# Patient Record
Sex: Female | Born: 1965 | Race: Black or African American | Hispanic: No | Marital: Single | Smoking: Never smoker
Health system: Southern US, Community
[De-identification: ages and names within clinical notes are randomized; demographics above are authoritative.]

## PROBLEM LIST (undated history)

## (undated) DIAGNOSIS — I471 Supraventricular tachycardia, unspecified: Secondary | ICD-10-CM

## (undated) DIAGNOSIS — D259 Leiomyoma of uterus, unspecified: Secondary | ICD-10-CM

## (undated) DIAGNOSIS — I10 Essential (primary) hypertension: Secondary | ICD-10-CM

## (undated) DIAGNOSIS — M549 Dorsalgia, unspecified: Secondary | ICD-10-CM

## (undated) DIAGNOSIS — B019 Varicella without complication: Secondary | ICD-10-CM

## (undated) DIAGNOSIS — D500 Iron deficiency anemia secondary to blood loss (chronic): Secondary | ICD-10-CM

## (undated) DIAGNOSIS — N3090 Cystitis, unspecified without hematuria: Secondary | ICD-10-CM

## (undated) HISTORY — DX: Dorsalgia, unspecified: M54.9

## (undated) HISTORY — PX: OTHER SURGICAL HISTORY: SHX169

## (undated) HISTORY — PX: ABDOMINAL HYSTERECTOMY: SHX81

## (undated) HISTORY — DX: Cystitis, unspecified without hematuria: N30.90

## (undated) HISTORY — DX: Varicella without complication: B01.9

---

## 2003-03-21 ENCOUNTER — Other Ambulatory Visit: Payer: Self-pay

## 2004-03-11 ENCOUNTER — Emergency Department: Payer: Self-pay | Admitting: Emergency Medicine

## 2004-12-17 ENCOUNTER — Emergency Department: Payer: Self-pay | Admitting: Emergency Medicine

## 2004-12-17 ENCOUNTER — Other Ambulatory Visit: Payer: Self-pay

## 2005-01-09 ENCOUNTER — Other Ambulatory Visit: Payer: Self-pay

## 2005-01-09 ENCOUNTER — Emergency Department: Payer: Self-pay | Admitting: Internal Medicine

## 2005-12-13 ENCOUNTER — Emergency Department: Payer: Self-pay

## 2006-12-14 ENCOUNTER — Emergency Department: Payer: Self-pay | Admitting: Emergency Medicine

## 2006-12-14 ENCOUNTER — Other Ambulatory Visit: Payer: Self-pay

## 2007-06-02 ENCOUNTER — Emergency Department: Payer: Self-pay | Admitting: Emergency Medicine

## 2007-07-18 ENCOUNTER — Emergency Department: Payer: Self-pay | Admitting: Emergency Medicine

## 2008-10-04 ENCOUNTER — Encounter (INDEPENDENT_AMBULATORY_CARE_PROVIDER_SITE_OTHER): Payer: Self-pay | Admitting: *Deleted

## 2010-08-30 ENCOUNTER — Encounter: Payer: Self-pay | Admitting: Cardiovascular Disease

## 2010-09-23 ENCOUNTER — Observation Stay: Payer: Self-pay | Admitting: Internal Medicine

## 2010-10-10 ENCOUNTER — Ambulatory Visit: Payer: Self-pay | Admitting: Internal Medicine

## 2011-10-20 ENCOUNTER — Ambulatory Visit: Payer: Self-pay | Admitting: General Surgery

## 2011-10-20 LAB — POTASSIUM: Potassium: 3.4 mmol/L — ABNORMAL LOW (ref 3.5–5.1)

## 2011-10-20 LAB — PREGNANCY, URINE: Pregnancy Test, Urine: NEGATIVE m[IU]/mL

## 2011-11-28 ENCOUNTER — Emergency Department: Payer: Self-pay | Admitting: Emergency Medicine

## 2011-11-28 LAB — COMPREHENSIVE METABOLIC PANEL
Albumin: 3.5 g/dL (ref 3.4–5.0)
Anion Gap: 6 — ABNORMAL LOW (ref 7–16)
Bilirubin,Total: 0.3 mg/dL (ref 0.2–1.0)
Calcium, Total: 8.7 mg/dL (ref 8.5–10.1)
Co2: 27 mmol/L (ref 21–32)
EGFR (Non-African Amer.): >60
Glucose: 136 mg/dL — ABNORMAL HIGH (ref 65–99)
Osmolality: 279 (ref 275–301)
Potassium: 3.5 mmol/L (ref 3.5–5.1)
SGOT(AST): 20 U/L (ref 15–37)
SGPT (ALT): 29 U/L (ref 12–78)
Sodium: 139 mmol/L (ref 136–145)

## 2011-11-28 LAB — URINALYSIS, COMPLETE
Bilirubin,UR: NEGATIVE
Glucose,UR: NEGATIVE mg/dL (ref 0–75)
Ketone: NEGATIVE
Nitrite: NEGATIVE
Ph: 6 (ref 4.5–8.0)
Specific Gravity: 1.012 (ref 1.003–1.030)
Squamous Epithelial: 1

## 2011-11-28 LAB — CBC
HGB: 13.1 g/dL (ref 12.0–16.0)
MCH: 27.6 pg (ref 26.0–34.0)
MCHC: 32.8 g/dL (ref 32.0–36.0)
Platelet: 324 10*3/uL (ref 150–440)
RBC: 4.74 10*6/uL (ref 3.80–5.20)
RDW: 15.3 % — ABNORMAL HIGH (ref 11.5–14.5)

## 2011-12-13 ENCOUNTER — Emergency Department: Payer: Self-pay | Admitting: Emergency Medicine

## 2012-04-08 ENCOUNTER — Ambulatory Visit: Payer: Self-pay | Admitting: Family Medicine

## 2012-04-08 DIAGNOSIS — Z0289 Encounter for other administrative examinations: Secondary | ICD-10-CM

## 2013-08-20 ENCOUNTER — Emergency Department: Payer: Self-pay | Admitting: Emergency Medicine

## 2013-08-20 LAB — COMPREHENSIVE METABOLIC PANEL
Albumin: 3.6 g/dL (ref 3.4–5.0)
Alkaline Phosphatase: 72 U/L
Anion Gap: 7 (ref 7–16)
BUN: 9 mg/dL (ref 7–18)
Bilirubin,Total: 0.2 mg/dL (ref 0.2–1.0)
Calcium, Total: 8.6 mg/dL (ref 8.5–10.1)
Chloride: 108 mmol/L — ABNORMAL HIGH (ref 98–107)
Co2: 24 mmol/L (ref 21–32)
Creatinine: 0.92 mg/dL (ref 0.60–1.30)
EGFR (African American): >60
GLUCOSE: 109 mg/dL — AB (ref 65–99)
Osmolality: 277 (ref 275–301)
Potassium: 3.7 mmol/L (ref 3.5–5.1)
SGOT(AST): 25 U/L (ref 15–37)
SGPT (ALT): 16 U/L (ref 12–78)
SODIUM: 139 mmol/L (ref 136–145)
Total Protein: 7.6 g/dL (ref 6.4–8.2)

## 2013-08-20 LAB — URINALYSIS, COMPLETE
BILIRUBIN, UR: NEGATIVE
GLUCOSE, UR: NEGATIVE mg/dL (ref 0–75)
Leukocyte Esterase: NEGATIVE
NITRITE: NEGATIVE
Ph: 5 (ref 4.5–8.0)
Protein: 30
Specific Gravity: 1.04 (ref 1.003–1.030)
Squamous Epithelial: 5

## 2013-08-20 LAB — CBC WITH DIFFERENTIAL/PLATELET
Basophil #: 0 10*3/uL (ref 0.0–0.1)
Basophil %: 0.9 %
EOS PCT: 1.6 %
Eosinophil #: 0.1 10*3/uL (ref 0.0–0.7)
HCT: 36.8 % (ref 35.0–47.0)
HGB: 11.4 g/dL — ABNORMAL LOW (ref 12.0–16.0)
LYMPHS PCT: 44.3 %
Lymphocyte #: 2.4 10*3/uL (ref 1.0–3.6)
MCH: 24.7 pg — ABNORMAL LOW (ref 26.0–34.0)
MCHC: 31.1 g/dL — AB (ref 32.0–36.0)
MCV: 80 fL (ref 80–100)
MONOS PCT: 6.7 %
Monocyte #: 0.4 x10 3/mm (ref 0.2–0.9)
NEUTROS PCT: 46.5 %
Neutrophil #: 2.5 10*3/uL (ref 1.4–6.5)
PLATELETS: 338 10*3/uL (ref 150–440)
RBC: 4.62 10*6/uL (ref 3.80–5.20)
RDW: 18.1 % — ABNORMAL HIGH (ref 11.5–14.5)
WBC: 5.3 10*3/uL (ref 3.6–11.0)

## 2013-08-20 LAB — LIPASE, BLOOD: Lipase: 147 U/L (ref 73–393)

## 2013-08-21 LAB — GC/CHLAMYDIA PROBE AMP

## 2013-08-21 LAB — WET PREP, GENITAL

## 2014-03-23 ENCOUNTER — Emergency Department: Payer: Self-pay | Admitting: Emergency Medicine

## 2014-03-23 LAB — CBC
HCT: 36.1 % (ref 35.0–47.0)
HGB: 11.4 g/dL — AB (ref 12.0–16.0)
MCH: 25.4 pg — ABNORMAL LOW (ref 26.0–34.0)
MCHC: 31.7 g/dL — AB (ref 32.0–36.0)
MCV: 80 fL (ref 80–100)
Platelet: 351 10*3/uL (ref 150–440)
RBC: 4.51 10*6/uL (ref 3.80–5.20)
RDW: 22.9 % — ABNORMAL HIGH (ref 11.5–14.5)
WBC: 5.7 10*3/uL (ref 3.6–11.0)

## 2014-03-23 LAB — URINALYSIS, COMPLETE
BILIRUBIN, UR: NEGATIVE
Bacteria: NONE SEEN
Glucose,UR: NEGATIVE mg/dL (ref 0–75)
KETONE: NEGATIVE
Leukocyte Esterase: NEGATIVE
Nitrite: NEGATIVE
Ph: 5 (ref 4.5–8.0)
Protein: 30
RBC,UR: 3274 /HPF (ref 0–5)
Specific Gravity: 1.036 (ref 1.003–1.030)

## 2014-03-23 LAB — BASIC METABOLIC PANEL
ANION GAP: 6 — AB (ref 7–16)
BUN: 8 mg/dL (ref 7–18)
CO2: 28 mmol/L (ref 21–32)
CREATININE: 0.78 mg/dL (ref 0.60–1.30)
Calcium, Total: 8.2 mg/dL — ABNORMAL LOW (ref 8.5–10.1)
Chloride: 107 mmol/L (ref 98–107)
EGFR (Non-African Amer.): >60
GLUCOSE: 64 mg/dL — AB (ref 65–99)
Osmolality: 278 (ref 275–301)
Potassium: 3.3 mmol/L — ABNORMAL LOW (ref 3.5–5.1)
SODIUM: 141 mmol/L (ref 136–145)

## 2014-07-04 NOTE — Op Note (Signed)
PATIENT NAME:  Kaitlin Brown, Kaitlin Brown MR#:  [id number] DATE OF BIRTH:  22-Jan-1966  DATE OF PROCEDURE:  10/20/2011  PREOPERATIVE DIAGNOSIS: Umbilical hernia.   POSTOPERATIVE DIAGNOSIS: Umbilical hernia.   PROCEDURE: Repair of umbilical hernia primary repair.   SURGEON: S.G. Jamal Collin, MD  ANESTHESIA: Monitored care with use of local anesthetic of 0.5% Marcaine mixed with 1% Xylocaine.   COMPLICATIONS: None.   ESTIMATED BLOOD LOSS: Minimal.   DRAINS: None.   DESCRIPTION OF PROCEDURE: Patient was placed in supine position on the operating table. The abdomen around the umbilicus was prepped and draped out as a sterile field. With adequate IV sedation monitoring, local anesthetic was instilled overlying the upper lip of the umbilicus where the hernia was palpated. The skin incision was then made along the superior aspect of the umbilicus transversely and carefully deepened through and dissected out to expose what appeared to be a less than 1 cm size fascial defect with what appeared to be just primarily preperitoneal tissue and a small sac. This was excised out and palpation through the fascial defect with the little finger inserted all around showed no other defects. The defect was then primarily closed with two stitches of 0 Prolene and did not require any occasional reinforcement. Subcutaneous tissue was closed with 3-0 Vicryl and the skin closed with subcuticular 4-0 Vicryl, covered with Dermabond. Procedure was well tolerated. She was subsequently returned to recovery room in stable condition.  ____________________________ S.Robinette Haines, MD sgs:cms D: 10/20/2011 08:27:08 ET T: 10/20/2011 10:03:57 ET  JOB#: [id number] cc: Synthia Innocent. Jamal Collin, MD, <Dictator> [facility] Robinette Haines MD ELECTRONICALLY SIGNED 10/20/2011 14:51

## 2014-07-20 ENCOUNTER — Emergency Department
Admission: EM | Admit: 2014-07-20 | Discharge: 2014-07-20 | Disposition: A | Discharge status: Home / Self Care | Payer: Self-pay | Attending: Emergency Medicine | Admitting: Emergency Medicine

## 2014-07-20 ENCOUNTER — Encounter: Payer: Self-pay | Admitting: *Deleted

## 2014-07-20 DIAGNOSIS — K2970 Gastritis, unspecified, without bleeding: Secondary | ICD-10-CM | POA: Insufficient documentation

## 2014-07-20 DIAGNOSIS — Z79899 Other long term (current) drug therapy: Secondary | ICD-10-CM | POA: Insufficient documentation

## 2014-07-20 DIAGNOSIS — I10 Essential (primary) hypertension: Secondary | ICD-10-CM | POA: Insufficient documentation

## 2014-07-20 DIAGNOSIS — Z3202 Encounter for pregnancy test, result negative: Secondary | ICD-10-CM | POA: Insufficient documentation

## 2014-07-20 HISTORY — DX: Essential (primary) hypertension: I10

## 2014-07-20 LAB — COMPREHENSIVE METABOLIC PANEL
ALBUMIN: 4.3 g/dL (ref 3.5–5.0)
ALK PHOS: 65 U/L (ref 38–126)
ALT: 18 U/L (ref 14–54)
AST: 20 U/L (ref 15–41)
Anion gap: 8 (ref 5–15)
BILIRUBIN TOTAL: 0.6 mg/dL (ref 0.3–1.2)
BUN: 8 mg/dL (ref 6–20)
CHLORIDE: 103 mmol/L (ref 101–111)
CO2: 24 mmol/L (ref 22–32)
Calcium: 9 mg/dL (ref 8.9–10.3)
Creatinine, Ser: 0.61 mg/dL (ref 0.44–1.00)
GFR calc Af Amer: >60 mL/min (ref >60)
GFR calc non Af Amer: >60 mL/min (ref >60)
Glucose, Bld: 109 mg/dL — ABNORMAL HIGH (ref 65–99)
Potassium: 3.6 mmol/L (ref 3.5–5.1)
Sodium: 135 mmol/L (ref 135–145)
Total Protein: 8.1 g/dL (ref 6.5–8.1)

## 2014-07-20 LAB — CBC WITH DIFFERENTIAL/PLATELET
Basophils Absolute: 0 10*3/uL (ref 0–0.1)
Basophils Relative: 1 %
Eosinophils Absolute: 0.1 10*3/uL (ref 0–0.7)
Eosinophils Relative: 2 %
HCT: 35.2 % (ref 35.0–47.0)
HEMOGLOBIN: 10.9 g/dL — AB (ref 12.0–16.0)
Lymphocytes Relative: 36 %
Lymphs Abs: 1.9 10*3/uL (ref 1.0–3.6)
MCH: 22.7 pg — ABNORMAL LOW (ref 26.0–34.0)
MCHC: 30.9 g/dL — AB (ref 32.0–36.0)
MCV: 73.4 fL — ABNORMAL LOW (ref 80.0–100.0)
MONOS PCT: 10 %
Monocytes Absolute: 0.5 10*3/uL (ref 0.2–0.9)
NEUTROS PCT: 51 %
Neutro Abs: 2.8 10*3/uL (ref 1.4–6.5)
PLATELETS: 507 10*3/uL — AB (ref 150–440)
RBC: 4.79 MIL/uL (ref 3.80–5.20)
RDW: 16.9 % — ABNORMAL HIGH (ref 11.5–14.5)
WBC: 5.3 10*3/uL (ref 3.6–11.0)

## 2014-07-20 LAB — URINALYSIS COMPLETE WITH MICROSCOPIC (ARMC ONLY)
Bilirubin Urine: NEGATIVE
Glucose, UA: NEGATIVE mg/dL
HGB URINE DIPSTICK: NEGATIVE
LEUKOCYTES UA: NEGATIVE
NITRITE: NEGATIVE
PH: 5 (ref 5.0–8.0)
PROTEIN: NEGATIVE mg/dL
SPECIFIC GRAVITY, URINE: 1.023 (ref 1.005–1.030)

## 2014-07-20 LAB — LIPASE, BLOOD: Lipase: 30 U/L (ref 22–51)

## 2014-07-20 MED ORDER — ONDANSETRON HCL 4 MG PO TABS: 4.0000 mg | ORAL_TABLET | Freq: Every day | ORAL | Status: DC | PRN

## 2014-07-20 NOTE — ED Notes (Signed)
Pt informed to return if life threatening symptoms occur.   

## 2014-07-20 NOTE — Discharge Instructions (Signed)
Gastritis, Adult °Gastritis is soreness and puffiness (inflammation) of the lining of the stomach. If you do not get help, gastritis can cause bleeding and sores (ulcers) in the stomach. °HOME CARE  °· Only take medicine as told by your doctor. °· If you were given antibiotic medicines, take them as told. Finish the medicines even if you start to feel better. °· Drink enough fluids to keep your pee (urine) clear or pale yellow. °· Avoid foods and drinks that make your problems worse. Foods you may want to avoid include: °¨ Caffeine or alcohol. °¨ Chocolate. °¨ Mint. °¨ Garlic and onions. °¨ Spicy foods. °¨ Citrus fruits, including oranges, lemons, or limes. °¨ Food containing tomatoes, including sauce, chili, salsa, and pizza. °¨ Fried and fatty foods. °· Eat small meals throughout the day instead of large meals. °GET HELP RIGHT AWAY IF:  °· You have black or dark red poop (stools). °· You throw up (vomit) blood. It may look like coffee grounds. °· You cannot keep fluids down. °· Your belly (abdominal) pain gets worse. °· You have a fever. °· You do not feel better after 1 week. °· You have any other questions or concerns. °MAKE SURE YOU:  °· Understand these instructions. °· Will watch your condition. °· Will get help right away if you are not doing well or get worse. °Document Released: 08/20/2007 Document Revised: 05/26/2011 Document Reviewed: 04/16/2011 °ExitCare® Patient Information ©2015 [employer]. This information is not intended to replace advice given to you by your health care provider. Make sure you discuss any questions you have with your health care provider. ° °

## 2014-07-20 NOTE — ED Provider Notes (Signed)
[facility] Emergency Department Provider Note    ____________________________________________  Time seen: 2 PM  I have reviewed the triage vital signs and the nursing notes.   HISTORY  Chief Complaint Abdominal Pain    HPI Kaitlin Brown is a 49 y.o. female who presents with vague diffuse and mild abdominal cramping for 2 days with occasional episodes of nausea. She denies vomiting. Currently she has no discomfort and no nausea she feels very well. She has no diarrhea her stools are normal. He has no history of the same. She has not taken any medications for this. When she had the discomfort food made it worse however she has none now.     Past Medical History  Diagnosis Date  . Back pain   . Bladder infection   . Chicken pox   . Hypertension     There are no active problems to display for this patient.   History reviewed. No pertinent past surgical history.  Current Outpatient Rx  Name  Route  Sig  Dispense  Refill  . lisinopril-hydrochlorothiazide (PRINZIDE,ZESTORETIC) 10-12.5 MG per tablet   Oral   Take 1 tablet by mouth daily.           Allergies Review of patient's allergies indicates no known allergies.  Family History  Problem Relation Age of Onset  . Hypertension Mother   . Diabetes Mother   . Glaucoma Mother   . Heart disease Father     Congestive heart condition    Social History History  Substance Use Topics  . Smoking status: Never Smoker   . Smokeless tobacco: Not on file  . Alcohol Use: Yes     Comment: occasionally    Review of Systems  Constitutional: Negative for fever. Eyes: Negative for visual changes. ENT: Negative for sore throat. Cardiovascular: Negative for chest pain. Respiratory: Negative for shortness of breath. Gastrointestinal: Negative for abdominal pain, vomiting and diarrhea currently Genitourinary: Negative for dysuria. Musculoskeletal: Negative for back pain. Skin:  Negative for rash. Neurological: Negative for headaches, focal weakness or numbness.   10-point ROS otherwise negative.  ____________________________________________   PHYSICAL EXAM:  VITAL SIGNS: ED Triage Vitals  Enc Vitals Group     BP 07/20/14 1250 146/85 mmHg     Pulse Rate 07/20/14 1250 94     Resp --      Temp 07/20/14 1250 98.2 F (36.8 C)     Temp Source 07/20/14 1250 Oral     SpO2 07/20/14 1250 94 %     Weight 07/20/14 1250 225 lb (102.059 kg)     Height 07/20/14 1250 5\' 6"  (1.676 m)     Head Cir --      Peak Flow --      Pain Score 07/20/14 1245 0     Pain Loc --      Pain Edu? --      Excl. in [place]? --      Constitutional: Alert and oriented. Well appearing and in no distress. Eyes: Conjunctivae are normal. PERRL. Normal extraocular movements. ENT   Head: Normocephalic and atraumatic.   Nose: No congestion/rhinnorhea.   Mouth/Throat: Mucous membranes are moist.   Neck: No stridor. Hematological/Lymphatic/Immunilogical: No cervical lymphadenopathy. Cardiovascular: Normal rate, regular rhythm. Normal and symmetric distal pulses are present in all extremities. No murmurs, rubs, or gallops. Respiratory: Normal respiratory effort without tachypnea nor retractions. Breath sounds are clear and equal bilaterally. No wheezes/rales/rhonchi. Gastrointestinal: Soft and nontender. No distention. There is no CVA  tenderness. Genitourinary: Deferred Musculoskeletal: Nontender with normal range of motion in all extremities. No joint effusions.  No lower extremity tenderness nor edema. Neurologic:  Normal speech and language. No gross focal neurologic deficits are appreciated. Speech is normal. No gait instability. Skin:  Skin is warm, dry and intact. No rash noted. Psychiatric: Mood and affect are normal. Speech and behavior are normal. Patient exhibits appropriate insight and judgment.  ____________________________________________    LABS (pertinent  positives/negatives)  Benign  ____________________________________________   EKG  None  ____________________________________________    RADIOLOGY  None  ____________________________________________   PROCEDURES  Procedure(s) performed: None  Critical Care performed: No  ____________________________________________   INITIAL IMPRESSION / ASSESSMENT AND PLAN / ED COURSE  Pertinent labs & imaging results that were available during my care of the patient were reviewed by me and considered in my medical decision making (see chart for details).  Initial impression is the patient is very well-appearing has no tenderness to palpation. Given that she has no physical complaints in the ED today if labs look normal she will be discharged for follow up primary care physician.  ____________________________________________   FINAL CLINICAL IMPRESSION(S) / ED DIAGNOSES  Final diagnoses:  Gastritis     Lavonia Drafts, MD 07/20/14 [phone]

## 2014-07-20 NOTE — ED Notes (Addendum)
Pt complains of diffuse abdominal pain with nausea times two days

## 2014-07-22 LAB — POCT PREGNANCY, URINE: Preg Test, Ur: NEGATIVE

## 2014-09-18 ENCOUNTER — Encounter: Payer: Self-pay | Admitting: Emergency Medicine

## 2014-09-18 ENCOUNTER — Emergency Department: Payer: Self-pay

## 2014-09-18 ENCOUNTER — Emergency Department
Admission: EM | Admit: 2014-09-18 | Discharge: 2014-09-18 | Disposition: A | Discharge status: Home / Self Care | Payer: Self-pay

## 2014-09-18 ENCOUNTER — Emergency Department
Admission: EM | Admit: 2014-09-18 | Discharge: 2014-09-18 | Disposition: A | Discharge status: Home / Self Care | Payer: Self-pay | Attending: Emergency Medicine | Admitting: Emergency Medicine

## 2014-09-18 DIAGNOSIS — Y9389 Activity, other specified: Secondary | ICD-10-CM | POA: Insufficient documentation

## 2014-09-18 DIAGNOSIS — S60032A Contusion of left middle finger without damage to nail, initial encounter: Secondary | ICD-10-CM | POA: Insufficient documentation

## 2014-09-18 DIAGNOSIS — Z79899 Other long term (current) drug therapy: Secondary | ICD-10-CM | POA: Insufficient documentation

## 2014-09-18 DIAGNOSIS — Y9289 Other specified places as the place of occurrence of the external cause: Secondary | ICD-10-CM | POA: Insufficient documentation

## 2014-09-18 DIAGNOSIS — R52 Pain, unspecified: Secondary | ICD-10-CM

## 2014-09-18 DIAGNOSIS — I10 Essential (primary) hypertension: Secondary | ICD-10-CM | POA: Insufficient documentation

## 2014-09-18 DIAGNOSIS — Y998 Other external cause status: Secondary | ICD-10-CM | POA: Insufficient documentation

## 2014-09-18 MED ORDER — CEPHALEXIN 500 MG PO CAPS: 500.0000 mg | ORAL_CAPSULE | Freq: Three times a day (TID) | ORAL | Status: AC

## 2014-09-18 MED ORDER — IBUPROFEN 800 MG PO TABS: 800.0000 mg | ORAL_TABLET | Freq: Three times a day (TID) | ORAL | Status: DC | PRN

## 2014-09-18 NOTE — ED Notes (Signed)
Pt called for triage. No answer.

## 2014-09-18 NOTE — ED Notes (Signed)
Pt called for traige no answer
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
## 2014-09-18 NOTE — ED Notes (Addendum)
Pt called for triage. No answer.

## 2014-09-18 NOTE — Discharge Instructions (Signed)
Take medication as prescribed. Keep clean. Apply ice and elevate.wear finger splint for 2-3 days or as long as pain continues.   Follow-up with primary care physician or orthopedic as needed for continued pain. Return to ER for new or worsening concerns.  Contusion A contusion is a deep bruise. Contusions are the result of an injury that caused bleeding under the skin. The contusion may turn blue, purple, or yellow. Minor injuries will give you a painless contusion, but more severe contusions may stay painful and swollen for a few weeks.  CAUSES  A contusion is usually caused by a blow, trauma, or direct force to an area of the body. SYMPTOMS   Swelling and redness of the injured area.  Bruising of the injured area.  Tenderness and soreness of the injured area.  Pain. DIAGNOSIS  The diagnosis can be made by taking a history and physical exam. An X-ray, CT scan, or MRI may be needed to determine if there were any associated injuries, such as fractures. TREATMENT  Specific treatment will depend on what area of the body was injured. In general, the best treatment for a contusion is resting, icing, elevating, and applying cold compresses to the injured area. Over-the-counter medicines may also be recommended for pain control. Ask your caregiver what the best treatment is for your contusion. HOME CARE INSTRUCTIONS   Put ice on the injured area.  Put ice in a plastic bag.  Place a towel between your skin and the bag.  Leave the ice on for 15-20 minutes, 3-4 times a day, or as directed by your health care provider.  Only take over-the-counter or prescription medicines for pain, discomfort, or fever as directed by your caregiver. Your caregiver may recommend avoiding anti-inflammatory medicines (aspirin, ibuprofen, and naproxen) for 48 hours because these medicines may increase bruising.  Rest the injured area.  If possible, elevate the injured area to reduce swelling. SEEK IMMEDIATE  MEDICAL CARE IF:   You have increased bruising or swelling.  You have pain that is getting worse.  Your swelling or pain is not relieved with medicines. MAKE SURE YOU:   Understand these instructions.  Will watch your condition.  Will get help right away if you are not doing well or get worse. Document Released: 12/11/2004 Document Revised: 03/08/2013 Document Reviewed: 01/06/2011 [facility] Patient Information 2015 [place], [place]. This information is not intended to replace advice given to you by your health care provider. Make sure you discuss any questions you have with your health care provider.

## 2014-09-21 ENCOUNTER — Emergency Department: Payer: Self-pay

## 2014-09-21 ENCOUNTER — Emergency Department
Admission: EM | Admit: 2014-09-21 | Discharge: 2014-09-21 | Disposition: A | Discharge status: Home / Self Care | Payer: Self-pay | Attending: Student | Admitting: Student

## 2014-09-21 ENCOUNTER — Encounter: Payer: Self-pay | Admitting: *Deleted

## 2014-09-21 DIAGNOSIS — R05 Cough: Secondary | ICD-10-CM

## 2014-09-21 DIAGNOSIS — Z79899 Other long term (current) drug therapy: Secondary | ICD-10-CM | POA: Insufficient documentation

## 2014-09-21 DIAGNOSIS — J069 Acute upper respiratory infection, unspecified: Secondary | ICD-10-CM | POA: Insufficient documentation

## 2014-09-21 DIAGNOSIS — R059 Cough, unspecified: Secondary | ICD-10-CM

## 2014-09-21 DIAGNOSIS — I10 Essential (primary) hypertension: Secondary | ICD-10-CM | POA: Insufficient documentation

## 2014-09-21 MED ORDER — HYDROCOD POLST-CPM POLST ER 10-8 MG/5ML PO SUER
5.0000 mL | Freq: Once | ORAL | Status: AC
  Administered 2014-09-21: 5 mL via ORAL
  Filled 2014-09-21: qty 5

## 2014-09-21 MED ORDER — HYDROCOD POLST-CPM POLST ER 10-8 MG/5ML PO SUER: 5.0000 mL | Freq: Two times a day (BID) | ORAL | Status: DC

## 2014-09-21 NOTE — ED Notes (Signed)
Pt reports a cough for 3 days.  Nonproductive.  Using cough drops without relief.  No fever.  Pt also has a headache.  Alert.  Speech clear.

## 2014-09-21 NOTE — Discharge Instructions (Signed)
Cough, Adult   A cough is a reflex. It helps you clear your throat and airways. A cough can help heal your body. A cough can last 2 or 3 weeks (acute) or may last more than 8 weeks (chronic). Some common causes of a cough can include an infection, allergy, or a cold.  HOME CARE  · Only take medicine as told by your doctor.  · If given, take your medicines (antibiotics) as told. Finish them even if you start to feel better.  · Use a cold steam vaporizer or humidifier in your home. This can help loosen thick spit (secretions).  · Sleep so you are almost sitting up (semi-upright). Use pillows to do this. This helps reduce coughing.  · Rest as needed.  · Stop smoking if you smoke.  GET HELP RIGHT AWAY IF:  · You have yellowish-white fluid (pus) in your thick spit.  · Your cough gets worse.  · Your medicine does not reduce coughing, and you are losing sleep.  · You cough up blood.  · You have trouble breathing.  · Your pain gets worse and medicine does not help.  · You have a fever.  MAKE SURE YOU:   · Understand these instructions.  · Will watch your condition.  · Will get help right away if you are not doing well or get worse.  Document Released: 11/14/2010 Document Revised: 07/18/2013 Document Reviewed: 11/14/2010  [employer]® Patient Information ©2015 [employer]. This information is not intended to replace advice given to you by your health care provider. Make sure you discuss any questions you have with your health care provider.

## 2014-09-21 NOTE — ED Notes (Signed)
Patient noted a cough that started yesterday. States headache x 3 days on left parietal lobe of head. States headache is now gone but cough remains. Feels like her throat is sore from all the coughing and is unable to sleep.

## 2014-09-21 NOTE — ED Provider Notes (Signed)
[facility] Emergency Department Provider Note  ____________________________________________  Time seen: Approximately 8:47 PM  I have reviewed the triage vital signs and the nursing notes.   HISTORY  Chief Complaint Cough    HPI Kaitlin Brown is a 49 y.o. female patient state 3 days of a productive and intermittent nonproductive cough. Patient states she is unable to sleep secondary to the cough. Patient also states so through secondary to coughing. Patient states she had a headache on the left side which resolved using over-the-counter medications. Patient denies any fever with this complaint. Patient state cough increases in the supine position. Patient denies any pain. Patient stated there is no nausea vomiting diarrhea.   Past Medical History  Diagnosis Date  . Back pain   . Bladder infection   . Chicken pox   . Hypertension     There are no active problems to display for this patient.   No past surgical history on file.  Current Outpatient Rx  Name  Route  Sig  Dispense  Refill  . cephALEXin (KEFLEX) 500 MG capsule   Oral   Take 1 capsule (500 mg total) by mouth 3 (three) times daily.   21 capsule   0   . chlorpheniramine-HYDROcodone (TUSSIONEX PENNKINETIC ER) 10-8 MG/5ML SUER   Oral   Take 5 mLs by mouth 2 (two) times daily.   115 mL   0   . ibuprofen (ADVIL,MOTRIN) 800 MG tablet   Oral   Take 1 tablet (800 mg total) by mouth every 8 (eight) hours as needed for mild pain or moderate pain.   15 tablet   0   . lisinopril-hydrochlorothiazide (PRINZIDE,ZESTORETIC) 10-12.5 MG per tablet   Oral   Take 1 tablet by mouth daily.         . ondansetron (ZOFRAN) 4 MG tablet   Oral   Take 1 tablet (4 mg total) by mouth daily as needed for nausea or vomiting.   20 tablet   1     Allergies Review of patient's allergies indicates no known allergies.  Family History  Problem Relation Age of Onset  . Hypertension Mother    . Diabetes Mother   . Glaucoma Mother   . Heart disease Father     Congestive heart condition    Social History History  Substance Use Topics  . Smoking status: Never Smoker   . Smokeless tobacco: Not on file  . Alcohol Use: Yes     Comment: occasionally    Review of Systems Constitutional: No fever/chills Eyes: No visual changes. ENT: Sore throat secondary to cough Cardiovascular: Denies chest pain. Respiratory: Denies shortness of breath. Nonproductive cough Gastrointestinal: No abdominal pain.  No nausea, no vomiting.  No diarrhea.  No constipation. Genitourinary: Negative for dysuria. Musculoskeletal: Negative for back pain. Skin: Negative for rash. Neurological: Negative for headaches, focal weakness or numbness. {10-point ROS otherwise negative.  ____________________________________________   PHYSICAL EXAM:  VITAL SIGNS: ED Triage Vitals  Enc Vitals Group     BP 09/21/14 1929 129/68 mmHg     Pulse Rate 09/21/14 1929 108     Resp 09/21/14 1929 20     Temp 09/21/14 1929 97.6 F (36.4 C)     Temp Source 09/21/14 1929 Oral     SpO2 09/21/14 1929 98 %     Weight 09/21/14 1929 222 lb (100.699 kg)     Height 09/21/14 1929 5\' 6"  (1.676 m)     Head Cir --  Peak Flow --      Pain Score 09/21/14 1930 0     Pain Loc --      Pain Edu? --      Excl. in [place]? --     Constitutional: Alert and oriented. Well appearing and in no acute distress. Eyes: Conjunctivae are normal. PERRL. EOMI. Head: Atraumatic. Nose: No congestion/rhinnorhea. Mouth/Throat: Mucous membranes are moist.  Oropharynx non-erythematous. Neck: No stridor. No cervical spine tenderness to palpation. Hematological/Lymphatic/Immunilogical: No cervical lymphadenopathy. Cardiovascular: Normal rate, regular rhythm. Grossly normal heart sounds.  Good peripheral circulation. Respiratory: Normal respiratory effort.  No retractions. Lungs CTAB. Gastrointestinal: Soft and nontender. No distention. No  abdominal bruits. No CVA tenderness. Musculoskeletal: No lower extremity tenderness nor edema.  No joint effusions. Neurologic:  Normal speech and language. No gross focal neurologic deficits are appreciated. Speech is normal. No gait instability. Skin:  Skin is warm, dry and intact. No rash noted. Psychiatric: Mood and affect are normal. Speech and behavior are normal.  ____________________________________________   LABS (all labs ordered are listed, but only abnormal results are displayed)  Labs Reviewed - No data to display ____________________________________________  EKG   ____________________________________________  RADIOLOGY  No acute findings on chest x-ray ____________________________________________   PROCEDURES  Procedure(s) performed: None  Critical Care performed: No  ____________________________________________   INITIAL IMPRESSION / ASSESSMENT AND PLAN / ED COURSE  Pertinent labs & imaging results that were available during my care of the patient were reviewed by me and considered in my medical decision making (see chart for details).  Upper respiratory infection. Patient advised on findings on chest x-ray. Patient given a prescription for Tussionex to take as directed revise it just affects medication. Patient advised follow-up family doctor if her condition persists. Patient denies direct ear condition worsens. ____________________________________________   FINAL CLINICAL IMPRESSION(S) / ED DIAGNOSES  Final diagnoses:  URI (upper respiratory infection)  Cough      Sable Feil, PA-C 09/21/14 2150  Joanne Gavel, MD 09/21/14 2320

## 2014-10-16 HISTORY — PX: HYSTEROSCOPY WITH D & C: SHX1775

## 2015-09-28 ENCOUNTER — Encounter: Payer: Self-pay | Admitting: Emergency Medicine

## 2015-09-28 ENCOUNTER — Emergency Department: Payer: PRIVATE HEALTH INSURANCE

## 2015-09-28 ENCOUNTER — Observation Stay
Admission: EM | Admit: 2015-09-28 | Discharge: 2015-09-29 | Disposition: A | Discharge status: Home / Self Care | Payer: PRIVATE HEALTH INSURANCE | Attending: Internal Medicine | Admitting: Internal Medicine

## 2015-09-28 DIAGNOSIS — M549 Dorsalgia, unspecified: Secondary | ICD-10-CM | POA: Diagnosis not present

## 2015-09-28 DIAGNOSIS — Z8249 Family history of ischemic heart disease and other diseases of the circulatory system: Secondary | ICD-10-CM | POA: Diagnosis not present

## 2015-09-28 DIAGNOSIS — R0600 Dyspnea, unspecified: Secondary | ICD-10-CM | POA: Diagnosis not present

## 2015-09-28 DIAGNOSIS — K429 Umbilical hernia without obstruction or gangrene: Secondary | ICD-10-CM | POA: Diagnosis not present

## 2015-09-28 DIAGNOSIS — D500 Iron deficiency anemia secondary to blood loss (chronic): Secondary | ICD-10-CM | POA: Diagnosis present

## 2015-09-28 DIAGNOSIS — Z83511 Family history of glaucoma: Secondary | ICD-10-CM | POA: Diagnosis not present

## 2015-09-28 DIAGNOSIS — N858 Other specified noninflammatory disorders of uterus: Secondary | ICD-10-CM | POA: Diagnosis present

## 2015-09-28 DIAGNOSIS — I10 Essential (primary) hypertension: Secondary | ICD-10-CM | POA: Insufficient documentation

## 2015-09-28 DIAGNOSIS — D649 Anemia, unspecified: Secondary | ICD-10-CM | POA: Diagnosis present

## 2015-09-28 DIAGNOSIS — N9489 Other specified conditions associated with female genital organs and menstrual cycle: Secondary | ICD-10-CM | POA: Diagnosis present

## 2015-09-28 DIAGNOSIS — Z833 Family history of diabetes mellitus: Secondary | ICD-10-CM | POA: Insufficient documentation

## 2015-09-28 DIAGNOSIS — R5383 Other fatigue: Secondary | ICD-10-CM | POA: Diagnosis present

## 2015-09-28 DIAGNOSIS — Z79899 Other long term (current) drug therapy: Secondary | ICD-10-CM | POA: Diagnosis not present

## 2015-09-28 DIAGNOSIS — D259 Leiomyoma of uterus, unspecified: Secondary | ICD-10-CM | POA: Insufficient documentation

## 2015-09-28 LAB — CBC WITH DIFFERENTIAL/PLATELET
Basophils Absolute: 0 10*3/uL (ref 0–0.1)
Basophils Relative: 1 %
Eosinophils Absolute: 0.1 10*3/uL (ref 0–0.7)
Eosinophils Relative: 3 %
HEMATOCRIT: 19.3 % — AB (ref 35.0–47.0)
HEMOGLOBIN: 5.5 g/dL — AB (ref 12.0–16.0)
LYMPHS PCT: 28 %
Lymphs Abs: 1.1 10*3/uL (ref 1.0–3.6)
MCH: 16.5 pg — AB (ref 26.0–34.0)
MCHC: 28.6 g/dL — AB (ref 32.0–36.0)
MCV: 57.8 fL — AB (ref 80.0–100.0)
MONOS PCT: 13 %
Monocytes Absolute: 0.5 10*3/uL (ref 0.2–0.9)
NEUTROS PCT: 55 %
Neutro Abs: 2.1 10*3/uL (ref 1.4–6.5)
Platelets: 610 10*3/uL — ABNORMAL HIGH (ref 150–440)
RBC: 3.34 MIL/uL — AB (ref 3.80–5.20)
RDW: 20.3 % — ABNORMAL HIGH (ref 11.5–14.5)
WBC: 3.9 10*3/uL (ref 3.6–11.0)

## 2015-09-28 LAB — WET PREP, GENITAL
CLUE CELLS WET PREP: NONE SEEN
Sperm: NONE SEEN
Trich, Wet Prep: NONE SEEN
WBC WET PREP: NONE SEEN
Yeast Wet Prep HPF POC: NONE SEEN

## 2015-09-28 LAB — POCT PREGNANCY, URINE: Preg Test, Ur: NEGATIVE

## 2015-09-28 LAB — CHLAMYDIA/NGC RT PCR (ARMC ONLY)
CHLAMYDIA TR: NOT DETECTED
N gonorrhoeae: NOT DETECTED

## 2015-09-28 LAB — URINALYSIS COMPLETE WITH MICROSCOPIC (ARMC ONLY)
BACTERIA UA: NONE SEEN
Bilirubin Urine: NEGATIVE
Glucose, UA: NEGATIVE mg/dL
Hgb urine dipstick: NEGATIVE
Ketones, ur: NEGATIVE mg/dL
Leukocytes, UA: NEGATIVE
Nitrite: NEGATIVE
Protein, ur: NEGATIVE mg/dL
Specific Gravity, Urine: 1.012 (ref 1.005–1.030)
pH: 6 (ref 5.0–8.0)

## 2015-09-28 LAB — PREPARE RBC (CROSSMATCH)

## 2015-09-28 LAB — BASIC METABOLIC PANEL
ANION GAP: 6 (ref 5–15)
BUN: 5 mg/dL — ABNORMAL LOW (ref 6–20)
CO2: 24 mmol/L (ref 22–32)
Calcium: 8.5 mg/dL — ABNORMAL LOW (ref 8.9–10.3)
Chloride: 107 mmol/L (ref 101–111)
Creatinine, Ser: 0.53 mg/dL (ref 0.44–1.00)
GFR calc Af Amer: >60 mL/min (ref >60)
GFR calc non Af Amer: >60 mL/min (ref >60)
GLUCOSE: 96 mg/dL (ref 65–99)
Potassium: 3.7 mmol/L (ref 3.5–5.1)
Sodium: 137 mmol/L (ref 135–145)

## 2015-09-28 LAB — ABO/RH: ABO/RH(D): A NEG

## 2015-09-28 LAB — POCT RAPID STREP A: Streptococcus, Group A Screen (Direct): NEGATIVE

## 2015-09-28 MED ORDER — DIATRIZOATE MEGLUMINE & SODIUM 66-10 % PO SOLN
15.0000 mL | Freq: Once | ORAL | Status: AC
  Administered 2015-09-28: 15 mL via ORAL

## 2015-09-28 MED ORDER — OXYCODONE HCL 5 MG PO TABS: 5.0000 mg | ORAL_TABLET | ORAL | Status: DC | PRN

## 2015-09-28 MED ORDER — HYDROCHLOROTHIAZIDE 12.5 MG PO CAPS
12.5000 mg | ORAL_CAPSULE | Freq: Every day | ORAL | Status: DC
  Administered 2015-09-28: 12.5 mg via ORAL
  Filled 2015-09-28: qty 1

## 2015-09-28 MED ORDER — MORPHINE SULFATE (PF) 2 MG/ML IV SOLN: 2.0000 mg | INTRAVENOUS | Status: DC | PRN

## 2015-09-28 MED ORDER — METOPROLOL SUCCINATE ER 25 MG PO TB24
25.0000 mg | ORAL_TABLET | Freq: Every day | ORAL | Status: DC
  Administered 2015-09-28: 25 mg via ORAL
  Filled 2015-09-28: qty 1

## 2015-09-28 MED ORDER — LISINOPRIL-HYDROCHLOROTHIAZIDE 10-12.5 MG PO TABS: 1.0000 | ORAL_TABLET | Freq: Every day | ORAL | Status: DC

## 2015-09-28 MED ORDER — SODIUM CHLORIDE 0.9 % IV SOLN
Freq: Once | INTRAVENOUS | Status: AC
  Administered 2015-09-28: 10 mL/h via INTRAVENOUS

## 2015-09-28 MED ORDER — SODIUM CHLORIDE 0.9 % IV BOLUS (SEPSIS)
1000.0000 mL | Freq: Once | INTRAVENOUS | Status: AC
  Administered 2015-09-28: 1000 mL via INTRAVENOUS

## 2015-09-28 MED ORDER — LISINOPRIL 10 MG PO TABS
10.0000 mg | ORAL_TABLET | Freq: Every day | ORAL | Status: DC
  Administered 2015-09-28: 10 mg via ORAL
  Filled 2015-09-28: qty 1

## 2015-09-28 MED ORDER — IOPAMIDOL (ISOVUE-300) INJECTION 61%
100.0000 mL | Freq: Once | INTRAVENOUS | Status: AC | PRN
  Administered 2015-09-28: 100 mL via INTRAVENOUS

## 2015-09-28 MED ORDER — ACETAMINOPHEN 325 MG PO TABS
650.0000 mg | ORAL_TABLET | Freq: Four times a day (QID) | ORAL | Status: DC | PRN
  Administered 2015-09-29: 650 mg via ORAL
  Filled 2015-09-28: qty 2

## 2015-09-28 MED ORDER — ACETAMINOPHEN 650 MG RE SUPP: 650.0000 mg | Freq: Four times a day (QID) | RECTAL | Status: DC | PRN

## 2015-09-28 NOTE — ED Provider Notes (Signed)
Evaluated the patient at the bedside at 2:30 PM.  She is resting comfortably at this time without any pain. She had been previously evaluated by the physician's assistant, Wynema Birch, who found the patient to have a hemoglobin of 5.5. The patient says that she has been feeling weak over the past 2 days. She also says that she has had heavy periods with silver dollar pancake-sized blood clots. Her last period was in early July. She denies any bleeding at this time and says that she has noticed a discharge. Says that the last time she had an OB/GYN workup was about 2 years ago and she was found to have fibroids at that time. She says that she has also noticed a "hardness" to the right lower part of her abdomen. Denies any pain. Was also found to be Hemoccult negative by Juanda Crumble beers.    On physical exam the patient has pale sclera.  Lower abdomen with palpable mass especially to the right side. Genitourinary:  Normal external exam.  Speculum exam with a small amount of clear discharge. No bleeding. Bimanual exam without CMT. Right-sided adnexal mass palpated without any suprapubic masses.      CT Abdomen Pelvis W Contrast (Final result) Result time: 09/28/15 17:35:44   Final result by Rad Results In Interface (09/28/15 17:35:44)   Narrative:   CLINICAL DATA: RIGHT upper quadrant and RIGHT lower quadrant pain, cough, congestion, headaches, fever, body aches and fatigue for 2 days  EXAM: CT ABDOMEN AND PELVIS WITH CONTRAST  TECHNIQUE: Multidetector CT imaging of the abdomen and pelvis was performed using the standard protocol following bolus administration of intravenous contrast. Sagittal and coronal MPR images reconstructed from axial data set.  CONTRAST: 152mL ISOVUE-300 IOPAMIDOL (ISOVUE-300) INJECTION 61% IV. Dilute oral contrast.  COMPARISON: None; correlation ultrasound pelvis 08/21/2013  FINDINGS: Lower chest: Minimal bibasilar atelectasis  Hepatobiliary: Mass with  nodular peripheral enhancement posterior RIGHT lobe liver 2.9 x 2.8 x 2.4 cm demonstrating significant fill-in on delayed images consistent with hepatic hemangioma. Liver and gallbladder otherwise normal appearance.  Pancreas: Normal appearance  Spleen: Normal appearance  Adrenals/Urinary Tract: Adrenal glands and kidneys normal appearance. Bladder and ureters normal appearance  Stomach/Bowel: Normal appendix. Stomach and bowel loops normal appearance  Vascular/Lymphatic: Vascular structures patent. No adenopathy.  Reproductive: Marked enlargement of the uterus. Heterogeneous mass at upper to mid uterine segment measuring 14.4 x 10.8 x 14.4 cm either representing marked interval enlargement of a uterine mass since 2015 versus hemorrhage into a pre-existing uterine lesion. Additional smaller leiomyoma 3.1 cm diameter LEFT lateral uterus. Adnexa unremarkable.  Other: Small amount of free pelvic fluid, low-attenuation. Small umbilical hernia containing fat. No free intraperitoneal air.  Musculoskeletal: Osseous structures unremarkable.  IMPRESSION: Marked enlargement of the uterus by a large mass at the upper to mid uterine segments, 14.4 x 14.4 x 10.8 cm in size with swirled heterogeneous internal attenuation either representing significant enlargement of a uterine mass such as leiomyoma or uterine sarcoma since the prior ultrasound from 2015 versus hemorrhage into a pre-existing uterine lesion.  Additional small LEFT lateral uterine leiomyoma.  Small probable hemangioma within RIGHT lobe liver 2.9 cm greatest size.  Small amount of nonspecific low-attenuation free pelvic fluid.  Small umbilical hernia containing fat.  Findings called to Dr. Clearnce Hasten On 09/28/2015 at 1733 hours.   Electronically Signed By: Lavonia Dana M.D. On: 09/28/2015 17:35       ----------------------------------------- 7:48 PM on  09/28/2015 -----------------------------------------  Patient with large uterine mass. Possibly bleeding into  this mass which caused her drop in hemoglobin. Discussed case Dr. Glennon Mac of OB/GYN. He states to optimize the patient's labs with blood transfusion and that the patient will likely require an outpatient surgery. Recommends admission to medicine. Signed out to Dr. Lavetta Nielsen. Up to the patient's her imaging and the plan of care and she is understanding and willing to comply.  Orbie Pyo, MD 09/28/15 [phone]

## 2015-09-28 NOTE — ED Notes (Signed)
Patient states that she has been having cough, chest congestion, headaches, fever, body aches, and fatigue for the past 2 days. Patient has had sick contacts at home, has also been visiting her mother in the hospital and in the nursing home. Patient states that she took some OTC allergy and sinus medication but it did not help. Patient does not appear to be in any distress at this time, breathing equal and unlabored.

## 2015-09-28 NOTE — ED Notes (Signed)
Pt transported to room 159A

## 2015-09-28 NOTE — ED Provider Notes (Signed)
[facility] Emergency Department Provider Note  ____________________________________________  Time seen: Approximately 12:07 PM  I have reviewed the triage vital signs and the nursing notes.   HISTORY  Chief Complaint URI    HPI Kaitlin Brown is a 50 y.o. female presents for evaluation of cough congestion headache fever bodyaches fatigue for the past couple days. Patient states she's got a sick contact at home as well as being normal rate [facility] daily and she may call something in there. Denies that she is pregnant however she does feel rundown no energy. Patient is also concerned about her head feeling dizzy lightheaded and off balance. Patient states that she feels short of breath at times.   Past Medical History  Diagnosis Date  . Back pain   . Bladder infection   . Chicken pox   . Hypertension     Patient Active Problem List   Diagnosis Date Noted  . Symptomatic anemia 09/28/2015  . Uterine mass 09/28/2015    History reviewed. No pertinent past surgical history.  Current Outpatient Rx  Name  Route  Sig  Dispense  Refill  . lisinopril-hydrochlorothiazide (PRINZIDE,ZESTORETIC) 10-12.5 MG per tablet   Oral   Take 1 tablet by mouth daily.         . metoprolol succinate (TOPROL-XL) 25 MG 24 hr tablet   Oral   Take 1 tablet by mouth daily.         . ferrous gluconate (FERGON) 324 MG tablet   Oral   Take 1 tablet (324 mg total) by mouth daily with breakfast.   30 tablet   3   . fluticasone (FLONASE) 50 MCG/ACT nasal spray   Each Nare   Place 1 spray into both nostrils daily.   16 g   2     Allergies Review of patient's allergies indicates no known allergies.  Family History  Problem Relation Age of Onset  . Hypertension Mother   . Diabetes Mother   . Glaucoma Mother   . Heart disease Father     Congestive heart condition    Social History Social History  Substance Use Topics  . Smoking status: Never  Smoker   . Smokeless tobacco: None  . Alcohol Use: Yes     Comment: occasionally    Review of Systems Constitutional:Denies fever/chills Eyes: No visual changes. ENT: Positive as sinus pressure and nasal congestion and sore throat. Cardiovascular: Denies chest pain. Respiratory: Denies shortness of breath. Positive for cough and chest congestion. Gastrointestinal: No abdominal pain.  No nausea, no vomiting.  No diarrhea.  No constipation. Genitourinary: Negative for dysuria. Musculoskeletal: Negative for back pain. Skin: Negative for rash. Neurological: Positive for headaches, denies any focal weakness or numbness just feels off balance.  10-point ROS otherwise negative.  ____________________________________________   PHYSICAL EXAM:  VITAL SIGNS: ED Triage Vitals  Enc Vitals Group     BP 09/28/15 1158 142/89 mmHg     Pulse Rate 09/28/15 1158 88     Resp 09/28/15 1158 16     Temp 09/28/15 1158 98.5 F (36.9 C)     Temp Source 09/28/15 1158 Oral     SpO2 09/28/15 1158 100 %     Weight 09/28/15 1158 215 lb (97.523 kg)     Height 09/28/15 1158 5\' 6"  (1.676 m)     Head Cir --      Peak Flow --      Pain Score 09/28/15 1154 10  Pain Loc --      Pain Edu? --      Excl. in [place]? --     Constitutional: Alert and oriented. Well appearing and in no acute distress. Head: Atraumatic. Nose: Positive congestion/rhinnorhea. Mouth/Throat: Mucous membranes are moist.  Oropharynx non-erythematous. Neck: No stridor.  Supple, full range of motion nontender Cardiovascular: Normal rate, regular rhythm. Grossly normal heart sounds.  Good peripheral circulation. Respiratory: Normal respiratory effort.  No retractions. Lungs CTAB. Gastrointestinal: Soft and nontender. No distention. Marland Kitchen No CVA tenderness. Rectal exam guaiac negative. Musculoskeletal: No lower extremity tenderness nor edema.  No joint effusions. Neurologic:  Normal speech and language. No gross focal neurologic deficits are  appreciated. No gait instability. Skin:  Skin is warm, dry and intact. No rash noted. Psychiatric: Mood and affect are normal. Speech and behavior are normal.  ____________________________________________   LABS (all labs ordered are listed, but only abnormal results are displayed)  Labs Reviewed  BASIC METABOLIC PANEL - Abnormal; Notable for the following:    BUN 5 (*)    Calcium 8.5 (*)    All other components within normal limits  CBC WITH DIFFERENTIAL/PLATELET - Abnormal; Notable for the following:    RBC 3.34 (*)    Hemoglobin 5.5 (*)    HCT 19.3 (*)    MCV 57.8 (*)    MCH 16.5 (*)    MCHC 28.6 (*)    RDW 20.3 (*)    Platelets 610 (*)    All other components within normal limits  URINALYSIS COMPLETEWITH MICROSCOPIC ([facility] ONLY) - Abnormal; Notable for the following:    Color, Urine YELLOW (*)    APPearance CLEAR (*)    Squamous Epithelial / LPF 0-5 (*)    All other components within normal limits  CBC - Abnormal; Notable for the following:    Hemoglobin 8.7 (*)    HCT 27.9 (*)    MCV 64.5 (*)    MCH 20.2 (*)    MCHC 31.3 (*)    RDW 25.8 (*)    Platelets 544 (*)    All other components within normal limits  CULTURE, GROUP A STREP ([place])  WET PREP, GENITAL  CHLAMYDIA/NGC RT PCR ([facility] ONLY)  POC URINE PREG, ED  POCT PREGNANCY, URINE  POCT RAPID STREP A  TYPE AND SCREEN  PREPARE RBC (CROSSMATCH)  ABO/RH   ____________________________________________  EKG   ____________________________________________  RADIOLOGY  No acute cardiopulmonary findings. ____________________________________________   PROCEDURES  Procedure(s) performed: None  Critical Care performed: No  ____________________________________________   INITIAL IMPRESSION / ASSESSMENT AND PLAN / ED COURSE  Pertinent labs & imaging results that were available during my care of the patient were reviewed by me and considered in my medical decision making (see chart for  details).  Anemia of unknown origin. Discussed all clinical findings with the attending the ED, Dr. Clearnce Hasten. We'll transfer patient over to room 16  for follow-up evaluation ____________________________________________   FINAL CLINICAL IMPRESSION(S) / ED DIAGNOSES  Final diagnoses:  Other fatigue  Uterine mass  Iron deficiency anemia due to chronic blood loss     This chart was dictated using voice recognition software/Dragon. Despite best efforts to proofread, errors can occur which can change the meaning. Any change was purely unintentional.   Arlyss Repress, PA-C 10/02/15 [place], MD 10/04/15 [phone]

## 2015-09-28 NOTE — H&P (Signed)
[facility] [place] NAME: Kaitlin Brown    MR#:  [id number]  DATE OF BIRTH:  11-Jun-1965   DATE OF ADMISSION:  09/28/2015  PRIMARY CARE PHYSICIAN: No PCP Per Patient   REQUESTING/REFERRING PHYSICIAN: [place]  CHIEF COMPLAINT:   Weakness  HISTORY OF PRESENT ILLNESS:  Kaitlin Brown  is a 50 y.o. female with a known history of Essential hypertension who is presenting with 2-3 day duration of weakness and fatigue. She also attests to having dyspnea on exertion denies any chest pain palpitations or further symptomatology. She states she has noticed having increased bleeding during menstrual cycles between now typically last 8 days as opposed to 5 days previously and she has been noticing large amounts of clots that have been passing with each cycle. Routine workup found be anemic hemoglobin 5.5  PAST MEDICAL HISTORY:   Past Medical History  Diagnosis Date  . Back pain   . Bladder infection   . Chicken pox   . Hypertension     PAST SURGICAL HISTORY:  History reviewed. No pertinent past surgical history.  SOCIAL HISTORY:   Social History  Substance Use Topics  . Smoking status: Never Smoker   . Smokeless tobacco: Not on file  . Alcohol Use: Yes     Comment: occasionally    FAMILY HISTORY:   Family History  Problem Relation Age of Onset  . Hypertension Mother   . Diabetes Mother   . Glaucoma Mother   . Heart disease Father     Congestive heart condition    DRUG ALLERGIES:  No Known Allergies  REVIEW OF SYSTEMS:  REVIEW OF SYSTEMS:  CONSTITUTIONAL: Denies fevers, chills, Positive fatigue, weakness.  EYES: Denies blurred vision, double vision, or eye pain.  EARS, NOSE, THROAT: Denies tinnitus, ear pain, hearing loss.  RESPIRATORY: denies cough, shortness of breath, wheezing  CARDIOVASCULAR: Denies chest pain, palpitations, edema.  GASTROINTESTINAL: Denies nausea, vomiting, diarrhea, abdominal pain.    GENITOURINARY: Denies dysuria, hematuria.  ENDOCRINE: Denies nocturia or thyroid problems. HEMATOLOGIC AND LYMPHATIC: Denies easy bruising or bleeding.  SKIN: Denies rash or lesions.  MUSCULOSKELETAL: Denies pain in neck, back, shoulder, knees, hips, or further arthritic symptoms.  NEUROLOGIC: Denies paralysis, paresthesias.  PSYCHIATRIC: Denies anxiety or depressive symptoms. Otherwise full review of systems performed by me is negative.   MEDICATIONS AT HOME:   Prior to Admission medications   Medication Sig Start Date End Date Taking? Authorizing Provider  lisinopril-hydrochlorothiazide (PRINZIDE,ZESTORETIC) 10-12.5 MG per tablet Take 1 tablet by mouth daily.   Yes Historical Provider, MD  metoprolol succinate (TOPROL-XL) 25 MG 24 hr tablet Take 1 tablet by mouth daily. 04/10/14  Yes Historical Provider, MD      VITAL SIGNS:  Blood pressure 132/80, pulse 84, temperature 98.5 F (36.9 C), temperature source Oral, resp. rate 16, height 5\' 6"  (1.676 m), weight 215 lb (97.523 kg), last menstrual period 09/11/2015, SpO2 94 %.  PHYSICAL EXAMINATION:  VITAL SIGNS: Filed Vitals:   09/28/15 1420 09/28/15 1630  BP: 146/92 132/80  Pulse: 89 84  Temp:    Resp: 19    GENERAL:50 y.o.female currently in no acute distress.  HEAD: Normocephalic, atraumatic.  EYES: Pupils equal, round, reactive to light. Extraocular muscles intact. No scleral icterus.  MOUTH: Moist mucosal membrane. Dentition intact. No abscess noted.  EAR, NOSE, THROAT: Clear without exudates. No external lesions.  NECK: Supple. No thyromegaly. No nodules. No JVD.  PULMONARY: Clear to ascultation, without wheeze rails or  rhonci. No use of accessory muscles, Good respiratory effort. good air entry bilaterally CHEST: Nontender to palpation.  CARDIOVASCULAR: S1 and S2. Regular rate and rhythm. No murmurs, rubs, or gallops. No edema. Pedal pulses 2+ bilaterally.  GASTROINTESTINAL: Soft, nontender, nondistended. No masses.  Positive bowel sounds. No hepatosplenomegaly.  MUSCULOSKELETAL: No swelling, clubbing, or edema. Range of motion full in all extremities.  NEUROLOGIC: Cranial nerves II through XII are intact. No gross focal neurological deficits. Sensation intact. Reflexes intact.  SKIN: No ulceration, lesions, rashes, or cyanosis. Skin warm and dry. Turgor intact.  PSYCHIATRIC: Mood, affect within normal limits. The patient is awake, alert and oriented x 3. Insight, judgment intact.    LABORATORY PANEL:   CBC  Recent Labs Lab 09/28/15 1310  WBC 3.9  HGB 5.5*  HCT 19.3*  PLT 610*   ------------------------------------------------------------------------------------------------------------------  Chemistries   Recent Labs Lab 09/28/15 1310  NA 137  K 3.7  CL 107  CO2 24  GLUCOSE 96  BUN 5*  CREATININE 0.53  CALCIUM 8.5*   ------------------------------------------------------------------------------------------------------------------  Cardiac Enzymes No results for input(s): TROPONINI in the last 168 hours. ------------------------------------------------------------------------------------------------------------------  RADIOLOGY:  Dg Chest 2 View  09/28/2015  CLINICAL DATA:  Productive cough. EXAM: CHEST  2 VIEW COMPARISON:  Radiographs of September 21, 2014. FINDINGS: The heart size and mediastinal contours are within normal limits. Both lungs are clear. No pneumothorax or pleural effusion is noted. The visualized skeletal structures are unremarkable. IMPRESSION: No active cardiopulmonary disease. Electronically Signed   By: Marijo Conception, M.D.   On: 09/28/2015 13:09   Ct Abdomen Pelvis W Contrast  09/28/2015  CLINICAL DATA:  RIGHT upper quadrant and RIGHT lower quadrant pain, cough, congestion, headaches, fever, body aches and fatigue for 2 days EXAM: CT ABDOMEN AND PELVIS WITH CONTRAST TECHNIQUE: Multidetector CT imaging of the abdomen and pelvis was performed using the standard  protocol following bolus administration of intravenous contrast. Sagittal and coronal MPR images reconstructed from axial data set. CONTRAST:  130mL ISOVUE-300 IOPAMIDOL (ISOVUE-300) INJECTION 61% IV. Dilute oral contrast. COMPARISON:  None; correlation ultrasound pelvis 08/21/2013 FINDINGS: Lower chest:  Minimal bibasilar atelectasis Hepatobiliary: Mass with nodular peripheral enhancement posterior RIGHT lobe liver 2.9 x 2.8 x 2.4 cm demonstrating significant fill-in on delayed images consistent with hepatic hemangioma. Liver and gallbladder otherwise normal appearance. Pancreas: Normal appearance Spleen: Normal appearance Adrenals/Urinary Tract: Adrenal glands and kidneys normal appearance. Bladder and ureters normal appearance Stomach/Bowel: Normal appendix. Stomach and bowel loops normal appearance Vascular/Lymphatic: Vascular structures patent.  No adenopathy. Reproductive: Marked enlargement of the uterus. Heterogeneous mass at upper to mid uterine segment measuring 14.4 x 10.8 x 14.4 cm either representing marked interval enlargement of a uterine mass since 2015 versus hemorrhage into a pre-existing uterine lesion. Additional smaller leiomyoma 3.1 cm diameter LEFT lateral uterus. Adnexa unremarkable. Other: Small amount of free pelvic fluid, low-attenuation. Small umbilical hernia containing fat. No free intraperitoneal air. Musculoskeletal: Osseous structures unremarkable. IMPRESSION: Marked enlargement of the uterus by a large mass at the upper to mid uterine segments, 14.4 x 14.4 x 10.8 cm in size with swirled heterogeneous internal attenuation either representing significant enlargement of a uterine mass such as leiomyoma or uterine sarcoma since the prior ultrasound from 2015 versus hemorrhage into a pre-existing uterine lesion. Additional small LEFT lateral uterine leiomyoma. Small probable hemangioma within RIGHT lobe liver 2.9 cm greatest size. Small amount of nonspecific low-attenuation free pelvic  fluid. Small umbilical hernia containing fat. Findings called to Dr. Clearnce Hasten On 09/28/2015 at  1733 hours. Electronically Signed   By: Lavonia Dana M.D.   On: 09/28/2015 17:35    EKG:   Orders placed or performed in visit on 10/20/11  . EKG 12-Lead    IMPRESSION AND PLAN:   50 year old African-American female history of hypertension presenting with fatigue  1. Symptomatic anemia: Transfuse, follow CBC 2. Uterine mass: Likely fibroid, consult gynecology 3. Essential hypertension continue lisinopril metoprolol    All the records are reviewed and case discussed with ED provider. Management plans discussed with the patient, family and they are in agreement.  CODE STATUS: Full  TOTAL TIME TAKING CARE OF THIS PATIENT: 33 minutes.    Vannak Montenegro,  Karenann Cai.D on 09/28/2015 at 7:54 PM  Between 7am to 6pm - Pager - [phone]  After 6pm: House Pager: - [phone]  [facility]  Office  [phone]  CC: Primary care physician; No PCP Per Patient

## 2015-09-28 NOTE — ED Notes (Signed)
Admitting MD at bedside.

## 2015-09-28 NOTE — Consult Note (Signed)
GYNECOLOGY CONSULT NOTE  GYN Consultation  Attending Provider: Lytle Butte, MD   Kaitlin Brown [id number] 09/28/2015 10:59 PM    Reason for Consultation:   Kaitlin Brown is a 50 y.o. G2P0 female seen for evaluation of uterine mass in the setting of profound symptomatic anemia.    History of Present Ilness:   She states that for the past two days she has been feeling tired and gets winded very easily.  She also reports some upper respiratory symptoms.  She states that for the past two years she has had heavier menses.  Her menses come regularly each month.  They now last about 10 days, whereas before they lasted about 5 days.  They are also heavier than before. She uses nothing for contraception.  She states that she had imaging here at [facility] about two years ago and was told that she had two small "things" on her uterus.  She reports ice pica.  She denies significant weight loss. Denies bloating and constipation.  She denies neurologic and respiratory complaints.  Her last pap smear was two years ago wand was reportedly normal.  She states that she has tried hormonal pills before to try to control her bleeding. However, her bleeding become quite irregular and she did not like that side effect.  She was admitted today from the ER for a hemoglobin of 5.5. She is receiving blood transfusions now.  Her LMP was 09/10/15.  She is having no vaginal bleeding now.   Past Medical History  Diagnosis Date  . Back pain   . Bladder infection   . Chicken pox   . Hypertension    Past Surgical History: umbilical hernia repair (she states there was no mesh placed)  Allergies: No Known Allergies   Prior to Admission medications   Medication Sig Start Date End Date Taking? Authorizing Provider  lisinopril-hydrochlorothiazide (PRINZIDE,ZESTORETIC) 10-12.5 MG per tablet Take 1 tablet by mouth daily.   Yes Historical Provider, MD  metoprolol succinate (TOPROL-XL) 25 MG 24 hr tablet Take 1  tablet by mouth daily. 04/10/14  Yes Historical Provider, MD    Obstetric History: She is a G2P0020 status post EAB x 2.   Gynecologic History:   Menarche age: 82 Patient's last menstrual period was 09/11/2015 (approximate). Cycle Hx: flow is excessive, cycles regular. Duration 10 days. Last pap: 2 years ago.  Reports that it was normal  Social History:  She  reports that she has never smoked. She does not have any smokeless tobacco history on file. She reports that she drinks alcohol.  Family History:  family history includes Diabetes in her mother; Glaucoma in her mother; Heart disease in her father; Hypertension in her mother.   Review of Systems:  Negative x 10 systems reviewed except as noted in the HPI.    Objective    BP 130/82 mmHg  Pulse 81  Temp(Src) 98.4 F (36.9 C) (Oral)  Resp 16  Ht 5\' 6"  (1.676 m)  Wt 200 lb 6.4 oz (90.901 kg)  BMI 32.36 kg/m2  SpO2 99%  LMP 09/11/2015 (Approximate) Physical Exam  General:  She is a well appearing female in no distress.  HEENT:  Normocephalic, atraumatic.   Neck:  supple, no lymphadenopathy Cardiac:  RRR Pulmonary:  Clear to auscultation bilaterally. No wheezes, rales, rhonchi.   Abdomen:  Soft, nontender, nondistended, +BS.  Large mass extending from pelvis to about 3cm above umbilicus. It is mobile and contour appears regular Pelvic:  Deferred. Extremities:  Non-tender, symmetric no edema bilaterally.   Neurologic:  Alert & oriented x 3.  Appropriate, conversant.    Laboratory Results:   Lab Results  Component Value Date   WBC 3.9 09/28/2015   RBC 3.34* 09/28/2015   HGB 5.5* 09/28/2015   HCT 19.3* 09/28/2015   PLT 610* 09/28/2015   NA 137 09/28/2015   K 3.7 09/28/2015   CREATININE 0.53 09/28/2015   Lab Results  Component Value Date   PREGTESTUR NEGATIVE 09/28/2015   Imaging Results:  Dg Chest 2 View  09/28/2015  CLINICAL DATA:  Productive cough. EXAM: CHEST  2 VIEW COMPARISON:  Radiographs of September 21, 2014.  FINDINGS: The heart size and mediastinal contours are within normal limits. Both lungs are clear. No pneumothorax or pleural effusion is noted. The visualized skeletal structures are unremarkable. IMPRESSION: No active cardiopulmonary disease. Electronically Signed   By: Marijo Conception, M.D.   On: 09/28/2015 13:09   Ct Abdomen Pelvis W Contrast  09/28/2015  CLINICAL DATA:  RIGHT upper quadrant and RIGHT lower quadrant pain, cough, congestion, headaches, fever, body aches and fatigue for 2 days EXAM: CT ABDOMEN AND PELVIS WITH CONTRAST TECHNIQUE: Multidetector CT imaging of the abdomen and pelvis was performed using the standard protocol following bolus administration of intravenous contrast. Sagittal and coronal MPR images reconstructed from axial data set. CONTRAST:  147mL ISOVUE-300 IOPAMIDOL (ISOVUE-300) INJECTION 61% IV. Dilute oral contrast. COMPARISON:  None; correlation ultrasound pelvis 08/21/2013 FINDINGS: Lower chest:  Minimal bibasilar atelectasis Hepatobiliary: Mass with nodular peripheral enhancement posterior RIGHT lobe liver 2.9 x 2.8 x 2.4 cm demonstrating significant fill-in on delayed images consistent with hepatic hemangioma. Liver and gallbladder otherwise normal appearance. Pancreas: Normal appearance Spleen: Normal appearance Adrenals/Urinary Tract: Adrenal glands and kidneys normal appearance. Bladder and ureters normal appearance Stomach/Bowel: Normal appendix. Stomach and bowel loops normal appearance Vascular/Lymphatic: Vascular structures patent.  No adenopathy. Reproductive: Marked enlargement of the uterus. Heterogeneous mass at upper to mid uterine segment measuring 14.4 x 10.8 x 14.4 cm either representing marked interval enlargement of a uterine mass since 2015 versus hemorrhage into a pre-existing uterine lesion. Additional smaller leiomyoma 3.1 cm diameter LEFT lateral uterus. Adnexa unremarkable. Other: Small amount of free pelvic fluid, low-attenuation. Small umbilical hernia  containing fat. No free intraperitoneal air. Musculoskeletal: Osseous structures unremarkable. IMPRESSION: Marked enlargement of the uterus by a large mass at the upper to mid uterine segments, 14.4 x 14.4 x 10.8 cm in size with swirled heterogeneous internal attenuation either representing significant enlargement of a uterine mass such as leiomyoma or uterine sarcoma since the prior ultrasound from 2015 versus hemorrhage into a pre-existing uterine lesion. Additional small LEFT lateral uterine leiomyoma. Small probable hemangioma within RIGHT lobe liver 2.9 cm greatest size. Small amount of nonspecific low-attenuation free pelvic fluid. Small umbilical hernia containing fat. Findings called to Dr. Clearnce Hasten On 09/28/2015 at 1733 hours. Electronically Signed   By: Lavonia Dana M.D.   On: 09/28/2015 17:35      Assessment & Recommendations   Kaitlin Brown is a 50 y.o. G7P0 female with profound anemia and a 15cm uterine mass being seen in consultation.  Differential diagnosis includes uterine leiomyoma, leiomyosarcoma.    Recommendations:  1.  Agree with blood transfusion 2.  Discussed potential treatments for heavy menstrual bleeding. She is most likely anemic at baseline.  There were no changes to her menses recently.  Discussed that this is most likely a fibroid.  However, we couldn't completely rule out leiomyosarcoma.  Discussed  treatment options, including; hysterectomy as the most definitive. Other options potentially include uterine artery embolization and ultrasound treatment.  Discussed that these treatments are not available here.  However, there are private clinics, [place] that offer such services if she wasn't interested in a hysterectomy.   Still, these treatments do not guarantee that a leiomyosarcoma would be found and treated.  All these treatment could be further explored with her as an outpatient.  Recommend follow up at [place] with me or [facility] or [place].  Will Bonnet, MD 09/28/2015 10:59 PM

## 2015-09-28 NOTE — ED Notes (Signed)
MD at bedside. 

## 2015-09-28 NOTE — ED Notes (Signed)
Report given to Kendall RN 

## 2015-09-29 LAB — CBC
HEMATOCRIT: 27.9 % — AB (ref 35.0–47.0)
Hemoglobin: 8.7 g/dL — ABNORMAL LOW (ref 12.0–16.0)
MCH: 20.2 pg — ABNORMAL LOW (ref 26.0–34.0)
MCHC: 31.3 g/dL — ABNORMAL LOW (ref 32.0–36.0)
MCV: 64.5 fL — ABNORMAL LOW (ref 80.0–100.0)
PLATELETS: 544 10*3/uL — AB (ref 150–440)
RBC: 4.32 MIL/uL (ref 3.80–5.20)
RDW: 25.8 % — AB (ref 11.5–14.5)
WBC: 5.4 10*3/uL (ref 3.6–11.0)

## 2015-09-29 MED ORDER — FLUTICASONE PROPIONATE 50 MCG/ACT NA SUSP: 1.0000 | Freq: Every day | NASAL | Status: DC

## 2015-09-29 MED ORDER — FLUTICASONE PROPIONATE 50 MCG/ACT NA SUSP
1.0000 | Freq: Every day | NASAL | Status: DC
  Administered 2015-09-29: 1 via NASAL
  Filled 2015-09-29: qty 16

## 2015-09-29 MED ORDER — FERROUS GLUCONATE 324 (38 FE) MG PO TABS: 324.0000 mg | ORAL_TABLET | Freq: Every day | ORAL | Status: DC

## 2015-09-29 NOTE — Progress Notes (Signed)
   [place], [place] [address]  September 29, 2015  Patient:  Kaitlin Brown Date of Birth: [birth year] Date of Visit:  09/28/2015  To Whom it May Concern:  Please excuse [name] from work from 09/28/2015 until 09/29/2015 as he was present for his loved one admitted to the [facility] for medical treatment. He may return to work on 09/30/15,     Please don't hesitate to contact me with questions or concerns by calling  [phone] and asking them to page me directly.   Regino Schultze, MD

## 2015-09-29 NOTE — Progress Notes (Signed)
Patient discharging home. Instructions and prescriptions given to patient. Verbalized understanding. Work notes printed and given to patient. Verbalized understanding. Family at bedside will provide transportation.

## 2015-09-29 NOTE — Progress Notes (Signed)
Pt refused SCD's. Reinforced education on blood clot prevention. Pt alert and oriented X4, up and about with no limitation in mobility.

## 2015-09-29 NOTE — Progress Notes (Signed)
New Admission Note:   Arrival Method: per bed from ED, pt came from home Mental Orientation: alert and oriented X4 Telemetry: none ordered Assessment: Completed Skin: warm, dry, intact, no existing wounds noted IV: G20 on the right AC with transparent dressing, intact Pain: denies any pain as of this time Safety Measures: Safety Fall Prevention Plan has been given and discussed Admission: Completed 1A Orientation: Patient has been orientated to the room, unit and staff.  Family: fiance at bedside  Orders have been reviewed and implemented. Will continue to monitor the patient. Call light has been placed within reach.  Georgeanna Harrison BSN, RN [facility] 1A

## 2015-09-29 NOTE — Discharge Summary (Signed)
[facility] NAME: Kaitlin Brown    MR#:  [id number]  DATE OF BIRTH:  03/11/1966  DATE OF ADMISSION:  09/28/2015 ADMITTING PHYSICIAN: Lytle Butte, MD  DATE OF DISCHARGE: 09/29/2015  PRIMARY CARE PHYSICIAN: No PCP Per Patient    ADMISSION DIAGNOSIS:  Iron deficiency anemia due to chronic blood loss [D50.0] Uterine mass [N94.89] Other fatigue [R53.83]  DISCHARGE DIAGNOSIS:  Active Problems:   Symptomatic anemia   Uterine mass   SECONDARY DIAGNOSIS:   Past Medical History  Diagnosis Date  . Back pain   . Bladder infection   . Chicken pox   . Hypertension     HOSPITAL COURSE:  Kaitlin Brown  is a 50 y.o. female admitted 09/28/2015 with chief complaint URI . Please see H&P performed by Lytle Butte, MD for further information. Patient presented with weakness and URI symptoms. Found to be anemic with Hgb 5.5, also found to have uterine mass on [place]. Given symptomatic anemia, transfused 3 unit PRBC with expected response. Evaluated by ob/gyn who recommend outpatient follow up. For URI symptoms - supportive care DISCHARGE CONDITIONS:   stable  CONSULTS OBTAINED:  Treatment Team:  Lytle Butte, MD  DRUG ALLERGIES:  No Known Allergies  DISCHARGE MEDICATIONS:   Current Discharge Medication List    START taking these medications   Details  ferrous gluconate (FERGON) 324 MG tablet Take 1 tablet (324 mg total) by mouth daily with breakfast. Qty: 30 tablet, Refills: 3    fluticasone (FLONASE) 50 MCG/ACT nasal spray Place 1 spray into both nostrils daily. Qty: 16 g, Refills: 2      CONTINUE these medications which have NOT CHANGED   Details  lisinopril-hydrochlorothiazide (PRINZIDE,ZESTORETIC) 10-12.5 MG per tablet Take 1 tablet by mouth daily.    metoprolol succinate (TOPROL-XL) 25 MG 24 hr tablet Take 1 tablet by mouth daily.         DISCHARGE INSTRUCTIONS:    DIET:  Regular diet  DISCHARGE  CONDITION:  Stable  ACTIVITY:  Activity as tolerated  OXYGEN:  Home Oxygen: No.   Oxygen Delivery: room air  DISCHARGE LOCATION:  home   If you experience worsening of your admission symptoms, develop shortness of breath, life threatening emergency, suicidal or homicidal thoughts you must seek medical attention immediately by calling 911 or calling your MD immediately  if symptoms less severe.  You Must read complete instructions/literature along with all the possible adverse reactions/side effects for all the Medicines you take and that have been prescribed to you. Take any new Medicines after you have completely understood and accpet all the possible adverse reactions/side effects.   Please note  You were cared for by a hospitalist during your hospital stay. If you have any questions about your discharge medications or the care you received while you were in the hospital after you are discharged, you can call the unit and asked to speak with the hospitalist on call if the hospitalist that took care of you is not available. Once you are discharged, your primary care physician will handle any further medical issues. Please note that NO REFILLS for any discharge medications will be authorized once you are discharged, as it is imperative that you return to your primary care physician (or establish a relationship with a primary care physician if you do not have one) for your aftercare needs so that they can reassess your need for medications and monitor your lab values.  On the day of Discharge:   VITAL SIGNS:  Blood pressure 115/67, pulse 76, temperature 98.3 F (36.8 C), temperature source Oral, resp. rate 18, height 5\' 6"  (1.676 m), weight 200 lb 6.4 oz (90.901 kg), last menstrual period 09/11/2015, SpO2 96 %.  I/O:   Intake/Output Summary (Last 24 hours) at 09/29/15 0803 Last data filed at 09/29/15 0741  Gross per 24 hour  Intake   1370 ml  Output      0 ml  Net   1370 ml     PHYSICAL EXAMINATION:  GENERAL:  50 y.o.-year-old patient lying in the bed with no acute distress.  EYES: Pupils equal, round, reactive to light and accommodation. No scleral icterus. Extraocular muscles intact.  HEENT: Head atraumatic, normocephalic. Oropharynx and nasopharynx clear.  NECK:  Supple, no jugular venous distention. No thyroid enlargement, no tenderness.  LUNGS: Normal breath sounds bilaterally, no wheezing, rales,rhonchi or crepitation. No use of accessory muscles of respiration.  CARDIOVASCULAR: S1, S2 normal. No murmurs, rubs, or gallops.  ABDOMEN: Soft, non-tender, non-distended. Bowel sounds present. No organomegaly or mass.  EXTREMITIES: No pedal edema, cyanosis, or clubbing.  NEUROLOGIC: Cranial nerves II through XII are intact. Muscle strength 5/5 in all extremities. Sensation intact. Gait not checked.  PSYCHIATRIC: The patient is alert and oriented x 3.  SKIN: No obvious rash, lesion, or ulcer.   DATA REVIEW:   CBC  Recent Labs Lab 09/29/15 0449  WBC 5.4  HGB 8.7*  HCT 27.9*  PLT 544*    Chemistries   Recent Labs Lab 09/28/15 1310  NA 137  K 3.7  CL 107  CO2 24  GLUCOSE 96  BUN 5*  CREATININE 0.53  CALCIUM 8.5*    Cardiac Enzymes No results for input(s): TROPONINI in the last 168 hours.  Microbiology Results  Results for orders placed or performed during the hospital encounter of 09/28/15  Wet prep, genital     Status: None   Collection Time: 09/28/15  3:30 PM  Result Value Ref Range Status   Yeast Wet Prep HPF POC NONE SEEN NONE SEEN Final   Trich, Wet Prep NONE SEEN NONE SEEN Final   Clue Cells Wet Prep HPF POC NONE SEEN NONE SEEN Final   WBC, Wet Prep HPF POC NONE SEEN NONE SEEN Final   Sperm NONE SEEN  Final  Chlamydia/NGC rt PCR ([facility] only)     Status: None   Collection Time: 09/28/15  3:30 PM  Result Value Ref Range Status   Specimen source GC/Chlam ENDOCERVICAL  Final   Chlamydia Tr NOT DETECTED NOT DETECTED Final   N  gonorrhoeae NOT DETECTED NOT DETECTED Final    Comment: (NOTE) 100  This methodology has not been evaluated in pregnant women or in 200  patients with a history of hysterectomy. 300 400  This methodology will not be performed on patients less than 101  years of age.     RADIOLOGY:  Dg Chest 2 View  09/28/2015  CLINICAL DATA:  Productive cough. EXAM: CHEST  2 VIEW COMPARISON:  Radiographs of September 21, 2014. FINDINGS: The heart size and mediastinal contours are within normal limits. Both lungs are clear. No pneumothorax or pleural effusion is noted. The visualized skeletal structures are unremarkable. IMPRESSION: No active cardiopulmonary disease. Electronically Signed   By: Marijo Conception, M.D.   On: 09/28/2015 13:09   Ct Abdomen Pelvis W Contrast  09/28/2015  CLINICAL DATA:  RIGHT upper quadrant and RIGHT lower quadrant pain, cough, congestion,  headaches, fever, body aches and fatigue for 2 days EXAM: CT ABDOMEN AND PELVIS WITH CONTRAST TECHNIQUE: Multidetector CT imaging of the abdomen and pelvis was performed using the standard protocol following bolus administration of intravenous contrast. Sagittal and coronal MPR images reconstructed from axial data set. CONTRAST:  184mL ISOVUE-300 IOPAMIDOL (ISOVUE-300) INJECTION 61% IV. Dilute oral contrast. COMPARISON:  None; correlation ultrasound pelvis 08/21/2013 FINDINGS: Lower chest:  Minimal bibasilar atelectasis Hepatobiliary: Mass with nodular peripheral enhancement posterior RIGHT lobe liver 2.9 x 2.8 x 2.4 cm demonstrating significant fill-in on delayed images consistent with hepatic hemangioma. Liver and gallbladder otherwise normal appearance. Pancreas: Normal appearance Spleen: Normal appearance Adrenals/Urinary Tract: Adrenal glands and kidneys normal appearance. Bladder and ureters normal appearance Stomach/Bowel: Normal appendix. Stomach and bowel loops normal appearance Vascular/Lymphatic: Vascular structures patent.  No adenopathy.  Reproductive: Marked enlargement of the uterus. Heterogeneous mass at upper to mid uterine segment measuring 14.4 x 10.8 x 14.4 cm either representing marked interval enlargement of a uterine mass since 2015 versus hemorrhage into a pre-existing uterine lesion. Additional smaller leiomyoma 3.1 cm diameter LEFT lateral uterus. Adnexa unremarkable. Other: Small amount of free pelvic fluid, low-attenuation. Small umbilical hernia containing fat. No free intraperitoneal air. Musculoskeletal: Osseous structures unremarkable. IMPRESSION: Marked enlargement of the uterus by a large mass at the upper to mid uterine segments, 14.4 x 14.4 x 10.8 cm in size with swirled heterogeneous internal attenuation either representing significant enlargement of a uterine mass such as leiomyoma or uterine sarcoma since the prior ultrasound from 2015 versus hemorrhage into a pre-existing uterine lesion. Additional small LEFT lateral uterine leiomyoma. Small probable hemangioma within RIGHT lobe liver 2.9 cm greatest size. Small amount of nonspecific low-attenuation free pelvic fluid. Small umbilical hernia containing fat. Findings called to Dr. Clearnce Hasten On 09/28/2015 at 1733 hours. Electronically Signed   By: Lavonia Dana M.D.   On: 09/28/2015 17:35     Management plans discussed with the patient, family and they are in agreement.  CODE STATUS:     Code Status Orders        Start     Ordered   09/28/15 1934  Full code   Continuous     09/28/15 1934    Code Status History    Date Active Date Inactive Code Status Order ID Comments User Context   This patient has a current code status but no historical code status.      TOTAL TIME TAKING CARE OF THIS PATIENT: 28 minutes.    Hower,  Karenann Cai.D on 09/29/2015 at 8:03 AM  Between 7am to 6pm - Pager - [phone]  After 6pm go to [email or web address] - [employer] [facility]  Office  [phone]  CC: Primary care physician; No PCP  Per Patient

## 2015-09-29 NOTE — Progress Notes (Signed)
   [place], [place] [address]  September 29, 2015  Patient:  Kaitlin Brown Date of Birth: 10-24-1965 Date of Visit:  09/28/2015  To Whom it May Concern:  Please excuse [place] from work from 09/28/2015 until 09/29/2015 as she was admitted to the [facility] for medical treatment and has been receiving appropriate care. She may return to work on 09/30/15, sooner if she feels she is able to return sooner than this date.      Please don't hesitate to contact me with questions or concerns by calling  [phone] and asking them to page me directly.   Regino Schultze, MD

## 2015-09-30 LAB — TYPE AND SCREEN
ABO/RH(D): A NEG
Antibody Screen: NEGATIVE
Unit division: 0
Unit division: 0
Unit division: 0

## 2015-09-30 LAB — CULTURE, GROUP A STREP (THRC)

## 2016-03-17 ENCOUNTER — Inpatient Hospital Stay: Payer: Self-pay

## 2016-03-17 ENCOUNTER — Emergency Department: Payer: Self-pay

## 2016-03-17 ENCOUNTER — Inpatient Hospital Stay
Admission: EM | Admit: 2016-03-17 | Discharge: 2016-03-18 | DRG: 760 | Disposition: A | Discharge status: Home / Self Care | Payer: Self-pay | Attending: Internal Medicine | Admitting: Internal Medicine

## 2016-03-17 DIAGNOSIS — D259 Leiomyoma of uterus, unspecified: Principal | ICD-10-CM | POA: Diagnosis present

## 2016-03-17 DIAGNOSIS — N938 Other specified abnormal uterine and vaginal bleeding: Secondary | ICD-10-CM | POA: Diagnosis present

## 2016-03-17 DIAGNOSIS — I10 Essential (primary) hypertension: Secondary | ICD-10-CM | POA: Diagnosis present

## 2016-03-17 DIAGNOSIS — E669 Obesity, unspecified: Secondary | ICD-10-CM | POA: Diagnosis present

## 2016-03-17 DIAGNOSIS — Z79890 Hormone replacement therapy (postmenopausal): Secondary | ICD-10-CM

## 2016-03-17 DIAGNOSIS — Z91128 Patient's intentional underdosing of medication regimen for other reason: Secondary | ICD-10-CM

## 2016-03-17 DIAGNOSIS — I82409 Acute embolism and thrombosis of unspecified deep veins of unspecified lower extremity: Secondary | ICD-10-CM | POA: Diagnosis present

## 2016-03-17 DIAGNOSIS — Z7951 Long term (current) use of inhaled steroids: Secondary | ICD-10-CM

## 2016-03-17 DIAGNOSIS — N946 Dysmenorrhea, unspecified: Secondary | ICD-10-CM | POA: Diagnosis present

## 2016-03-17 DIAGNOSIS — T454X6A Underdosing of iron and its compounds, initial encounter: Secondary | ICD-10-CM | POA: Diagnosis present

## 2016-03-17 DIAGNOSIS — Z6829 Body mass index (BMI) 29.0-29.9, adult: Secondary | ICD-10-CM

## 2016-03-17 DIAGNOSIS — D500 Iron deficiency anemia secondary to blood loss (chronic): Secondary | ICD-10-CM | POA: Diagnosis present

## 2016-03-17 DIAGNOSIS — Z832 Family history of diseases of the blood and blood-forming organs and certain disorders involving the immune mechanism: Secondary | ICD-10-CM

## 2016-03-17 DIAGNOSIS — D62 Acute posthemorrhagic anemia: Secondary | ICD-10-CM | POA: Diagnosis present

## 2016-03-17 DIAGNOSIS — D649 Anemia, unspecified: Secondary | ICD-10-CM | POA: Diagnosis present

## 2016-03-17 DIAGNOSIS — I2699 Other pulmonary embolism without acute cor pulmonale: Secondary | ICD-10-CM | POA: Diagnosis present

## 2016-03-17 DIAGNOSIS — I82431 Acute embolism and thrombosis of right popliteal vein: Secondary | ICD-10-CM | POA: Diagnosis present

## 2016-03-17 HISTORY — DX: Iron deficiency anemia secondary to blood loss (chronic): D50.0

## 2016-03-17 HISTORY — DX: Leiomyoma of uterus, unspecified: D25.9

## 2016-03-17 LAB — CBC WITH DIFFERENTIAL/PLATELET
Basophils Absolute: 0 10*3/uL (ref 0–0.1)
Basophils Relative: 0 %
Eosinophils Absolute: 0.1 10*3/uL (ref 0–0.7)
Eosinophils Relative: 1 %
HCT: 23.8 % — ABNORMAL LOW (ref 35.0–47.0)
Hemoglobin: 7.1 g/dL — ABNORMAL LOW (ref 12.0–16.0)
Lymphocytes Relative: 25 %
Lymphs Abs: 2 10*3/uL (ref 1.0–3.6)
MCH: 19.6 pg — ABNORMAL LOW (ref 26.0–34.0)
MCHC: 30 g/dL — ABNORMAL LOW (ref 32.0–36.0)
MCV: 65.5 fL — ABNORMAL LOW (ref 80.0–100.0)
Monocytes Absolute: 0.6 10*3/uL (ref 0.2–0.9)
Monocytes Relative: 7 %
Neutro Abs: 5.3 10*3/uL (ref 1.4–6.5)
Neutrophils Relative %: 67 %
Platelets: 492 10*3/uL — ABNORMAL HIGH (ref 150–440)
RBC: 3.64 MIL/uL — ABNORMAL LOW (ref 3.80–5.20)
RDW: 21.5 % — ABNORMAL HIGH (ref 11.5–14.5)
WBC: 8 10*3/uL (ref 3.6–11.0)

## 2016-03-17 LAB — COMPREHENSIVE METABOLIC PANEL
ALT: 11 U/L — ABNORMAL LOW (ref 14–54)
AST: 17 U/L (ref 15–41)
Albumin: 4.1 g/dL (ref 3.5–5.0)
Alkaline Phosphatase: 51 U/L (ref 38–126)
Anion gap: 5 (ref 5–15)
BUN: 7 mg/dL (ref 6–20)
CO2: 26 mmol/L (ref 22–32)
Calcium: 8.6 mg/dL — ABNORMAL LOW (ref 8.9–10.3)
Chloride: 105 mmol/L (ref 101–111)
Creatinine, Ser: 0.92 mg/dL (ref 0.44–1.00)
GFR calc Af Amer: >60 mL/min (ref >60)
GFR calc non Af Amer: >60 mL/min (ref >60)
Glucose, Bld: 109 mg/dL — ABNORMAL HIGH (ref 65–99)
Potassium: 3.5 mmol/L (ref 3.5–5.1)
Sodium: 136 mmol/L (ref 135–145)
Total Bilirubin: 0.6 mg/dL (ref 0.3–1.2)
Total Protein: 7.7 g/dL (ref 6.5–8.1)

## 2016-03-17 LAB — PREPARE RBC (CROSSMATCH)

## 2016-03-17 LAB — TROPONIN I
Troponin I: <0.03 ng/mL (ref <0.03)
Troponin I: <0.03 ng/mL (ref <0.03)
Troponin I: <0.03 ng/mL (ref <0.03)
Troponin I: <0.03 ng/mL (ref <0.03)

## 2016-03-17 LAB — HEMOGLOBIN AND HEMATOCRIT, BLOOD
HEMATOCRIT: 27.9 % — AB (ref 35.0–47.0)
Hemoglobin: 8.6 g/dL — ABNORMAL LOW (ref 12.0–16.0)

## 2016-03-17 LAB — ANTITHROMBIN III: AntiThromb III Func: 95 % (ref 75–120)

## 2016-03-17 LAB — TSH: TSH: 2.31 u[IU]/mL (ref 0.350–4.500)

## 2016-03-17 MED ORDER — KETOROLAC TROMETHAMINE 30 MG/ML IJ SOLN
INTRAMUSCULAR | Status: AC
  Administered 2016-03-17: 30 mg via INTRAVENOUS
  Filled 2016-03-17: qty 1

## 2016-03-17 MED ORDER — METOPROLOL SUCCINATE ER 25 MG PO TB24
25.0000 mg | ORAL_TABLET | Freq: Every day | ORAL | Status: DC
  Administered 2016-03-18: 25 mg via ORAL
  Filled 2016-03-17 (×2): qty 1

## 2016-03-17 MED ORDER — ONDANSETRON HCL 4 MG PO TABS: 4.0000 mg | ORAL_TABLET | Freq: Four times a day (QID) | ORAL | Status: DC | PRN

## 2016-03-17 MED ORDER — IOPAMIDOL (ISOVUE-370) INJECTION 76%
75.0000 mL | Freq: Once | INTRAVENOUS | Status: AC | PRN
  Administered 2016-03-17: 75 mL via INTRAVENOUS

## 2016-03-17 MED ORDER — ACETAMINOPHEN 325 MG PO TABS
650.0000 mg | ORAL_TABLET | Freq: Four times a day (QID) | ORAL | Status: DC | PRN
  Administered 2016-03-17: 650 mg via ORAL
  Filled 2016-03-17: qty 2

## 2016-03-17 MED ORDER — KETOROLAC TROMETHAMINE 30 MG/ML IJ SOLN
30.0000 mg | Freq: Once | INTRAMUSCULAR | Status: AC
  Administered 2016-03-17: 30 mg via INTRAVENOUS

## 2016-03-17 MED ORDER — FLUTICASONE PROPIONATE 50 MCG/ACT NA SUSP
1.0000 | Freq: Every day | NASAL | Status: DC
  Administered 2016-03-18: 1 via NASAL
  Filled 2016-03-17: qty 16

## 2016-03-17 MED ORDER — SODIUM CHLORIDE 0.9% FLUSH
3.0000 mL | Freq: Two times a day (BID) | INTRAVENOUS | Status: DC
  Administered 2016-03-17 (×2): 3 mL via INTRAVENOUS

## 2016-03-17 MED ORDER — SODIUM CHLORIDE 0.9 % IV BOLUS (SEPSIS)
1000.0000 mL | Freq: Once | INTRAVENOUS | Status: AC
  Administered 2016-03-17: 1000 mL via INTRAVENOUS

## 2016-03-17 MED ORDER — SODIUM CHLORIDE 0.9 % IV SOLN
Freq: Once | INTRAVENOUS | Status: AC
Start: 2016-03-17 — End: 2016-03-17
  Administered 2016-03-17: 08:00:00 via INTRAVENOUS

## 2016-03-17 MED ORDER — SODIUM CHLORIDE 0.9 % IV SOLN: 10.0000 mL/h | Freq: Once | INTRAVENOUS | Status: DC

## 2016-03-17 MED ORDER — OXYCODONE-ACETAMINOPHEN 5-325 MG PO TABS
1.0000 | ORAL_TABLET | Freq: Four times a day (QID) | ORAL | Status: DC | PRN
  Administered 2016-03-17 – 2016-03-18 (×5): 1 via ORAL
  Filled 2016-03-17 (×5): qty 1

## 2016-03-17 MED ORDER — DOCUSATE SODIUM 100 MG PO CAPS
100.0000 mg | ORAL_CAPSULE | Freq: Two times a day (BID) | ORAL | Status: DC
  Administered 2016-03-17 – 2016-03-18 (×3): 100 mg via ORAL
  Filled 2016-03-17 (×3): qty 1

## 2016-03-17 MED ORDER — FERROUS GLUCONATE 324 (38 FE) MG PO TABS
324.0000 mg | ORAL_TABLET | Freq: Every day | ORAL | Status: DC
  Administered 2016-03-17 – 2016-03-18 (×2): 324 mg via ORAL
  Filled 2016-03-17 (×2): qty 1

## 2016-03-17 MED ORDER — ENOXAPARIN SODIUM 100 MG/ML ~~LOC~~ SOLN: 1.0000 mg/kg | Freq: Once | SUBCUTANEOUS | Status: DC

## 2016-03-17 MED ORDER — HYDROCHLOROTHIAZIDE 12.5 MG PO CAPS
12.5000 mg | ORAL_CAPSULE | Freq: Every day | ORAL | Status: DC
  Administered 2016-03-17 – 2016-03-18 (×2): 12.5 mg via ORAL
  Filled 2016-03-17 (×2): qty 1

## 2016-03-17 MED ORDER — LISINOPRIL 10 MG PO TABS
10.0000 mg | ORAL_TABLET | Freq: Every day | ORAL | Status: DC
  Administered 2016-03-17 – 2016-03-18 (×2): 10 mg via ORAL
  Filled 2016-03-17 (×2): qty 1

## 2016-03-17 MED ORDER — ACETAMINOPHEN 650 MG RE SUPP: 650.0000 mg | Freq: Four times a day (QID) | RECTAL | Status: DC | PRN

## 2016-03-17 MED ORDER — ONDANSETRON HCL 4 MG/2ML IJ SOLN: 4.0000 mg | Freq: Four times a day (QID) | INTRAMUSCULAR | Status: DC | PRN

## 2016-03-17 MED ORDER — LISINOPRIL-HYDROCHLOROTHIAZIDE 10-12.5 MG PO TABS: 1.0000 | ORAL_TABLET | Freq: Every day | ORAL | Status: DC

## 2016-03-17 MED ORDER — ENOXAPARIN SODIUM 100 MG/ML ~~LOC~~ SOLN
1.0000 mg/kg | Freq: Two times a day (BID) | SUBCUTANEOUS | Status: DC
  Administered 2016-03-17 – 2016-03-18 (×2): 85 mg via SUBCUTANEOUS
  Filled 2016-03-17 (×2): qty 1

## 2016-03-17 MED ORDER — MEDROXYPROGESTERONE ACETATE 10 MG PO TABS
20.0000 mg | ORAL_TABLET | Freq: Three times a day (TID) | ORAL | Status: DC
  Administered 2016-03-17 – 2016-03-18 (×4): 20 mg via ORAL
  Filled 2016-03-17 (×5): qty 2

## 2016-03-17 NOTE — ED Notes (Signed)
Patient reports having blood transfusion in July at [facility]

## 2016-03-17 NOTE — ED Notes (Signed)
Pt reports hx of fibroids. Pt c/o heavy vaginal bleeding X 2 weeks. Pt reports she just began taking hormones (norethindrone 5 mg). Pt c/o right calf pain with stretch/palpation.   Pt reports having been prescribed hormones before and stopping them due to heavy bleeding.

## 2016-03-17 NOTE — Progress Notes (Signed)
Tele box applied and verified with CCMD, with Sharyn Lull, Therapist, sports. Pt. Oriented to room and call bell system. Report given to [facility], day shift nurse.

## 2016-03-17 NOTE — ED Provider Notes (Signed)
[facility] Emergency Department Provider Note  Time seen: 12:05 AM  I have reviewed the triage vital signs and the nursing notes.   HISTORY  Chief Complaint Vaginal Bleeding    HPI Kaitlin Brown is a 51 y.o. female presents emergency Department with heavy vaginal bleeding 2 weeks with acute worsening the last day. Patient states that she also expands rapid heart rate and dizziness occasionally especially with positional change. Patient states that she was seen by OB/GYN and placed on birth control pills and informed that she had a uterine fibroid. Last patient stated that she's had right lower extremity posterior calf pain past few days as well. Patient denies any dyspnea or does admit to right posterior shoulder pain   Past Medical History:  Diagnosis Date  . Back pain   . Bladder infection   . Chicken pox   . Hypertension     Patient Active Problem List   Diagnosis Date Noted  . Symptomatic anemia 09/28/2015  . Uterine mass 09/28/2015    No past surgical history on file.  Prior to Admission medications   Medication Sig Start Date End Date Taking? Authorizing Provider  ferrous gluconate (FERGON) 324 MG tablet Take 1 tablet (324 mg total) by mouth daily with breakfast. 09/29/15   Lytle Butte, MD  fluticasone [facility]) 50 MCG/ACT nasal spray Place 1 spray into both nostrils daily. 09/29/15   Lytle Butte, MD  lisinopril-hydrochlorothiazide (PRINZIDE,ZESTORETIC) 10-12.5 MG per tablet Take 1 tablet by mouth daily.    Historical Provider, MD  metoprolol succinate (TOPROL-XL) 25 MG 24 hr tablet Take 1 tablet by mouth daily. 04/10/14   Historical Provider, MD    Allergies Patient has no known allergies.  Family History  Problem Relation Age of Onset  . Hypertension Mother   . Diabetes Mother   . Glaucoma Mother   . Heart disease Father     Congestive heart condition    Social History Social History  Substance Use Topics  .  Smoking status: Never Smoker  . Smokeless tobacco: Not on file  . Alcohol use Yes     Comment: occasionally    Review of Systems Constitutional: No fever/chills Eyes: No visual changes. ENT: No sore throat. Cardiovascular: Denies chest pain. Respiratory: Denies shortness of breath. Gastrointestinal: No abdominal pain.  No nausea, no vomiting.  No diarrhea.  No constipation. Genitourinary: Negative for dysuria.Positive for heavy vaginal bleeding Musculoskeletal: Negative for back pain.Positive for right leg pain Skin: Negative for rash. Neurological: Negative for headaches, focal weakness or numbness. } 10-point ROS otherwise negative.  ____________________________________________   PHYSICAL EXAM:  VITAL SIGNS: ED Triage Vitals [03/17/16 0034]  Enc Vitals Group     BP (!) 153/85     Pulse Rate (!) 112     Resp 20     Temp (!) 96 F (35.6 C)     Temp Source Oral     SpO2 97 %     Weight 185 lb (83.9 kg)     Height 5\' 6"  (1.676 m)     Head Circumference      Peak Flow      Pain Score      Pain Loc      Pain Edu?      Excl. in [place]?     Constitutional: Alert and oriented. Well appearing and in no acute distress. Eyes: Conjunctivae are Pale. PERRL. EOMI. Head: Atraumatic. Mouth/Throat: Mucous membranes are moist.  Oropharynx non-erythematous. Neck: No  stridor. Cardiovascular: Tachycardia regular rhythm. Good peripheral circulation. Grossly normal heart sounds. Respiratory: Normal respiratory effort.  No retractions. Lungs CTAB. Gastrointestinal: Soft and nontender. No distention.  Musculoskeletal: Right posterior calf pain with palpation.. No gross deformities of extremities. Neurologic:  Normal speech and language. No gross focal neurologic deficits are appreciated.  Skin:  Skin is warm, dry and intact. No rash noted. Psychiatric: Mood and affect are normal. Speech and behavior are normal.  ____________________________________________   LABS (all labs ordered  are listed, but only abnormal results are displayed)  Labs Reviewed  CBC WITH DIFFERENTIAL/PLATELET - Abnormal; Notable for the following:       Result Value   RBC 3.64 (*)    Hemoglobin 7.1 (*)    HCT 23.8 (*)    MCV 65.5 (*)    MCH 19.6 (*)    MCHC 30.0 (*)    RDW 21.5 (*)    Platelets 492 (*)    All other components within normal limits  COMPREHENSIVE METABOLIC PANEL  ANTITHROMBIN III  PROTEIN C ACTIVITY  PROTEIN C, TOTAL  PROTEIN S ACTIVITY  PROTEIN S, TOTAL  LUPUS ANTICOAGULANT PANEL  BETA-2-GLYCOPROTEIN I ABS, IGG/M/A  HOMOCYSTEINE  FACTOR 5 LEIDEN  PROTHROMBIN GENE MUTATION  CARDIOLIPIN ANTIBODIES, IGG, IGM, IGA  TROPONIN I  TYPE AND SCREEN  PREPARE RBC (CROSSMATCH)    RADIOLOGY I, [place] Makinsey Pepitone, personally viewed and evaluated these images (plain radiographs) as part of my medical decision making, as well as reviewing the written report by the radiologist. CLINICAL DATA:  Right calf pain for 1 week  EXAM: Right LOWER EXTREMITY VENOUS DOPPLER ULTRASOUND  TECHNIQUE: Gray-scale sonography with graded compression, as well as color Doppler and duplex ultrasound were performed to evaluate the lower extremity deep venous systems from the level of the common femoral vein and including the common femoral, femoral, profunda femoral, popliteal and calf veins including the posterior tibial, peroneal and gastrocnemius veins when visible. The superficial great saphenous vein was also interrogated. Spectral Doppler was utilized to evaluate flow at rest and with distal augmentation maneuvers in the common femoral, femoral and popliteal veins.  COMPARISON:  None.  FINDINGS: Contralateral Common Femoral Vein: Respiratory phasicity is normal and symmetric with the symptomatic side. No evidence of thrombus. Normal compressibility.  Common Femoral Vein: No evidence of thrombus. Normal compressibility, respiratory phasicity and response to  augmentation.  Saphenofemoral Junction: No evidence of thrombus. Normal compressibility and flow on color Doppler imaging.  Profunda Femoral Vein: No evidence of thrombus. Normal compressibility and flow on color Doppler imaging.  Femoral Vein: No evidence of thrombus. Normal compressibility, respiratory phasicity and response to augmentation.  Popliteal Vein: There is intraluminal thrombus, although not completely occlusive. Some flow gets around the intraluminal thrombus.  Calf Veins: Intraluminal thrombus, partially to completely occlusive.  Superficial Great Saphenous Vein: No evidence of thrombus. Normal compressibility and flow on color Doppler imaging.  Venous Reflux:  None.  Other Findings:  None.  IMPRESSION: Deep venous thrombosis of the calf veins and popliteal vein. Indeterminate chronicity but it more likely is recent. These results will be called to the ordering clinician or representative by the Radiologist Assistant, and communication documented in the PACS or zVision Dashboard.   Electronically Signed   By: Andreas Newport M.D.   On: 03/17/2016 04:45  Procedures    INITIAL IMPRESSION / ASSESSMENT AND PLAN / ED COURSE  Pertinent labs & imaging results that were available during my care of the patient were reviewed by me  and considered in my medical decision making (see chart for details).  History physical exam concerning for possible DVT/PE as such ultrasound right lower extremity performed which revealed a DVT. Patient has no primary care provider and currently has heavy vaginal bleeding causing her to be anemic. CT scan pulmonary emboli study pending we will give patient Lovenox as well with the patient at length regarding this intervention and the possibility that she will cry blood transfusion. Patient discussed with Dr. Marcille Blanco for hospital admission for further evaluation and management.   Clinical Course      ____________________________________________  FINAL CLINICAL IMPRESSION(S) / ED DIAGNOSES  Final diagnoses:  Dysfunctional uterine bleeding  Uterine leiomyoma, unspecified location  Acute deep vein thrombosis (DVT) of popliteal vein of right lower extremity (HCC)  Symptomatic anemia     MEDICATIONS GIVEN DURING THIS VISIT:  Medications  enoxaparin (LOVENOX) injection 85 mg (not administered)  sodium chloride 0.9 % bolus 1,000 mL (0 mLs Intravenous Stopped 03/17/16 0332)  ketorolac (TORADOL) 30 MG/ML injection 30 mg (30 mg Intravenous Given 03/17/16 0333)     NEW OUTPATIENT MEDICATIONS STARTED DURING THIS VISIT:  New Prescriptions   No medications on file    Modified Medications   No medications on file    Discontinued Medications   No medications on file     Note:  This document was prepared using Dragon voice recognition software and may include unintentional dictation errors.    Gregor Hams, MD 03/17/16 [phone]

## 2016-03-17 NOTE — Progress Notes (Signed)
CH responded to an OR for an AD. Pt was awake and somewhat alert. Family was bedside. Pt was educated on the AD and requested time to look it over before completion. CH is available for follow up as needed.    03/17/16 1300  Clinical Encounter Type  Visited With Patient;Patient and family together  Visit Type Initial;Spiritual support  Referral From Nurse  Spiritual Encounters  Spiritual Needs Literature

## 2016-03-17 NOTE — Progress Notes (Signed)
[facility] [place] NAME: Kaitlin Brown    MR#:  [id number]  DATE OF BIRTH:  05-Sep-1965  SUBJECTIVE:  CHIEF COMPLAINT:   Chief Complaint  Patient presents with  . Vaginal Bleeding   - Patient with uterine fibroid-with dysmenorrhea admitted with anemia. -Complains of chest pain and right leg pain. Also noted to have DVT and PE.  REVIEW OF SYSTEMS:  Review of Systems  Constitutional: Positive for malaise/fatigue. Negative for chills, fever and weight loss.  HENT: Negative for ear discharge, ear pain, hearing loss and nosebleeds.   Eyes: Negative for blurred vision.  Respiratory: Positive for shortness of breath. Negative for cough, hemoptysis and wheezing.   Cardiovascular: Positive for chest pain. Negative for palpitations, orthopnea and leg swelling.  Gastrointestinal: Negative for abdominal pain, constipation, diarrhea, melena, nausea and vomiting.  Genitourinary: Negative for dysuria, frequency, hematuria and urgency.  Musculoskeletal: Positive for myalgias. Negative for back pain and neck pain.  Skin: Negative for rash.  Neurological: Negative for dizziness, sensory change, speech change, focal weakness and headaches.  Endo/Heme/Allergies: Does not bruise/bleed easily.  Psychiatric/Behavioral: Negative for depression.    DRUG ALLERGIES:  No Known Allergies  VITALS:  Blood pressure (!) 110/57, pulse 90, temperature 98.6 F (37 C), temperature source Oral, resp. rate 18, height 5\' 6"  (1.676 m), weight 83.9 kg (185 lb), last menstrual period 03/04/2016, SpO2 97 %.  PHYSICAL EXAMINATION:  Physical Exam  GENERAL:  51 y.o.-year-old obese patient lying in the bed with no acute distress.  EYES: Pupils equal, round, reactive to light and accommodation. No scleral icterus. Extraocular muscles intact. Pale conjunctiva noted. HEENT: Head atraumatic, normocephalic. Oropharynx and nasopharynx clear.  NECK:  Supple, no jugular venous  distention. No thyroid enlargement, no tenderness.  LUNGS: Normal breath sounds bilaterally, no wheezing, rales,rhonchi or crepitation. No use of accessory muscles of respiration.  CARDIOVASCULAR: S1, S2 normal. No murmurs, rubs, or gallops.  ABDOMEN: Soft, nontender, nondistended. Bowel sounds present. No organomegaly or mass.  EXTREMITIES: No pedal edema, cyanosis, or clubbing.  NEUROLOGIC: Cranial nerves II through XII are intact. Muscle strength 5/5 in all extremities. Sensation intact. Gait not checked.  PSYCHIATRIC: The patient is alert and oriented x 3.  SKIN: No obvious rash, lesion, or ulcer.    LABORATORY PANEL:   CBC  Recent Labs Lab 03/17/16 0039  WBC 8.0  HGB 7.1*  HCT 23.8*  PLT 492*   ------------------------------------------------------------------------------------------------------------------  Chemistries   Recent Labs Lab 03/17/16 0039  NA 136  K 3.5  CL 105  CO2 26  GLUCOSE 109*  BUN 7  CREATININE 0.92  CALCIUM 8.6*  AST 17  ALT 11*  ALKPHOS 51  BILITOT 0.6   ------------------------------------------------------------------------------------------------------------------  Cardiac Enzymes  Recent Labs Lab 03/17/16 1355  TROPONINI <0.03   ------------------------------------------------------------------------------------------------------------------  RADIOLOGY:  Ct Angio Chest Pe W And/or Wo Contrast  Result Date: 03/17/2016 CLINICAL DATA:  Chest pain and dyspnea.  Tachycardia. EXAM: CT ANGIOGRAPHY CHEST WITH CONTRAST TECHNIQUE: Multidetector CT imaging of the chest was performed using the standard protocol during bolus administration of intravenous contrast. Multiplanar CT image reconstructions and MIPs were obtained to evaluate the vascular anatomy. CONTRAST:  75 mL Isovue 370 intravenous COMPARISON:  12/17/2004 FINDINGS: Cardiovascular: There are acute pulmonary emboli to the upper lobes and lower lobes bilaterally, small to moderate  volume. No central emboli. No CT evidence of right heart strain. Mediastinum/Nodes: No enlarged mediastinal, hilar, or axillary lymph nodes. Thyroid gland, trachea, and esophagus  demonstrate no significant findings. Lungs/Pleura: Multifocal mild patchy airspace opacities, peripheral and possibly representing small areas of infarction. No dense consolidation. No effusions. Airways are patent and normal in caliber. 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 No suspicious focal liver lesion. Musculoskeletal: No significant skeletal lesion. Review of the MIP images confirms the above findings. IMPRESSION: Multiple pulmonary emboli to the upper lobes and lower lobes bilaterally. No CT evidence of right heart strain. These results were called by telephone at the time of interpretation on 03/17/2016 at 6:40 am to Dr. Marjean Donna , who verbally acknowledged these results. Electronically Signed   By: Andreas Newport M.D.   On: 03/17/2016 06:43   US Venous Img Lower Unilateral Right  Result Date: 03/17/2016 CLINICAL DATA:  Right calf pain for 1 week EXAM: Right LOWER EXTREMITY VENOUS DOPPLER ULTRASOUND TECHNIQUE: Gray-scale sonography with graded compression, as well as color Doppler and duplex ultrasound were performed to evaluate the lower extremity deep venous systems from the level of the common femoral vein and including the common femoral, femoral, profunda femoral, popliteal and calf veins including the posterior tibial, peroneal and gastrocnemius veins when visible. The superficial great saphenous vein was also interrogated. Spectral Doppler was utilized to evaluate flow at rest and with distal augmentation maneuvers in the common femoral, femoral and popliteal veins. COMPARISON:  None. FINDINGS: Contralateral Common Femoral Vein: Respiratory phasicity is normal and symmetric with the symptomatic side. No evidence of thrombus. Normal compressibility. Common Femoral Vein: No  evidence of thrombus. Normal compressibility, respiratory phasicity and response to augmentation. Saphenofemoral Junction: No evidence of thrombus. Normal compressibility and flow on color Doppler imaging. Profunda Femoral Vein: No evidence of thrombus. Normal compressibility and flow on color Doppler imaging. Femoral Vein: No evidence of thrombus. Normal compressibility, respiratory phasicity and response to augmentation. Popliteal Vein: There is intraluminal thrombus, although not completely occlusive. Some flow gets around the intraluminal thrombus. Calf Veins: Intraluminal thrombus, partially to completely occlusive. Superficial Great Saphenous Vein: No evidence of thrombus. Normal compressibility and flow on color Doppler imaging. Venous Reflux:  None. Other Findings:  None. IMPRESSION: Deep venous thrombosis of the calf veins and popliteal vein. Indeterminate chronicity but it more likely is recent. These results will be called to the ordering clinician or representative by the Radiologist Assistant, and communication documented in the PACS or zVision Dashboard. Electronically Signed   By: Andreas Newport M.D.   On: 03/17/2016 04:45    EKG:   Orders placed or performed in visit on 09/23/10  . EKG 12-Lead    ASSESSMENT AND PLAN:   51 year old female with past medical history significant for uterine fibroid, chronic anemia, hypertension and low back pain presents to the hospital secondary to chest discomfort and weakness.  #1 acute on chronic anemia-has no iron deficiency anemia from dysmenorrhea. -Was not taking iron supplements. Hemoglobin dropped from 9 to 7. Also having dysmenorrhea. -2 units packed RBC ordered in the emergency room. Continue iron supplements as outpatient.  #2 right leg DVT and PE-was taking hormonal supplements for her dysmenorrhea, also family history of blood clots. -Hypercoagulable workup sent for. Currently on Lovenox. -If hemoglobin is stable, can be discharged on  oral anticoagulation. Will continue taking progestin supplements according to GYN along with anticoagulation.  #3 uterine fibroid and dysmenorrhea-patient has adamantly refused hysterectomy in the past and currently. Plan is to start on high-dose progestin supplements to shrink the fibroid and follow-up at [facility]. -Hysterectomy again advised.  #4 hypertension-continue Toprol,  lisinopril and hydrochlorothiazide  #5 DVT prophylaxis-on Lovenox     All the records are reviewed and case discussed with Care Management/Social Workerr. Management plans discussed with the patient, family and they are in agreement.  CODE STATUS: Full Code  TOTAL TIME TAKING CARE OF THIS PATIENT: 38 minutes.   POSSIBLE D/C IN 2 DAYS, DEPENDING ON CLINICAL CONDITION.   Gladstone Lighter M.D on 03/17/2016 at 2:36 PM  Between 7am to 6pm - Pager - [phone]  After 6pm go to [email or web address] - password [email or web address] [facility]  Office  [phone]  CC: Primary care physician; No PCP Per Patient

## 2016-03-17 NOTE — ED Notes (Signed)
Pt refused to sign consent for blood after talking to Dr. Owens Shark explained risks and benefits.

## 2016-03-17 NOTE — ED Notes (Signed)
Patient transported to Ultrasound 

## 2016-03-17 NOTE — ED Notes (Signed)
Nurse checked pt's IV site. Pt stating that site was hurting. IV site has no signs of infiltration and no redness. Pt stating that the pain was not as bad as it was when she first called. Pt placed a blanket under pt's arm to support her arm. Pt is tolerating good at this time.

## 2016-03-17 NOTE — H&P (Signed)
Kaitlin Brown is an 51 y.o. female.   Chief Complaint: Vaginal bleeding HPI: The patient presents emergency department complaining of painful and heavy vaginal bleeding. It occurs only during her menstrual cycle. She has known fibroid tumors as well as anemia. She received a blood transfusion 5 months ago but admits to not continuing her iron supplements as directed. In the emergency department she also complained of chest pain as well as right lower extremity pain. Ultrasound of the extremity showed nonocclusive extensive DVT and CT angiogram of her chest showed multiple peripheral pulmonary emboli. Laboratory evaluation showed hemoglobin of 7.1, all of which prompted the emergency department staff to call the hospitalist service for admission.  Past Medical History:  Diagnosis Date  . Back pain   . Bladder infection   . Chicken pox   . Hypertension     Past Surgical History:  Procedure Laterality Date  . none      Family History  Problem Relation Age of Onset  . Hypertension Mother   . Diabetes Mother   . Glaucoma Mother   . Heart disease Father     Congestive heart condition  . Clotting disorder Maternal Grandmother    Social History:  reports that she has never smoked. She does not have any smokeless tobacco history on file. She reports that she drinks alcohol. Her drug history is not on file.  Allergies: No Known Allergies  Medications Prior to Admission  Medication Sig Dispense Refill  . ferrous gluconate (FERGON) 324 MG tablet Take 1 tablet (324 mg total) by mouth daily with breakfast. 30 tablet 3  . lisinopril-hydrochlorothiazide (PRINZIDE,ZESTORETIC) 10-12.5 MG per tablet Take 1 tablet by mouth daily.    . norethindrone (AYGESTIN) 5 MG tablet Take 1 tablet by mouth daily.  4    Results for orders placed or performed during the hospital encounter of 03/17/16 (from the past 48 hour(s))  CBC with Differential     Status: Abnormal   Collection Time: 03/17/16  12:39 AM  Result Value Ref Range   WBC 8.0 3.6 - 11.0 K/uL   RBC 3.64 (L) 3.80 - 5.20 MIL/uL   Hemoglobin 7.1 (L) 12.0 - 16.0 g/dL   HCT 23.8 (L) 35.0 - 47.0 %   MCV 65.5 (L) 80.0 - 100.0 fL   MCH 19.6 (L) 26.0 - 34.0 pg   MCHC 30.0 (L) 32.0 - 36.0 g/dL   RDW 21.5 (H) 11.5 - 14.5 %   Platelets 492 (H) 150 - 440 K/uL   Neutrophils Relative % 67 %   Neutro Abs 5.3 1.4 - 6.5 K/uL   Lymphocytes Relative 25 %   Lymphs Abs 2.0 1.0 - 3.6 K/uL   Monocytes Relative 7 %   Monocytes Absolute 0.6 0.2 - 0.9 K/uL   Eosinophils Relative 1 %   Eosinophils Absolute 0.1 0 - 0.7 K/uL   Basophils Relative 0 %   Basophils Absolute 0.0 0 - 0.1 K/uL  Type and screen [facility]     Status: None (Preliminary result)   Collection Time: 03/17/16 12:39 AM  Result Value Ref Range   Blood Product Unit Number [id number]    Unit Type and Rh 9500    Blood Product Expiration Date 209470962836    Blood Product Unit Number [id number]    Unit Type and Rh 9500    Blood Product Expiration Date 465681275170   Comprehensive metabolic panel     Status: Abnormal   Collection Time: 03/17/16 12:39 AM  Result  Value Ref Range   Sodium 136 135 - 145 mmol/L   Potassium 3.5 3.5 - 5.1 mmol/L   Chloride 105 101 - 111 mmol/L   CO2 26 22 - 32 mmol/L   Glucose, Bld 109 (H) 65 - 99 mg/dL   BUN 7 6 - 20 mg/dL   Creatinine, Ser 0.92 0.44 - 1.00 mg/dL   Calcium 8.6 (L) 8.9 - 10.3 mg/dL   Total Protein 7.7 6.5 - 8.1 g/dL   Albumin 4.1 3.5 - 5.0 g/dL   AST 17 15 - 41 U/L   ALT 11 (L) 14 - 54 U/L   Alkaline Phosphatase 51 38 - 126 U/L   Total Bilirubin 0.6 0.3 - 1.2 mg/dL   GFR calc non Af Amer >60 >60 mL/min   GFR calc Af Amer >60 >60 mL/min    Comment: (NOTE) The eGFR has been calculated using the CKD EPI equation. This calculation has not been validated in all clinical situations. eGFR's persistently <60 mL/min signify possible Chronic Kidney Disease.    Anion gap 5 5 - 15  Prepare RBC      Status: None   Collection Time: 03/17/16  2:30 AM  Result Value Ref Range   Order Confirmation ORDER PROCESSED BY BLOOD BANK   Troponin I     Status: None   Collection Time: 03/17/16  5:15 AM  Result Value Ref Range   Troponin I <0.03 <0.03 ng/mL   Ct Angio Chest Pe W And/or Wo Contrast  Result Date: 03/17/2016 CLINICAL DATA:  Chest pain and dyspnea.  Tachycardia. EXAM: CT ANGIOGRAPHY CHEST WITH CONTRAST TECHNIQUE: Multidetector CT imaging of the chest was performed using the standard protocol during bolus administration of intravenous contrast. Multiplanar CT image reconstructions and MIPs were obtained to evaluate the vascular anatomy. CONTRAST:  75 mL Isovue 370 intravenous COMPARISON:  12/17/2004 FINDINGS: Cardiovascular: There are acute pulmonary emboli to the upper lobes and lower lobes bilaterally, small to moderate volume. No central emboli. No CT evidence of right heart strain. Mediastinum/Nodes: No enlarged mediastinal, hilar, or axillary lymph nodes. Thyroid gland, trachea, and esophagus demonstrate no significant findings. Lungs/Pleura: Multifocal mild patchy airspace opacities, peripheral and possibly representing small areas of infarction. No dense consolidation. No effusions. Airways are patent and normal in caliber. 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 No suspicious focal liver lesion. Musculoskeletal: No significant skeletal lesion. Review of the MIP images confirms the above findings. IMPRESSION: Multiple pulmonary emboli to the upper lobes and lower lobes bilaterally. No CT evidence of right heart strain. These results were called by telephone at the time of interpretation on 03/17/2016 at 6:40 am to Dr. Marjean Donna , who verbally acknowledged these results. Electronically Signed   By: Andreas Newport M.D.   On: 03/17/2016 06:43   US Venous Img Lower Unilateral Right  Result Date: 03/17/2016 CLINICAL DATA:  Right calf pain for 1 week EXAM:  Right LOWER EXTREMITY VENOUS DOPPLER ULTRASOUND TECHNIQUE: Gray-scale sonography with graded compression, as well as color Doppler and duplex ultrasound were performed to evaluate the lower extremity deep venous systems from the level of the common femoral vein and including the common femoral, femoral, profunda femoral, popliteal and calf veins including the posterior tibial, peroneal and gastrocnemius veins when visible. The superficial great saphenous vein was also interrogated. Spectral Doppler was utilized to evaluate flow at rest and with distal augmentation maneuvers in the common femoral, femoral and popliteal veins. COMPARISON:  None. FINDINGS: Contralateral Common  Femoral Vein: Respiratory phasicity is normal and symmetric with the symptomatic side. No evidence of thrombus. Normal compressibility. Common Femoral Vein: No evidence of thrombus. Normal compressibility, respiratory phasicity and response to augmentation. Saphenofemoral Junction: No evidence of thrombus. Normal compressibility and flow on color Doppler imaging. Profunda Femoral Vein: No evidence of thrombus. Normal compressibility and flow on color Doppler imaging. Femoral Vein: No evidence of thrombus. Normal compressibility, respiratory phasicity and response to augmentation. Popliteal Vein: There is intraluminal thrombus, although not completely occlusive. Some flow gets around the intraluminal thrombus. Calf Veins: Intraluminal thrombus, partially to completely occlusive. Superficial Great Saphenous Vein: No evidence of thrombus. Normal compressibility and flow on color Doppler imaging. Venous Reflux:  None. Other Findings:  None. IMPRESSION: Deep venous thrombosis of the calf veins and popliteal vein. Indeterminate chronicity but it more likely is recent. These results will be called to the ordering clinician or representative by the Radiologist Assistant, and communication documented in the PACS or zVision Dashboard. Electronically Signed    By: Andreas Newport M.D.   On: 03/17/2016 04:45    Review of Systems  Constitutional: Negative for chills and fever.  HENT: Negative for sore throat and tinnitus.   Eyes: Negative for blurred vision and redness.  Respiratory: Negative for cough and shortness of breath.   Cardiovascular: Positive for chest pain. Negative for palpitations, orthopnea and PND.  Gastrointestinal: Negative for abdominal pain, diarrhea, nausea and vomiting.  Genitourinary: Negative for dysuria, frequency and urgency.       Metromenorrhagia   Musculoskeletal: Negative for joint pain and myalgias.       Right leg pain  Skin: Negative for rash.       No lesions  Neurological: Negative for speech change, focal weakness and weakness.  Endo/Heme/Allergies: Does not bruise/bleed easily.       No temperature intolerance  Psychiatric/Behavioral: Negative for depression and suicidal ideas.    Blood pressure 131/82, pulse (!) 104, temperature (!) 96 F (35.6 C), temperature source Oral, resp. rate 20, height 5' 6"  (1.676 m), weight 83.9 kg (185 lb), last menstrual period 03/04/2016, SpO2 99 %. Physical Exam  Vitals reviewed. Constitutional: She is oriented to person, place, and time. She appears well-developed and well-nourished. No distress.  HENT:  Head: Normocephalic and atraumatic.  Mouth/Throat: Oropharynx is clear and moist.  Eyes: Conjunctivae and EOM are normal. Pupils are equal, round, and reactive to light. No scleral icterus.  Neck: Normal range of motion. Neck supple. No JVD present. No tracheal deviation present. No thyromegaly present.  Cardiovascular: Normal rate, regular rhythm and normal heart sounds.  Exam reveals no gallop and no friction rub.   No murmur heard. Respiratory: Effort normal and breath sounds normal.  GI: Soft. Bowel sounds are normal. She exhibits mass (tender; RLQ/pelvis). She exhibits no distension. There is no tenderness.  Genitourinary:  Genitourinary Comments: Deferred   Musculoskeletal: Normal range of motion. She exhibits no edema.  Lymphadenopathy:    She has no cervical adenopathy.  Neurological: She is alert and oriented to person, place, and time. No cranial nerve deficit. She exhibits normal muscle tone.  Skin: Skin is warm and dry. No rash noted. No erythema.  Psychiatric: She has a normal mood and affect. Her behavior is normal. Judgment and thought content normal.     Assessment/Plan This is a 51 year old female admitted for DVT/PE and anemia. 1. Anemia: Symptomatic; tachycardia and malaise. The patient is typed and crossmatched for transfusion of 2 units of red blood cells. Monitor telemetry.  2. DVT/PE: The patient has been started on therapeutic Lovenox. Investigation into her family history is significant for a grandmother who died in her 75s of a clot. The patient denies miscarriages. However, due to her relatively young age as well as unrecognized risk factors for development we will proceed with a hypercoagulable workup. 3. Uterine fibroids: Large and painful. Likely to continue bleeding. OB/GYN has been consulted for possible hysterectomy. 4. Hypertension: sub-optimally controlled; continue metoprolol and lisinopril with 100 chlorothiazide. Reassess dosing once patient is stabilized. 5. DVT prophylaxis: Full dose anticoagulation 6. GI prophylaxis: None The patient is a full code. Time spent on admission orders and patient care proximally 45 minutes  Harrie Foreman, MD 03/17/2016, 6:51 AM

## 2016-03-17 NOTE — ED Notes (Signed)
MD Brown at bedside.

## 2016-03-17 NOTE — ED Notes (Signed)
Called Lisa RN on 2A to with CT results

## 2016-03-17 NOTE — ED Triage Notes (Signed)
Patient reports heavy vaginal bleeding, taking hormones.  Patient reports feeling fatigued and short of breath.

## 2016-03-17 NOTE — Consult Note (Signed)
GYNECOLOGY CONSULT NOTE  GYN Consultation  Attending Provider: Gladstone Lighter, MD   Kaitlin Brown FT:[id number] 03/17/2016 10:44 AM    Reason for Consultation:   Kaitlin Brown is a 51 y.o. G2P0020 premenopausal female seen at the request of Dr Tressia Miners for evaluation of symptomatic anemia due to vaginal bleeding.    History of Present Ilness:   Kaitlin Brown is a female with an established history of heavy vaginal bleeding due to a large fibroid uterus.  She was seen by me during her hospitalization in July of this year due to symptomatic anemia secondary to heavy vaginal bleeding.  She was ordered progesterone to take daily to keep her bleeding lighter. She states that she never took this medication.  She did, however, follow up at [facility] where she was evaluated in the clinic, by MRI, and by hysteroscopy with dilation and curettage.  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  On another specimen atypia and hyperplasia could not be ruled out.  A long discussion was had with the patient at [facility] regarding their recommendation of a hysterectomy.  She adamantly declined a hysterectomy at that time.  Alternatives were discussed with her and ultimately she was given a prescription for norethindrone.  She has not followed up with [facility] since just after her surgery. She presented to the ER on this admission due to two weeks worth of heavy vaginal bleeding and passage of large blood clots.  She also began having left calf cramping and some chest pain, especially on exertion.  She also noticed her heart racing.  Around Christmas she actually started taking the norethindrone prescribed by [facility] and she associates her heavy bleeding with taking that medication.  She states that she put off coming to the ER over the past two weeks and finally decided to come in for this admission.  She states she took oral iron supplementation for a while but has  stopped taking that medication.  Her description of how she has taken any medication is a bit wandering and inconsistent.  During her evaluation at [facility] she was noted to have a large fibroid. She underwent an MRI in September of 2017. The MRI results are as follows: Enlarged fibroid uterus measuring 19.3 x 12.0 x 21.0 cm. There is a dominant fibroid centered in the right uterine body that is heterogenous and measures 13.7 x 12.2 x 15.4 cm causing marked mass effect and deviation of the endometrial canal. There is a smaller fibroid noted in the left uterine fundus measuring 3.4 x 4.6 x 4.3 cm. Ovaries normal.  This is a roughly equivalent measurement to one taken in July 2017 by CT.    She has had some intentional weight loss over the past year.  Denies other symptoms.   Past Medical History:  Diagnosis Date  . Anemia due to blood loss, chronic    due to menorrhagia  . Back pain   . Bladder infection   . Chicken pox   . Fibroid uterus   . Hypertension    Past Surgical History:  Procedure Laterality Date  . HYSTEROSCOPY W/D&C  10/2014   [facility], Dr Guadalupe Maple  . none     Allergies: No Known Allergies   Prior to Admission medications   Medication Sig Start Date End Date Taking? Authorizing Provider  ferrous gluconate (FERGON) 324 MG tablet Take 1 tablet (324 mg total) by mouth daily with breakfast. 09/29/15  Yes Lytle Butte, MD  lisinopril-hydrochlorothiazide (PRINZIDE,ZESTORETIC) 10-12.5 MG per tablet Take 1 tablet by mouth daily.   Yes Historical Provider, MD  norethindrone (AYGESTIN) 5 MG tablet Take 1 tablet by mouth daily. 03/04/16  Yes Historical Provider, MD   Obstetric History: She is a G82P0020 female s/p EAB x 2.    Gynecologic History:   Menarche age: 52 Cycle Hx: flow is excessive, cycles regular. Duration 10 days. Last pap: 3 years ago.  Reports that it was normal  Social History:  She  reports that she has never smoked. She has never used smokeless tobacco. She reports that she  drinks alcohol. She reports that she does not use drugs.  Family History:  family history includes Clotting disorder in her maternal grandmother; Diabetes in her mother; Glaucoma in her mother; Heart disease in her father; Hypertension in her mother.   Review of Systems:  Negative x 10 systems reviewed except as noted in the HPI.    Objective    BP 129/74 (BP Location: Right Arm)   Pulse 96   Temp 97.4 F (36.3 C) (Oral)   Resp 20   Ht 5\' 6"  (1.676 m)   Wt 185 lb (83.9 kg)   LMP 03/04/2016   SpO2 98%   BMI 29.86 kg/m  Physical Exam  General:  She is a well appearing female in no distress.  HEENT:  Normocephalic, atraumatic.   Neck:  supple, no lymphadenopathy Cardiac:  Tachycardia with regular rhthym Pulmonary:  Clear to auscultation bilaterally. No wheezes, rales, rhonchi.   Abdomen:  Soft, non-tender, non-distended. +BS.  Large mass extending from her pelvis to about 3cm below the costal margin bilaterally. It is minimally mobile.   Pelvic:  Deferred at this time. Extremities:  Non-tender, symmetric no edema bilaterally.   Neurologic:  Alert & oriented x 3.  Appropriate, conversant.    Laboratory Results:   Lab Results  Component Value Date   WBC 8.0 03/17/2016   RBC 3.64 (L) 03/17/2016   HGB 7.1 (L) 03/17/2016   HCT 23.8 (L) 03/17/2016   PLT 492 (H) 03/17/2016   NA 136 03/17/2016   K 3.5 03/17/2016   CREATININE 0.92 03/17/2016   Imaging Results:  Ct Angio Chest Pe W And/or Wo Contrast  Result Date: 03/17/2016 CLINICAL DATA:  Chest pain and dyspnea.  Tachycardia. EXAM: CT ANGIOGRAPHY CHEST WITH CONTRAST TECHNIQUE: Multidetector CT imaging of the chest was performed using the standard protocol during bolus administration of intravenous contrast. Multiplanar CT image reconstructions and MIPs were obtained to evaluate the vascular anatomy. CONTRAST:  75 mL Isovue 370 intravenous COMPARISON:  12/17/2004 FINDINGS: Cardiovascular: There are acute pulmonary emboli to the upper  lobes and lower lobes bilaterally, small to moderate volume. No central emboli. No CT evidence of right heart strain. Mediastinum/Nodes: No enlarged mediastinal, hilar, or axillary lymph nodes. Thyroid gland, trachea, and esophagus demonstrate no significant findings. Lungs/Pleura: Multifocal mild patchy airspace opacities, peripheral and possibly representing small areas of infarction. No dense consolidation. No effusions. Airways are patent and normal in caliber. 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 No suspicious focal liver lesion. Musculoskeletal: No significant skeletal lesion. Review of the MIP images confirms the above findings. IMPRESSION: Multiple pulmonary emboli to the upper lobes and lower lobes bilaterally. No CT evidence of right heart strain. These results were called by telephone at the time of interpretation on 03/17/2016 at 6:40 am to Dr. Marjean Donna , who verbally acknowledged these results. Electronically Signed   By: Quillian Quince  Armandina Stammer M.D.   On: 03/17/2016 06:43   US Venous Img Lower Unilateral Right  Result Date: 03/17/2016 CLINICAL DATA:  Right calf pain for 1 week EXAM: Right LOWER EXTREMITY VENOUS DOPPLER ULTRASOUND TECHNIQUE: Gray-scale sonography with graded compression, as well as color Doppler and duplex ultrasound were performed to evaluate the lower extremity deep venous systems from the level of the common femoral vein and including the common femoral, femoral, profunda femoral, popliteal and calf veins including the posterior tibial, peroneal and gastrocnemius veins when visible. The superficial great saphenous vein was also interrogated. Spectral Doppler was utilized to evaluate flow at rest and with distal augmentation maneuvers in the common femoral, femoral and popliteal veins. COMPARISON:  None. FINDINGS: Contralateral Common Femoral Vein: Respiratory phasicity is normal and symmetric with the symptomatic side. No evidence of thrombus.  Normal compressibility. Common Femoral Vein: No evidence of thrombus. Normal compressibility, respiratory phasicity and response to augmentation. Saphenofemoral Junction: No evidence of thrombus. Normal compressibility and flow on color Doppler imaging. Profunda Femoral Vein: No evidence of thrombus. Normal compressibility and flow on color Doppler imaging. Femoral Vein: No evidence of thrombus. Normal compressibility, respiratory phasicity and response to augmentation. Popliteal Vein: There is intraluminal thrombus, although not completely occlusive. Some flow gets around the intraluminal thrombus. Calf Veins: Intraluminal thrombus, partially to completely occlusive. Superficial Great Saphenous Vein: No evidence of thrombus. Normal compressibility and flow on color Doppler imaging. Venous Reflux:  None. Other Findings:  None. IMPRESSION: Deep venous thrombosis of the calf veins and popliteal vein. Indeterminate chronicity but it more likely is recent. These results Kaitlin be called to the ordering clinician or representative by the Radiologist Assistant, and communication documented in the PACS or zVision Dashboard. Electronically Signed   By: Andreas Newport M.D.   On: 03/17/2016 04:45      Assessment & Recommendations   Kaitlin Brown is a 51 y.o. G2P0020 premenopausal female with symptomatic anemia due to a fibroid uterus who was admitted for anemia and pulmonary emboli with deep venous thrombosis of the calf veins and popliteal vein being seen in consultation.    Recommendations:  1.  Abnormal uterine bleeding (menorrhagia without regular cycle) due to fibroid uterus:   - Recommend regimen of progesterone to get bleeding under better control.  Initially provera 10mg  po tid for 1 week. Then may switch to either provera 10mg  po daily or norethindrone 5mg  po daily.   - Follow up at [facility] for long-term management of her bleeding, which may include surgery.  She may also follow up with me at  [place], if she prefers.    - I reiterated to her the importance of follow up and assessment of her enlarging fibroid uterus.    - Discussed how progesterone therapy works to decrease bleeding and that she may initially experience heavier than normal bleeding and bleeding that does not line up when she would normally have a menses.  Her bleeding should become lighter in the long run, if she consistently takes the medication.   - Recommend imaging her uterus by CT or MRI (ultrasound may not be able to fully visualize the extent of the size of her uterus).  There is a marked increase in size of her uterus since my exam in July and that performed by [facility] shortly after.  If her fibroid has continued to rapidly enlarge, this may be a strong consideration for her long-term management.   2.  DVT/PE: Per the primary team for anticoagulation and general  medical management of her acute issues. 3. Chronic blood loss anemia with current acute anemia due to recent vaginal bleeding: Transfuse per the primary team.   I spent 30 minutes with the patient with > 50% spent in face-to-face discussion with the patient regarding management of her fibroid uterus and heavy vaginal bleeding.  Thank you for this consult. I appreciate the opportunity to participate in the care of your patient.    Kaitlin Bonnet, MD 03/17/2016 10:44 AM

## 2016-03-18 LAB — HEMOGLOBIN A1C
Hgb A1c MFr Bld: <4.2 % — ABNORMAL LOW (ref 4.8–5.6)
Mean Plasma Glucose: <74 mg/dL

## 2016-03-18 LAB — CBC
HCT: 29 % — ABNORMAL LOW (ref 35.0–47.0)
Hemoglobin: 8.7 g/dL — ABNORMAL LOW (ref 12.0–16.0)
MCH: 21.5 pg — AB (ref 26.0–34.0)
MCHC: 30 g/dL — ABNORMAL LOW (ref 32.0–36.0)
MCV: 71.6 fL — AB (ref 80.0–100.0)
PLATELETS: 400 10*3/uL (ref 150–440)
RBC: 4.04 MIL/uL (ref 3.80–5.20)
RDW: 23.9 % — ABNORMAL HIGH (ref 11.5–14.5)
WBC: 5.9 10*3/uL (ref 3.6–11.0)

## 2016-03-18 LAB — BASIC METABOLIC PANEL
Anion gap: 5 (ref 5–15)
BUN: 11 mg/dL (ref 6–20)
CHLORIDE: 109 mmol/L (ref 101–111)
CO2: 25 mmol/L (ref 22–32)
Calcium: 8.7 mg/dL — ABNORMAL LOW (ref 8.9–10.3)
Creatinine, Ser: 0.75 mg/dL (ref 0.44–1.00)
GFR calc Af Amer: >60 mL/min (ref >60)
GLUCOSE: 102 mg/dL — AB (ref 65–99)
POTASSIUM: 4.3 mmol/L (ref 3.5–5.1)
Sodium: 139 mmol/L (ref 135–145)

## 2016-03-18 LAB — TYPE AND SCREEN
BLOOD PRODUCT EXPIRATION DATE: 201801142359
Blood Product Expiration Date: 201801142359
ISSUE DATE / TIME: 201801011413
ISSUE DATE / TIME: 201801011808
Unit Type and Rh: 9500
Unit Type and Rh: 9500

## 2016-03-18 LAB — HOMOCYSTEINE: Homocysteine: 4.4 umol/L (ref 0.0–15.0)

## 2016-03-18 MED ORDER — HYDROCODONE-ACETAMINOPHEN 5-325 MG PO TABS: 1.0000 | ORAL_TABLET | Freq: Four times a day (QID) | ORAL | 0 refills | Status: DC | PRN

## 2016-03-18 MED ORDER — MEDROXYPROGESTERONE ACETATE 10 MG PO TABS: 10.0000 mg | ORAL_TABLET | Freq: Every day | ORAL | 0 refills | Status: DC

## 2016-03-18 MED ORDER — APIXABAN 5 MG PO TABS: 5.0000 mg | ORAL_TABLET | Freq: Two times a day (BID) | ORAL | 0 refills | Status: DC

## 2016-03-18 MED ORDER — APIXABAN 5 MG PO TABS
10.0000 mg | ORAL_TABLET | Freq: Once | ORAL | Status: AC
  Administered 2016-03-18: 10 mg via ORAL
  Filled 2016-03-18: qty 2

## 2016-03-18 NOTE — Discharge Summary (Signed)
Patient discharge was completed Hospital doctor. Patient stated her understanding of her discharge instructions and said she does not have any questions at the time of discharge. Patient was transported by wheelchair to the front lobby where she was picked up by family member.

## 2016-03-18 NOTE — Progress Notes (Signed)
Pts transportation does not get here in time for her to pick Eliquis from pharmacy supplied by hospital. MD notified for a one time dose prior to discharge. I will continue to assess.

## 2016-03-18 NOTE — Progress Notes (Signed)
[place], [place].   03/18/2016  Patient: Kaitlin Brown   Date of Birth:  Mar 16, 1966  Date of admission:  03/17/2016  Date of Discharge  03/18/2016    To Whom it May Concern:   Sherin Quarry  may return to work on 03/27/2015.  PHYSICAL ACTIVITY:  Full  Patient was accompanied by her friend [name] in the hospital.  If you have any questions or concerns, please don't hesitate to call.  Sincerely,   Hillary Bow R M.D Office : [phone]   .

## 2016-03-18 NOTE — Care Management (Addendum)
Patient without insurance.  she is admitted with bilateral pulmonary embolus.  To discharge on Eliquis.  Provided patient with 30 day trial coupon and application for [facility].  Made patient an eligibility appointment at the clinic for 1.9.2018 at 9A. [facility].  has a small supply of Eliquis on hand and will be able to give patient some.  Faxed the script.  Another script was e scribed to [facility] on [address]. Instructed patient to use the 30day coupon for that script. Very firmly discussed the need to keep [facility] appt as the Eliquis is a lifesaving medication.  she verbalized understanding. Updated primary nurse

## 2016-03-19 LAB — PROTEIN C ACTIVITY: Protein C Activity: 72 % — ABNORMAL LOW (ref 73–180)

## 2016-03-19 LAB — LUPUS ANTICOAGULANT PANEL
DRVVT: 29.8 s (ref 0.0–47.0)
PTT Lupus Anticoagulant: 26.3 s (ref 0.0–51.9)

## 2016-03-19 LAB — PROTEIN S, TOTAL: Protein S Ag, Total: 67 % (ref 60–150)

## 2016-03-19 LAB — PROTEIN C, TOTAL: Protein C, Total: 68 % (ref 60–150)

## 2016-03-19 LAB — PROTEIN S ACTIVITY: Protein S Activity: 73 % (ref 63–140)

## 2016-03-20 LAB — PROTHROMBIN GENE MUTATION

## 2016-03-21 LAB — FACTOR 5 LEIDEN

## 2016-03-22 LAB — BETA-2-GLYCOPROTEIN I ABS, IGG/M/A
Beta-2 Glyco I IgG: <9 GPI IgG units (ref 0–20)
Beta-2-Glycoprotein I IgA: <9 GPI IgA units (ref 0–25)
Beta-2-Glycoprotein I IgM: <9 GPI IgM units (ref 0–32)

## 2016-03-24 LAB — CARDIOLIPIN ANTIBODIES, IGG, IGM, IGA
Anticardiolipin IgA: <9 APL U/mL (ref 0–11)
Anticardiolipin IgG: <9 GPL U/mL (ref 0–14)
Anticardiolipin IgM: 9 MPL U/mL (ref 0–12)

## 2016-03-25 ENCOUNTER — Ambulatory Visit: Payer: PRIVATE HEALTH INSURANCE

## 2016-03-25 NOTE — Discharge Summary (Signed)
[place] at [place] NAME: Kaitlin Brown    MR#:  [id number]  DATE OF BIRTH:  [birth year]  DATE OF ADMISSION:  03/17/2016 ADMITTING PHYSICIAN: Harrie Foreman, MD  DATE OF DISCHARGE: 03/18/2016  8:10 PM  PRIMARY CARE PHYSICIAN: No PCP Per Patient   ADMISSION DIAGNOSIS:  Dysfunctional uterine bleeding [N93.8] Acute deep vein thrombosis (DVT) of popliteal vein of right lower extremity (HCC) [I82.431] Symptomatic anemia [D64.9] Uterine leiomyoma, unspecified location [D25.9]  DISCHARGE DIAGNOSIS:  Principal Problem:   Acute pulmonary embolism (HCC) Active Problems:   Symptomatic anemia   Fibroid uterus   Anemia due to blood loss, chronic   DVT (deep venous thrombosis) ([place])   SECONDARY DIAGNOSIS:   Past Medical History:  Diagnosis Date  . Anemia due to blood loss, chronic    due to menorrhagia  . Back pain   . Bladder infection   . Chicken pox   . Fibroid uterus   . Hypertension      ADMITTING HISTORY  Chief Complaint: Vaginal bleeding HPI: The patient presents emergency department complaining of painful and heavy vaginal bleeding. It occurs only during her menstrual cycle. She has known fibroid tumors as well as anemia. She received a blood transfusion 5 months ago but admits to not continuing her iron supplements as directed. In the emergency department she also complained of chest pain as well as right lower extremity pain. Ultrasound of the extremity showed nonocclusive extensive DVT and CT angiogram of her chest showed multiple peripheral pulmonary emboli. Laboratory evaluation showed hemoglobin of 7.1, all of which prompted the emergency department staff to call the hospitalist service for admission.  HOSPITAL COURSE:   51 year old female with past medical history significant for uterine fibroid, chronic anemia, hypertension and low back pain presents to the hospital secondary to chest discomfort and weakness.  #1  Acute on chronic anemia-has no iron deficiency anemia from dysmenorrhea. -Was not taking iron supplements. Hemoglobin dropped from 9 to 7. Also having dysmenorrhea. -2 units packed RBC ordered in the emergency room. Continue iron supplements as outpatient.  #2 Right leg DVT and PE-was taking hormonal supplements for her dysmenorrhea, also family history of blood clots. -Hypercoagulable workup sent for.  Started on therapeutic Lovenox. Switch to Eliquis at discharge.  #3 uterine fibroid and dysmenorrhea-patient has adamantly refused hysterectomy in the past and currently. Plan is to start on high-dose progestin supplements to shrink the fibroid and follow-up at [facility]. -Hysterectomy again advised.  #4 hypertension-continue Toprol, lisinopril and hydrochlorothiazide  Patient discharged home in stable condition. She will be on Eliquis and iron supplements. She needs to follow-up with [facility] for treatment of fibroids. Advised she needs to return to the emergency room or call her gynecology doctor's office if there is any significant bleeding  CONSULTS OBTAINED:  Treatment Team:  Will Bonnet, MD  DRUG ALLERGIES:  No Known Allergies  DISCHARGE MEDICATIONS:   Discharge Medication List as of 03/18/2016  6:13 PM    START taking these medications   Details  apixaban (ELIQUIS) 5 MG TABS tablet Take 1 tablet (5 mg total) by mouth 2 (two) times daily. 10mg  BID for 1 week and then 5 mg BID., Starting Tue 03/18/2016, Normal    HYDROcodone-acetaminophen (NORCO) 5-325 MG tablet Take 1 tablet by mouth every 6 (six) hours as needed for severe pain., Starting Tue 03/18/2016, Print    medroxyPROGESTERone (PROVERA) 10 MG tablet Take 1 tablet (10 mg total) by mouth daily. 10 mg  TID for 5 days and then 10 mg Daily, Starting Tue 03/18/2016, Normal      CONTINUE these medications which have NOT CHANGED   Details  ferrous gluconate (FERGON) 324 MG tablet Take 1 tablet (324 mg total) by mouth daily with  breakfast., Starting Sat 09/29/2015, Print    lisinopril-hydrochlorothiazide (PRINZIDE,ZESTORETIC) 10-12.5 MG per tablet Take 1 tablet by mouth daily., Historical Med      STOP taking these medications     norethindrone (AYGESTIN) 5 MG tablet      fluticasone (FLONASE) 50 MCG/ACT nasal spray      metoprolol succinate (TOPROL-XL) 25 MG 24 hr tablet         Today   VITAL SIGNS:  Blood pressure 110/63, pulse 83, temperature 98.6 F (37 C), temperature source Oral, resp. rate 20, height 5\' 6"  (1.676 m), weight 89.7 kg (197 lb 11.2 oz), last menstrual period 03/04/2016, SpO2 94 %.  I/O:  No intake or output data in the 24 hours ending 03/25/16 1254  PHYSICAL EXAMINATION:  Physical Exam  GENERAL:  51 y.o.-year-old patient lying in the bed with no acute distress.  LUNGS: Normal breath sounds bilaterally, no wheezing, rales,rhonchi or crepitation. No use of accessory muscles of respiration.  CARDIOVASCULAR: S1, S2 normal. No murmurs, rubs, or gallops.  ABDOMEN: Soft, non-tender, non-distended. Bowel sounds present. No organomegaly or mass.  NEUROLOGIC: Moves all 4 extremities. PSYCHIATRIC: The patient is alert and oriented x 3.  SKIN: No obvious rash, lesion, or ulcer.   DATA REVIEW:   CBC No results for input(s): WBC, HGB, HCT, PLT in the last 168 hours.  Chemistries  No results for input(s): NA, K, CL, CO2, GLUCOSE, BUN, CREATININE, CALCIUM, MG, AST, ALT, ALKPHOS, BILITOT in the last 168 hours.  Invalid input(s): GFRCGP  Cardiac Enzymes No results for input(s): TROPONINI in the last 168 hours.  Microbiology Results  Results for orders placed or performed during the hospital encounter of 09/28/15  Culture, group A strep     Status: None   Collection Time: 09/28/15  1:31 PM  Result Value Ref Range Status   Specimen Description THROAT  Final   Special Requests NONE  Final   Culture   Final    NO GROUP A STREP (S.PYOGENES) ISOLATED Performed at [facility]     Report Status 09/30/2015 FINAL  Final  Wet prep, genital     Status: None   Collection Time: 09/28/15  3:30 PM  Result Value Ref Range Status   Yeast Wet Prep HPF POC NONE SEEN NONE SEEN Final   Trich, Wet Prep NONE SEEN NONE SEEN Final   Clue Cells Wet Prep HPF POC NONE SEEN NONE SEEN Final   WBC, Wet Prep HPF POC NONE SEEN NONE SEEN Final   Sperm NONE SEEN  Final  Chlamydia/NGC rt PCR ([facility] only)     Status: None   Collection Time: 09/28/15  3:30 PM  Result Value Ref Range Status   Specimen source GC/Chlam ENDOCERVICAL  Final   Chlamydia Tr NOT DETECTED NOT DETECTED Final   N gonorrhoeae NOT DETECTED NOT DETECTED Final    Comment: (NOTE) 100  This methodology has not been evaluated in pregnant women or in 200  patients with a history of hysterectomy. 300 400  This methodology will not be performed on patients less than 58  years of age.     RADIOLOGY:  No results found.  Follow up with PCP in 1 week.  Management plans discussed with  the patient, family and they are in agreement.  CODE STATUS:  Code Status History    Date Active Date Inactive Code Status Order ID Comments User Context   03/17/2016  7:43 AM 03/18/2016 11:22 PM Full Code [id number]  Harrie Foreman, MD Inpatient   09/28/2015  7:34 PM 09/29/2015  4:36 PM Full Code [id number]  Lytle Butte, MD ED      TOTAL TIME TAKING CARE OF THIS PATIENT ON DAY OF DISCHARGE: more than 30 minutes.   Hillary Bow R M.D on 03/25/2016 at 12:54 PM  Between 7am to 6pm - Pager - [phone]  After 6pm go to [email or web address] - password [email or web address] [place] [facility]  Office  [phone]  CC: Primary care physician; No PCP Per Patient  Note: This dictation was prepared with Dragon dictation along with smaller phrase technology. Any transcriptional errors that result from this process are unintentional.

## 2016-04-02 ENCOUNTER — Ambulatory Visit: Payer: PRIVATE HEALTH INSURANCE

## 2016-04-27 ENCOUNTER — Emergency Department
Admission: EM | Admit: 2016-04-27 | Discharge: 2016-04-27 | Disposition: A | Discharge status: Home / Self Care | Payer: PRIVATE HEALTH INSURANCE | Attending: Emergency Medicine | Admitting: Emergency Medicine

## 2016-04-27 ENCOUNTER — Emergency Department: Payer: PRIVATE HEALTH INSURANCE

## 2016-04-27 DIAGNOSIS — Z79899 Other long term (current) drug therapy: Secondary | ICD-10-CM | POA: Insufficient documentation

## 2016-04-27 DIAGNOSIS — N938 Other specified abnormal uterine and vaginal bleeding: Secondary | ICD-10-CM

## 2016-04-27 DIAGNOSIS — R600 Localized edema: Secondary | ICD-10-CM

## 2016-04-27 DIAGNOSIS — D259 Leiomyoma of uterus, unspecified: Secondary | ICD-10-CM

## 2016-04-27 DIAGNOSIS — D500 Iron deficiency anemia secondary to blood loss (chronic): Secondary | ICD-10-CM | POA: Insufficient documentation

## 2016-04-27 DIAGNOSIS — I10 Essential (primary) hypertension: Secondary | ICD-10-CM | POA: Insufficient documentation

## 2016-04-27 DIAGNOSIS — R609 Edema, unspecified: Secondary | ICD-10-CM | POA: Insufficient documentation

## 2016-04-27 LAB — CBC
HCT: 22.5 % — ABNORMAL LOW (ref 35.0–47.0)
Hemoglobin: 6.7 g/dL — ABNORMAL LOW (ref 12.0–16.0)
MCH: 21.7 pg — ABNORMAL LOW (ref 26.0–34.0)
MCHC: 29.9 g/dL — ABNORMAL LOW (ref 32.0–36.0)
MCV: 72.6 fL — AB (ref 80.0–100.0)
PLATELETS: 451 10*3/uL — AB (ref 150–440)
RBC: 3.1 MIL/uL — ABNORMAL LOW (ref 3.80–5.20)
RDW: 20.2 % — AB (ref 11.5–14.5)
WBC: 4.2 10*3/uL (ref 3.6–11.0)

## 2016-04-27 LAB — BASIC METABOLIC PANEL
Anion gap: 7 (ref 5–15)
BUN: 6 mg/dL (ref 6–20)
CALCIUM: 8.3 mg/dL — AB (ref 8.9–10.3)
CO2: 24 mmol/L (ref 22–32)
CREATININE: 0.64 mg/dL (ref 0.44–1.00)
Chloride: 107 mmol/L (ref 101–111)
GFR calc Af Amer: >60 mL/min (ref >60)
Glucose, Bld: 86 mg/dL (ref 65–99)
Potassium: 4.1 mmol/L (ref 3.5–5.1)
SODIUM: 138 mmol/L (ref 135–145)

## 2016-04-27 LAB — BRAIN NATRIURETIC PEPTIDE: B NATRIURETIC PEPTIDE 5: 27 pg/mL (ref 0.0–100.0)

## 2016-04-27 NOTE — ED Notes (Signed)
Pt alert and oriented X4, active, cooperative, pt in NAD. RR even and unlabored, color WNL.  Pt informed to return if any life threatening symptoms occur.   

## 2016-04-27 NOTE — Discharge Instructions (Signed)
Ultrasounds of your legs today were unremarkable and show improvement of the blood clots that were previously there.  Follow up with your doctor for continued monitoring of your symptoms.  Keep taking your iron supplement, Eliquis, Provera, and other medications as prescribed.

## 2016-04-27 NOTE — ED Provider Notes (Signed)
[facility] Emergency Department Provider Note  ____________________________________________  Time seen: Approximately 12:23 PM  I have reviewed the triage vital signs and the nursing notes.   HISTORY  Chief Complaint Foot Swelling    HPI Kaitlin Brown is a 51 y.o. female who complains of swelling in bilateral feet for the past 3 days. This occurred after she restarted Provera which she had run out of.  The patient has a history of uterine fibroids with menorrhagia. She previously been seeing gynecology at [facility], but has recently had a loss of employment and loss of health insurance. She is following up with [facility] for ongoing primary care. They have her on Provera to help manage the menorrhagia. She is also taking iron supplements for chronic anemia, Eliquis for DVT. She reports compliance with all these medications. She ran out of the Provera for 3 days which resulted in some vaginal bleeding, but then Wednesday she started taking it again, and later that evening noted that her feet were swelling a little bit. She denies any chest pain shortness of breath fevers chills dizziness lightheadedness or syncope. Surgeon. Normal exercise tolerance.     Past Medical History:  Diagnosis Date  . Anemia due to blood loss, chronic    due to menorrhagia  . Back pain   . Bladder infection   . Chicken pox   . Fibroid uterus   . Hypertension      Patient Active Problem List   Diagnosis Date Noted  . Acute pulmonary embolism ([place]) 03/17/2016  . DVT (deep venous thrombosis) ([place]) 03/17/2016  . Fibroid uterus   . Anemia due to blood loss, chronic   . Symptomatic anemia 09/28/2015  . Uterine mass 09/28/2015     Past Surgical History:  Procedure Laterality Date  . HYSTEROSCOPY W/D&C  10/2014   [facility], Dr Guadalupe Maple  . none       Prior to Admission medications   Medication Sig Start Date End Date Taking? Authorizing Provider  apixaban  (ELIQUIS) 5 MG TABS tablet Take 1 tablet (5 mg total) by mouth 2 (two) times daily. 10mg  BID for 1 week and then 5 mg BID. 03/18/16   Hillary Bow, MD  ferrous gluconate (FERGON) 324 MG tablet Take 1 tablet (324 mg total) by mouth daily with breakfast. 09/29/15   Lytle Butte, MD  HYDROcodone-acetaminophen (NORCO) 5-325 MG tablet Take 1 tablet by mouth every 6 (six) hours as needed for severe pain. 03/18/16   Srikar Sudini, MD  lisinopril-hydrochlorothiazide (PRINZIDE,ZESTORETIC) 10-12.5 MG per tablet Take 1 tablet by mouth daily.    Historical Provider, MD  medroxyPROGESTERone (PROVERA) 10 MG tablet Take 1 tablet (10 mg total) by mouth daily. 10 mg TID for 5 days and then 10 mg Daily 03/18/16   Hillary Bow, MD     Allergies Patient has no known allergies.   Family History  Problem Relation Age of Onset  . Hypertension Mother   . Diabetes Mother   . Glaucoma Mother   . Heart disease Father     Congestive heart condition  . Clotting disorder Maternal Grandmother     Social History Social History  Substance Use Topics  . Smoking status: Never Smoker  . Smokeless tobacco: Never Used  . Alcohol use Yes     Comment: occasionally    Review of Systems  Constitutional:   No fever or chills.  ENT:   No sore throat. No rhinorrhea. Cardiovascular:   No chest pain. Respiratory:  No dyspnea or cough. Gastrointestinal:   Chronic abdominal pain. No constipation. No vomiting..  Genitourinary:   Negative for dysuria or difficulty urinating. Musculoskeletal:   Negative for focal pain . Intermittent pedal edema bilaterally, improved with leg elevation Neurological:   Negative for headaches 10-point ROS otherwise negative.  ____________________________________________   PHYSICAL EXAM:  VITAL SIGNS: ED Triage Vitals [04/27/16 0939]  Enc Vitals Group     BP 133/79     Pulse Rate (!) 105     Resp 18     Temp 98.8 F (37.1 C)     Temp Source Oral     SpO2 100 %     Weight 197 lb (89.4  kg)     Height      Head Circumference      Peak Flow      Pain Score      Pain Loc      Pain Edu?      Excl. in [place]?     Vital signs reviewed, nursing assessments reviewed.   Constitutional:   Alert and oriented. Well appearing and in no distress. Eyes:   No scleral icterus. No conjunctival pallor. PERRL. EOMI.  No nystagmus. ENT   Head:   Normocephalic and atraumatic.   Nose:   No congestion/rhinnorhea. No septal hematoma   Mouth/Throat:   MMM, no pharyngeal erythema. No peritonsillar mass.    Neck:   No stridor. No SubQ emphysema. No meningismus. Hematological/Lymphatic/Immunilogical:   No cervical lymphadenopathy. Cardiovascular:   RRR. Symmetric bilateral radial and DP pulses.  No murmurs.  Respiratory:   Normal respiratory effort without tachypnea nor retractions. Breath sounds are clear and equal bilaterally. No wheezes/rales/rhonchi. Gastrointestinal:   Soft and nontender.Uterus is significantly enlarged to above the umbilicus with irregular contour. Nontender. Non distended. There is no CVA tenderness.  No rebound, rigidity, or guarding. Genitourinary:   deferred Musculoskeletal:   Nontender with normal range of motion in all extremities. No joint effusions.  No lower extremity tenderness.  Trace pedal edema bilaterally. Negative Homans sign. No calf tenderness.. Neurologic:   Normal speech and language.  CN 2-10 normal. Motor grossly intact. No gross focal neurologic deficits are appreciated.  Skin:    Skin is warm, dry and intact. No rash noted.  No petechiae, purpura, or bullae.  ____________________________________________    LABS (pertinent positives/negatives) (all labs ordered are listed, but only abnormal results are displayed) Labs Reviewed  BASIC METABOLIC PANEL - Abnormal; Notable for the following:       Result Value   Calcium 8.3 (*)    All other components within normal limits  CBC - Abnormal; Notable for the following:    RBC 3.10 (*)     Hemoglobin 6.7 (*)    HCT 22.5 (*)    MCV 72.6 (*)    MCH 21.7 (*)    MCHC 29.9 (*)    RDW 20.2 (*)    Platelets 451 (*)    All other components within normal limits  BRAIN NATRIURETIC PEPTIDE   ____________________________________________   EKG    ____________________________________________    RADIOLOGY  Ultrasound bilateral lower extremities unremarkable. Shows resolution of previously identified DVT.  ____________________________________________   PROCEDURES Procedures  ____________________________________________   INITIAL IMPRESSION / ASSESSMENT AND PLAN / ED COURSE  Pertinent labs & imaging results that were available during my care of the patient were reviewed by me and considered in my medical decision making (see chart for details).  Patient well appearing no acute  distress. Presents with swelling of feet which I think is due to side effect of the Provera as well as mechanical obstruction to venous blood return from legs due to her severely enlarged fibroid uterus. Counseled the patient on leg elevation and continuing ambulation frequently. Patient has no symptoms of PE or anticoagulation failure and has been compliant with medications. She does have a hemoglobin of about 7 which is similar to her chronic baseline and I think due to her chronic menorrhagia. Encouraged her to continue following up with primary care and gynecology when able. She reports she is exhibiting paperwork for [place] care program so that she can continue her management with [facility].  Low suspicion for DVT PE heart failure intestinal obstruction or perforation. No evidence of infection. No acute hemorrhage, no shock       ____________________________________________   FINAL CLINICAL IMPRESSION(S) / ED DIAGNOSES  Final diagnoses:  Iron deficiency anemia due to chronic blood loss  Uterine leiomyoma, unspecified location  Dysfunctional uterine bleeding  Pedal edema      New  Prescriptions   No medications on file     Portions of this note were generated with dragon dictation software. Dictation errors may occur despite best attempts at proofreading.    Carrie Mew, MD 04/27/16 1229

## 2016-04-27 NOTE — ED Triage Notes (Signed)
Pt arrives to ER via POV c/o bilateral foot swelling X 3 days. Pt reports that she takes Eliquis for hx of blood clots in legs. Pt no hx of CHF. Pt reports that she was taking medroxyprogesterone, was off of it "a few days". Pt reports that once she began taking them again on Wednesday her feet began to swell. Pt alert and oriented X4, active, cooperative, pt in NAD. RR even and unlabored, color WNL.  No SOB.

## 2016-04-30 ENCOUNTER — Ambulatory Visit: Payer: Self-pay | Admitting: Pharmacy Technician

## 2016-04-30 DIAGNOSIS — Z79899 Other long term (current) drug therapy: Secondary | ICD-10-CM

## 2016-05-01 NOTE — Progress Notes (Signed)
Met with patient completed financial assistance application for [facility] due to recent hospital visit.  Patient agreed to be responsible for gathering financial information and forwarding to appropriate department in [facility].    Completed [facility] application and contract.  Patient agreed to all terms of the [facility] contract.  Provided patient with community resource material based on her particular needs.   Patient approved to received medication assistance from [facility] as long as there are no changes in income or insurance.   Eliquis Prescription Application completed with patient.  Forwarded to Gennette Pac, NP for signature.  Upon receipt of signed application from provider,Eliquis Prescription Application will be submitted to [facility].  [facility]

## 2016-05-28 ENCOUNTER — Emergency Department
Admission: EM | Admit: 2016-05-28 | Discharge: 2016-05-28 | Disposition: A | Discharge status: Home / Self Care | Payer: Self-pay | Attending: Emergency Medicine | Admitting: Emergency Medicine

## 2016-05-28 ENCOUNTER — Emergency Department: Payer: Self-pay

## 2016-05-28 ENCOUNTER — Encounter: Payer: Self-pay | Admitting: Emergency Medicine

## 2016-05-28 DIAGNOSIS — R079 Chest pain, unspecified: Secondary | ICD-10-CM | POA: Insufficient documentation

## 2016-05-28 DIAGNOSIS — Z79899 Other long term (current) drug therapy: Secondary | ICD-10-CM | POA: Insufficient documentation

## 2016-05-28 DIAGNOSIS — I10 Essential (primary) hypertension: Secondary | ICD-10-CM | POA: Insufficient documentation

## 2016-05-28 DIAGNOSIS — R0602 Shortness of breath: Secondary | ICD-10-CM | POA: Insufficient documentation

## 2016-05-28 LAB — CBC
HCT: 27.9 % — ABNORMAL LOW (ref 35.0–47.0)
HEMOGLOBIN: 8.1 g/dL — AB (ref 12.0–16.0)
MCH: 20.6 pg — AB (ref 26.0–34.0)
MCHC: 28.9 g/dL — AB (ref 32.0–36.0)
MCV: 71.3 fL — ABNORMAL LOW (ref 80.0–100.0)
PLATELETS: 556 10*3/uL — AB (ref 150–440)
RBC: 3.91 MIL/uL (ref 3.80–5.20)
RDW: 18.7 % — AB (ref 11.5–14.5)
WBC: 4.1 10*3/uL (ref 3.6–11.0)

## 2016-05-28 LAB — BASIC METABOLIC PANEL
ANION GAP: 5 (ref 5–15)
BUN: 7 mg/dL (ref 6–20)
CALCIUM: 8.4 mg/dL — AB (ref 8.9–10.3)
CO2: 24 mmol/L (ref 22–32)
CREATININE: 0.69 mg/dL (ref 0.44–1.00)
Chloride: 106 mmol/L (ref 101–111)
GFR calc Af Amer: >60 mL/min (ref >60)
GLUCOSE: 94 mg/dL (ref 65–99)
Potassium: 3.7 mmol/L (ref 3.5–5.1)
Sodium: 135 mmol/L (ref 135–145)

## 2016-05-28 LAB — TROPONIN I

## 2016-05-28 LAB — FIBRIN DERIVATIVES D-DIMER (ARMC ONLY): FIBRIN DERIVATIVES D-DIMER (ARMC): 220.14 (ref 0.00–499.00)

## 2016-05-28 MED ORDER — ACETAMINOPHEN 500 MG PO TABS
ORAL_TABLET | ORAL | Status: AC
  Administered 2016-05-28: 1000 mg via ORAL
  Filled 2016-05-28: qty 2

## 2016-05-28 MED ORDER — ACETAMINOPHEN 500 MG PO TABS
1000.0000 mg | ORAL_TABLET | Freq: Once | ORAL | Status: AC
Start: 2016-05-28 — End: 2016-05-28
  Administered 2016-05-28: 1000 mg via ORAL

## 2016-05-28 MED ORDER — GI COCKTAIL ~~LOC~~
30.0000 mL | Freq: Once | ORAL | Status: AC
  Administered 2016-05-28: 30 mL via ORAL
  Filled 2016-05-28: qty 30

## 2016-05-28 NOTE — ED Notes (Signed)
PT DID NOT WANT TO HAVE BLOOD WORK COMPLETED OUT IN TRIAGE.

## 2016-05-28 NOTE — ED Provider Notes (Signed)
[facility] Emergency Department Provider Note  ____________________________________________   I have reviewed the triage vital signs and the nursing notes.   HISTORY  Chief Complaint Chest Pain   History limited by: Not Limited   HPI Kaitlin Brown is a 51 y.o. female who presents to the emergency department today because of concerns for chest pain. She describes as being located in the left side. Started yesterday. It has been somewhat intermittent. It seems to be worse with movement. The patient has had some mild shortness of breath associated with this. No nausea or vomiting. Patient does have a history of blood clots and is on Eliquis, however did miss some doses last week. The patient denies any fevers.   Past Medical History:  Diagnosis Date  . Anemia due to blood loss, chronic    due to menorrhagia  . Back pain   . Bladder infection   . Chicken pox   . Fibroid uterus   . Hypertension     Patient Active Problem List   Diagnosis Date Noted  . Acute pulmonary embolism ([place]) 03/17/2016  . DVT (deep venous thrombosis) ([place]) 03/17/2016  . Fibroid uterus   . Anemia due to blood loss, chronic   . Symptomatic anemia 09/28/2015  . Uterine mass 09/28/2015    Past Surgical History:  Procedure Laterality Date  . HYSTEROSCOPY W/D&C  10/2014   [facility], Dr Guadalupe Maple  . none      Prior to Admission medications   Medication Sig Start Date End Date Taking? Authorizing Provider  apixaban (ELIQUIS) 5 MG TABS tablet Take 1 tablet (5 mg total) by mouth 2 (two) times daily. 10mg  BID for 1 week and then 5 mg BID. 03/18/16   Hillary Bow, MD  ferrous gluconate (FERGON) 324 MG tablet Take 1 tablet (324 mg total) by mouth daily with breakfast. 09/29/15   Lytle Butte, MD  HYDROcodone-acetaminophen (NORCO) 5-325 MG tablet Take 1 tablet by mouth every 6 (six) hours as needed for severe pain. 03/18/16   Srikar Sudini, MD  lisinopril-hydrochlorothiazide  (PRINZIDE,ZESTORETIC) 10-12.5 MG per tablet Take 1 tablet by mouth daily.    Historical Provider, MD  medroxyPROGESTERone (PROVERA) 10 MG tablet Take 1 tablet (10 mg total) by mouth daily. 10 mg TID for 5 days and then 10 mg Daily 03/18/16   Hillary Bow, MD    Allergies Patient has no known allergies.  Family History  Problem Relation Age of Onset  . Hypertension Mother   . Diabetes Mother   . Glaucoma Mother   . Heart disease Father     Congestive heart condition  . Clotting disorder Maternal Grandmother     Social History Social History  Substance Use Topics  . Smoking status: Never Smoker  . Smokeless tobacco: Never Used  . Alcohol use Yes     Comment: occasionally    Review of Systems  Constitutional: Negative for fever. Cardiovascular: Positive for chest pain. Respiratory: Positive for shortness of breath. Gastrointestinal: Negative for abdominal pain, vomiting and diarrhea. Neurological: Negative for headaches, focal weakness or numbness.  10-point ROS otherwise negative.  ____________________________________________   PHYSICAL EXAM:  VITAL SIGNS: ED Triage Vitals  Enc Vitals Group     BP 05/28/16 1547 (!) 142/83     Pulse Rate 05/28/16 1547 98     Resp 05/28/16 1547 20     Temp 05/28/16 1547 98 F (36.7 C)     Temp Source 05/28/16 1547 Oral     SpO2  05/28/16 1547 98 %     Weight 05/28/16 1548 197 lb (89.4 kg)     Height --      Head Circumference --      Peak Flow --      Pain Score 05/28/16 1547 2    Constitutional: Alert and oriented. Well appearing and in no distress. Eyes: Conjunctivae are normal. Normal extraocular movements. ENT   Head: Normocephalic and atraumatic.   Nose: No congestion/rhinnorhea.   Mouth/Throat: Mucous membranes are moist.   Neck: No stridor. Hematological/Lymphatic/Immunilogical: No cervical lymphadenopathy. Cardiovascular: Normal rate, regular rhythm.  No murmurs, rubs, or gallops.  Respiratory: Normal  respiratory effort without tachypnea nor retractions. Breath sounds are clear and equal bilaterally. No wheezes/rales/rhonchi. Gastrointestinal: Soft and non tender. No rebound. No guarding.  Genitourinary: Deferred Musculoskeletal: Normal range of motion in all extremities. No lower extremity edema. Neurologic:  Normal speech and language. No gross focal neurologic deficits are appreciated.  Skin:  Skin is warm, dry and intact. No rash noted. Psychiatric: Mood and affect are normal. Speech and behavior are normal. Patient exhibits appropriate insight and judgment.  ____________________________________________    LABS (pertinent positives/negatives)  Labs Reviewed  BASIC METABOLIC PANEL - Abnormal; Notable for the following:       Result Value   Calcium 8.4 (*)    All other components within normal limits  CBC - Abnormal; Notable for the following:    Hemoglobin 8.1 (*)    HCT 27.9 (*)    MCV 71.3 (*)    MCH 20.6 (*)    MCHC 28.9 (*)    RDW 18.7 (*)    Platelets 556 (*)    All other components within normal limits  TROPONIN I  FIBRIN DERIVATIVES D-DIMER ([facility] ONLY)     ____________________________________________   EKG  I, Nance Pear, attending physician, personally viewed and interpreted this EKG  EKG Time: 1544 Rate: 99 Rhythm: normal sinus rhythm Axis: normal Intervals: qtc 464 QRS: narrow ST changes: no st elevation, t wave inversion V1 Impression: abnormal ekg   ____________________________________________    RADIOLOGY  CXR   IMPRESSION:  No edema or consolidation.   ____________________________________________   PROCEDURES  Procedures  ____________________________________________   INITIAL IMPRESSION / ASSESSMENT AND PLAN / ED COURSE  Pertinent labs & imaging results that were available during my care of the patient were reviewed by me and considered in my medical decision making (see chart for details).  Patient presented to the  emergency department today because of concerns for chest pain that started yesterday. On exam patient appears quite well. No concerning lung or heart sounds. Moderate showed a negative troponin and d-dimer. This point I would expect some elevation in the troponin and if the pain represented ACS given the length of symptoms. Patient's pain did improve while she was here in the emergency department. Patient does have primary care who she can follow-up with.  ____________________________________________   FINAL CLINICAL IMPRESSION(S) / ED DIAGNOSES  Final diagnoses:  Nonspecific chest pain     Note: This dictation was prepared with Dragon dictation. Any transcriptional errors that result from this process are unintentional     Nance Pear, MD 05/28/16 1933

## 2016-05-28 NOTE — Discharge Instructions (Signed)
Please seek medical attention for any high fevers, chest pain, shortness of breath, change in behavior, persistent vomiting, bloody stool or any other new or concerning symptoms.  

## 2016-05-28 NOTE — ED Triage Notes (Signed)
Pt with left sided chest pain started yesterday.

## 2016-07-16 ENCOUNTER — Encounter: Payer: Self-pay | Admitting: Emergency Medicine

## 2016-07-16 ENCOUNTER — Inpatient Hospital Stay
Admission: EM | Admit: 2016-07-16 | Discharge: 2016-07-20 | DRG: 750 | Disposition: A | Discharge status: Home / Self Care | Payer: Self-pay | Attending: Obstetrics & Gynecology | Admitting: Obstetrics & Gynecology

## 2016-07-16 DIAGNOSIS — I10 Essential (primary) hypertension: Secondary | ICD-10-CM | POA: Diagnosis present

## 2016-07-16 DIAGNOSIS — D62 Acute posthemorrhagic anemia: Secondary | ICD-10-CM

## 2016-07-16 DIAGNOSIS — N840 Polyp of corpus uteri: Secondary | ICD-10-CM | POA: Diagnosis present

## 2016-07-16 DIAGNOSIS — Z86718 Personal history of other venous thrombosis and embolism: Secondary | ICD-10-CM

## 2016-07-16 DIAGNOSIS — D259 Leiomyoma of uterus, unspecified: Principal | ICD-10-CM | POA: Diagnosis present

## 2016-07-16 DIAGNOSIS — D649 Anemia, unspecified: Secondary | ICD-10-CM | POA: Diagnosis present

## 2016-07-16 DIAGNOSIS — D638 Anemia in other chronic diseases classified elsewhere: Secondary | ICD-10-CM

## 2016-07-16 DIAGNOSIS — Z7901 Long term (current) use of anticoagulants: Secondary | ICD-10-CM

## 2016-07-16 DIAGNOSIS — I82409 Acute embolism and thrombosis of unspecified deep veins of unspecified lower extremity: Secondary | ICD-10-CM

## 2016-07-16 DIAGNOSIS — R55 Syncope and collapse: Secondary | ICD-10-CM

## 2016-07-16 DIAGNOSIS — I2699 Other pulmonary embolism without acute cor pulmonale: Secondary | ICD-10-CM

## 2016-07-16 DIAGNOSIS — N938 Other specified abnormal uterine and vaginal bleeding: Secondary | ICD-10-CM | POA: Diagnosis present

## 2016-07-16 DIAGNOSIS — Z86711 Personal history of pulmonary embolism: Secondary | ICD-10-CM

## 2016-07-16 DIAGNOSIS — D500 Iron deficiency anemia secondary to blood loss (chronic): Secondary | ICD-10-CM | POA: Diagnosis present

## 2016-07-16 DIAGNOSIS — N920 Excessive and frequent menstruation with regular cycle: Secondary | ICD-10-CM | POA: Diagnosis present

## 2016-07-16 LAB — BASIC METABOLIC PANEL
ANION GAP: 8 (ref 5–15)
BUN: 6 mg/dL (ref 6–20)
CO2: 25 mmol/L (ref 22–32)
Calcium: 8.2 mg/dL — ABNORMAL LOW (ref 8.9–10.3)
Chloride: 106 mmol/L (ref 101–111)
Creatinine, Ser: 0.71 mg/dL (ref 0.44–1.00)
GFR calc Af Amer: >60 mL/min (ref >60)
GFR calc non Af Amer: >60 mL/min (ref >60)
Glucose, Bld: 119 mg/dL — ABNORMAL HIGH (ref 65–99)
POTASSIUM: 3 mmol/L — AB (ref 3.5–5.1)
Sodium: 139 mmol/L (ref 135–145)

## 2016-07-16 LAB — PROTIME-INR
INR: 0.98
Prothrombin Time: 13 seconds (ref 11.4–15.2)

## 2016-07-16 LAB — CBC
HEMATOCRIT: 18.6 % — AB (ref 35.0–47.0)
HEMOGLOBIN: 5.3 g/dL — AB (ref 12.0–16.0)
MCH: 19.7 pg — AB (ref 26.0–34.0)
MCHC: 28.5 g/dL — ABNORMAL LOW (ref 32.0–36.0)
MCV: 69.2 fL — ABNORMAL LOW (ref 80.0–100.0)
Platelets: 303 10*3/uL (ref 150–440)
RBC: 2.68 MIL/uL — ABNORMAL LOW (ref 3.80–5.20)
RDW: 18.1 % — ABNORMAL HIGH (ref 11.5–14.5)
WBC: 4.9 10*3/uL (ref 3.6–11.0)

## 2016-07-16 LAB — PREPARE RBC (CROSSMATCH)

## 2016-07-16 LAB — APTT: aPTT: 27 seconds (ref 24–36)

## 2016-07-16 LAB — HEPARIN LEVEL (UNFRACTIONATED): HEPARIN UNFRACTIONATED: 0.6 [IU]/mL (ref 0.30–0.70)

## 2016-07-16 MED ORDER — PRENATAL MULTIVITAMIN CH
1.0000 | ORAL_TABLET | Freq: Every day | ORAL | Status: DC
  Administered 2016-07-19: 1 via ORAL
  Filled 2016-07-16: qty 1

## 2016-07-16 MED ORDER — SODIUM CHLORIDE 0.9 % IV SOLN
10.0000 mL/h | Freq: Once | INTRAVENOUS | Status: AC
  Administered 2016-07-16: 10 mL/h via INTRAVENOUS

## 2016-07-16 MED ORDER — SODIUM CHLORIDE 0.9 % IV BOLUS (SEPSIS)
1000.0000 mL | Freq: Once | INTRAVENOUS | Status: AC
  Administered 2016-07-16: 1000 mL via INTRAVENOUS

## 2016-07-16 MED ORDER — ACETAMINOPHEN 325 MG PO TABS
650.0000 mg | ORAL_TABLET | ORAL | Status: DC | PRN
  Administered 2016-07-18 – 2016-07-20 (×5): 650 mg via ORAL
  Filled 2016-07-16 (×4): qty 2

## 2016-07-16 MED ORDER — SODIUM CHLORIDE 0.9 % IV SOLN: Freq: Once | INTRAVENOUS | Status: DC

## 2016-07-16 MED ORDER — TRANEXAMIC ACID 650 MG PO TABS
1300.0000 mg | ORAL_TABLET | Freq: Three times a day (TID) | ORAL | Status: DC
  Administered 2016-07-16 – 2016-07-19 (×8): 1300 mg via ORAL
  Filled 2016-07-16 (×14): qty 2

## 2016-07-16 MED ORDER — DIPHENHYDRAMINE HCL 25 MG PO CAPS
25.0000 mg | ORAL_CAPSULE | Freq: Once | ORAL | Status: AC
  Administered 2016-07-16: 25 mg via ORAL
  Filled 2016-07-16: qty 1

## 2016-07-16 MED ORDER — PROTHROMBIN COMPLEX CONC HUMAN 500 UNITS IV KIT
4500.0000 [IU] | PACK | Status: AC
  Administered 2016-07-16: 4500 [IU] via INTRAVENOUS
  Filled 2016-07-16: qty 180

## 2016-07-16 MED ORDER — LISINOPRIL-HYDROCHLOROTHIAZIDE 10-12.5 MG PO TABS: 1.0000 | ORAL_TABLET | Freq: Every day | ORAL | Status: DC

## 2016-07-16 MED ORDER — HYDROCHLOROTHIAZIDE 12.5 MG PO CAPS
12.5000 mg | ORAL_CAPSULE | Freq: Every day | ORAL | Status: DC
  Administered 2016-07-16 – 2016-07-19 (×2): 12.5 mg via ORAL
  Filled 2016-07-16 (×5): qty 1

## 2016-07-16 MED ORDER — FERROUS GLUCONATE 324 (38 FE) MG PO TABS
324.0000 mg | ORAL_TABLET | Freq: Every day | ORAL | Status: DC
  Administered 2016-07-16 – 2016-07-20 (×4): 324 mg via ORAL
  Filled 2016-07-16 (×5): qty 1

## 2016-07-16 MED ORDER — MEDROXYPROGESTERONE ACETATE 150 MG/ML IM SUSP
150.0000 mg | Freq: Once | INTRAMUSCULAR | Status: AC
  Administered 2016-07-16: 150 mg via INTRAMUSCULAR
  Filled 2016-07-16: qty 1

## 2016-07-16 MED ORDER — LISINOPRIL 10 MG PO TABS
10.0000 mg | ORAL_TABLET | Freq: Every day | ORAL | Status: DC
  Administered 2016-07-16: 10 mg via ORAL
  Filled 2016-07-16 (×3): qty 1

## 2016-07-16 MED ORDER — OXYCODONE-ACETAMINOPHEN 5-325 MG PO TABS
1.0000 | ORAL_TABLET | ORAL | Status: DC | PRN
  Administered 2016-07-16: 2 via ORAL
  Administered 2016-07-16 – 2016-07-17 (×2): 1 via ORAL
  Administered 2016-07-17: 2 via ORAL
  Filled 2016-07-16: qty 1
  Filled 2016-07-16: qty 2
  Filled 2016-07-16: qty 1
  Filled 2016-07-16: qty 2

## 2016-07-16 MED ORDER — LACTATED RINGERS IV SOLN
INTRAVENOUS | Status: DC
  Administered 2016-07-18 – 2016-07-19 (×2): via INTRAVENOUS

## 2016-07-16 NOTE — ED Notes (Addendum)
Report received from [place] resting in bed and VS stable

## 2016-07-16 NOTE — ED Triage Notes (Addendum)
Pt in via POV, reports feeling light headed x a couple of days, with an episode yesterday of tunnel vision and near syncope.  Today, reports episode of blurred vision to right eye.  Pt reports hx of anemia and uterine fibroids, states she is currently on day 8 of a heavy cycle, passing clots.  Pt is pale, tachycardic upon arrival.  Pt is on eliquis.

## 2016-07-16 NOTE — ED Notes (Signed)
Per EDP we are to give one unit of emergency release blood. Consents completed and verified and Emergency release blood started. Unit number [id number] Product code: [id number]. Blood verified with Jana Half, RN.

## 2016-07-16 NOTE — ED Notes (Signed)
Pt tolerating blood well. Rate increased from 120 to 220

## 2016-07-16 NOTE — Progress Notes (Signed)
1st unit blood infused.  Unable to complete stopped time d/t documentation of starting blood.  (First unit was Emergent Release)  Pt tolerated well no s/s of reactions

## 2016-07-16 NOTE — ED Provider Notes (Addendum)
[facility] Emergency Department Provider Note  ____________________________________________  Time seen: Approximately 2:27 PM  I have reviewed the triage vital signs and the nursing notes.   HISTORY  Chief Complaint Near Syncope   HPI Kaitlin Brown is a 51 y.o. female with a history of menorrhagia, uterine polyps, anemia due to blood loss, hypertension, PE/DVT on Eliquis who presents for evaluation of having vaginal bleeding and near syncopal episode. Patient reports that she has had this menstrual period for 8 days. She is bleeding heavily and changing pads more than once an hour. She is passing large clots. She usually has once a month periods and usually bleeds for 1 week but stops passing clots after day 2. She has uterine fibroids and is scheduled to see obgyn at [facility] in one week to discuss surgical management. She comes in today complaining of feeling fatigued, DOE, and near syncopal episodes. She last took Eliquis 24 hours ago. She denies chest pain, abdominal pain. 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 She has no history of clotting disorders as far she knows.   Past Medical History:  Diagnosis Date  . Anemia due to blood loss, chronic    due to menorrhagia  . Back pain   . Bladder infection   . Chicken pox   . Fibroid uterus   . Hypertension     Patient Active Problem List   Diagnosis Date Noted  . Anemia due to chronic blood loss 07/16/2016  . Acute pulmonary embolism ([place]) 03/17/2016  . DVT (deep venous thrombosis) ([place]) 03/17/2016  . Fibroid uterus   . Anemia due to blood loss, chronic   . Symptomatic anemia 09/28/2015  . Uterine mass 09/28/2015    Past Surgical History:  Procedure Laterality Date  . HYSTEROSCOPY W/D&C  10/2014   [facility], Dr Guadalupe Maple  . none      Prior to Admission medications   Medication Sig Start Date End Date Taking? Authorizing Provider  apixaban (ELIQUIS) 5 MG TABS tablet Take 1  tablet (5 mg total) by mouth 2 (two) times daily. 10mg  BID for 1 week and then 5 mg BID. 03/18/16  Yes Srikar Sudini, MD  ferrous gluconate (FERGON) 324 MG tablet Take 1 tablet (324 mg total) by mouth daily with breakfast. 09/29/15  Yes Lytle Butte, MD  lisinopril-hydrochlorothiazide (PRINZIDE,ZESTORETIC) 10-12.5 MG per tablet Take 1 tablet by mouth daily.   Yes Historical Provider, MD  HYDROcodone-acetaminophen (NORCO) 5-325 MG tablet Take 1 tablet by mouth every 6 (six) hours as needed for severe pain. Patient not taking: Reported on 07/16/2016 03/18/16   Hillary Bow, MD  medroxyPROGESTERone (PROVERA) 10 MG tablet Take 1 tablet (10 mg total) by mouth daily. 10 mg TID for 5 days and then 10 mg Daily Patient not taking: Reported on 07/16/2016 03/18/16   Hillary Bow, MD    Allergies Patient has no known allergies.  Family History  Problem Relation Age of Onset  . Hypertension Mother   . Diabetes Mother   . Glaucoma Mother   . Heart disease Father     Congestive heart condition  . Clotting disorder Maternal Grandmother     Social History Social History  Substance Use Topics  . Smoking status: Never Smoker  . Smokeless tobacco: Never Used  . Alcohol use Yes     Comment: occasionally    Review of Systems  Constitutional: Negative for fever. + near syncope and fatigue Eyes: Negative for visual changes. ENT:  Negative for sore throat. Neck: No neck pain  Cardiovascular: Negative for chest pain. Respiratory: + DOE Gastrointestinal: Negative for abdominal pain, vomiting or diarrhea. Genitourinary: Negative for dysuria. + vaginal bleeding Musculoskeletal: Negative for back pain. Skin: Negative for rash. Neurological: Negative for headaches, weakness or numbness. Psych: No SI or HI  ____________________________________________   PHYSICAL EXAM:  VITAL SIGNS: ED Triage Vitals  Enc Vitals Group     BP 07/16/16 1331 135/68     Pulse Rate 07/16/16 1331 (!) 124     Resp 07/16/16  1331 20     Temp 07/16/16 1331 98.7 F (37.1 C)     Temp Source 07/16/16 1331 Oral     SpO2 07/16/16 1331 100 %     Weight 07/16/16 1318 190 lb (86.2 kg)     Height 07/16/16 1318 5\' 6"  (1.676 m)     Head Circumference --      Peak Flow --      Pain Score --      Pain Loc --      Pain Edu? --      Excl. in [place]? --     Constitutional: Alert and oriented. Well appearing and in no apparent distress. HEENT:      Head: Normocephalic and atraumatic.         Eyes: Conjunctivae are normal. Sclera is non-icteric. EOMI. PERRL      Mouth/Throat: Mucous membranes are moist.       Neck: Supple with no signs of meningismus. Cardiovascular: Tachycardic with regular rhythm. No murmurs, gallops, or rubs. 2+ symmetrical distal pulses are present in all extremities. No JVD. Respiratory: Normal respiratory effort. Lungs are clear to auscultation bilaterally. No wheezes, crackles, or rhonchi.  Gastrointestinal: Soft, non tender, and non distended with positive bowel sounds. No rebound or guarding. Musculoskeletal: Nontender with normal range of motion in all extremities. No edema, cyanosis, or erythema of extremities. Neurologic: Normal speech and language. Face is symmetric. Moving all extremities. No gross focal neurologic deficits are appreciated. Skin: Skin is warm, dry and intact. No rash noted. Psychiatric: Mood and affect are normal. Speech and behavior are normal.  ____________________________________________   LABS (all labs ordered are listed, but only abnormal results are displayed)  Labs Reviewed  BASIC METABOLIC PANEL - Abnormal; Notable for the following:       Result Value   Potassium 3.0 (*)    Glucose, Bld 119 (*)    Calcium 8.2 (*)    All other components within normal limits  CBC - Abnormal; Notable for the following:    RBC 2.68 (*)    Hemoglobin 5.3 (*)    HCT 18.6 (*)    MCV 69.2 (*)    MCH 19.7 (*)    MCHC 28.5 (*)    RDW 18.1 (*)    All other components within normal  limits  APTT  PROTIME-INR  HEPARIN LEVEL (UNFRACTIONATED)  PREPARE RBC (CROSSMATCH)  TYPE AND SCREEN  PREPARE RBC (CROSSMATCH)   ____________________________________________  EKG  ED ECG REPORT I, Rudene Re, the attending physician, personally viewed and interpreted this ECG.  Sinus tachycardia, rate of 119, normal intervals, normal axis, low voltage QRS, no ST elevations or depressions. Unchanged from prior from March 2008 ____________________________________________  RADIOLOGY  none  ____________________________________________   PROCEDURES  Procedure(s) performed: None Procedures Critical Care performed:  Yes  CRITICAL CARE Performed by: Rudene Re  ?  Total critical care time: 35 min  Critical care time was exclusive of  separately billable procedures and treating other patients.  Critical care was necessary to treat or prevent imminent or life-threatening deterioration.  Critical care was time spent personally by me on the following activities: development of treatment plan with patient and/or surrogate as well as nursing, discussions with consultants, evaluation of patient's response to treatment, examination of patient, obtaining history from patient or surrogate, ordering and performing treatments and interventions, ordering and review of laboratory studies, ordering and review of radiographic studies, pulse oximetry and re-evaluation of patient's condition.  ____________________________________________   INITIAL IMPRESSION / ASSESSMENT AND PLAN / ED COURSE  51 y.o. female with a history of menorrhagia, uterine polyps, anemia due to blood loss, hypertension, PE/DVT on Eliquis who presents for evaluation of having vaginal bleeding and near syncopal episode. Patient is normotensive but tachycardic with heart rate of 124, she is pale, her hemoglobin is 5.3. I discussed risks and benefits of reversing Eliquis with Kcentra to stop the bleeding so she  doesn't go into hemorrhagic shock. But also explained to her reversing blood thinners can increase the chance of developing blood clots which can present as pulmonary embolism which can also cause significant morbidity and mortality. Patient is in agreement to receive Kcentra. Will give 1 unit of emergent blood. Type screen and lab asked to prepare 2 more units as needed. Discussed with Obgyn Dr. Kenton Kingfisher who will admit patient.   Clinical Course as of Jul 17 1535  Wed Jul 16, 2016  [place] Spoke with Dr. Lucky Cowboy, vascular surgeon on call about possibility of placing IVC filter in this patient since she needs to be off of Eliquis for menorrhagia and acute blood loss anemia. He will follow patient in house and discuss plan with Dr. Kenton Kingfisher OBGYN who is admitting the patient.   [CV]    Clinical Course User Index [CV] Rudene Re, MD    Pertinent labs & imaging results that were available during my care of the patient were reviewed by me and considered in my medical decision making (see chart for details).    ____________________________________________   FINAL CLINICAL IMPRESSION(S) / ED DIAGNOSES  Final diagnoses:  Acute blood loss anemia  Menorrhagia with regular cycle      NEW MEDICATIONS STARTED DURING THIS VISIT:  New Prescriptions   No medications on file     Note:  This document was prepared using Dragon voice recognition software and may include unintentional dictation errors.    Rudene Re, MD 07/16/16 1442    Rudene Re, MD 07/16/16 [place], MD 07/16/16 1537

## 2016-07-16 NOTE — Progress Notes (Signed)
ANTICOAGULATION CONSULT NOTE - Initial Consult  Pharmacy Consult for [facility] Monitoring Indication: Bleed reversal - taking apixaban prior to admission  No Known Allergies  Patient Measurements: Height: 5\' 6"  (167.6 cm) Weight: 190 lb (86.2 kg) IBW/kg (Calculated) : 59.3  Vital Signs: Temp: 97.8 F (36.6 C) (05/02 1510) Temp Source: Oral (05/02 1510) BP: 125/72 (05/02 1510) Pulse Rate: 96 (05/02 1510)  Labs:  Recent Labs  07/16/16 1335 07/16/16 1420  HGB 5.3*  --   HCT 18.6*  --   PLT 303  --   APTT 27  --   LABPROT 13.0  --   INR 0.98  --   HEPARINUNFRC  --  0.60  CREATININE 0.71  --    Estimated Creatinine Clearance: 95.2 mL/min (by C-G formula based on SCr of 0.71 mg/dL).  Medical History: Past Medical History:  Diagnosis Date  . Anemia due to blood loss, chronic    due to menorrhagia  . Back pain   . Bladder infection   . Chicken pox   . Fibroid uterus   . Hypertension    Assessment: KCentra 50 units/kg was ordered by physician in the ED for reversal of bleeding. Hgb 5.3 on admission, patient tachycardic and pale per notes.   Wt = 86 kg Baseline labs ordered   Plan:  Confirmed with RN that medication was delivered to her. Post-infusion INR monitoring not indication as patient was not taking warfarin prior to admission.  Will order CBC with AM labs tomorrow  Lenis Noon, PharmD Clinical Pharmacist 07/16/2016,3:19 PM

## 2016-07-16 NOTE — H&P (Signed)
Obstetrics & Gynecology History and Physical Note  Date of Consultation: 07/16/2016   Requesting Provider: [facility]  Primary OBGYN: None; has been seen at [facility] in recent past Primary Care Provider: [place]  Reason for Consultation: Anemia, Menorrhagia, Fibroids  History of Present Illness: Ms. Carlson is a 51 y.o. G2P0020 (Patient's last menstrual period was 07/09/2016 (approximate).), with the above CC. Pt has 10 day h/o heavy bleeding and 1 day h/o dizziness and spotty vision/changes and feeling weak.  Prior known history of large fibroids for years; has had [place], CT, MRI, and D&C hysteroscopy in past; has been recommended for hysterectomy but has deferred to do this so far.  Had DVT in Jan 2018, on Eliquis.    ROS: A review of systems was performed and negative, except as stated in the above HPI.  OBGYN History: As per HPI. OB History    Gravida Para Term Preterm AB Living   2 0 0 0 2 0   SAB TAB Ectopic Multiple Live Births   2               Past Medical History: Past Medical History:  Diagnosis Date  . Anemia due to blood loss, chronic    due to menorrhagia  . Back pain   . Bladder infection   . Chicken pox   . Fibroid uterus   . Hypertension     Past Surgical History: Past Surgical History:  Procedure Laterality Date  . HYSTEROSCOPY W/D&C  10/2014   [facility], Dr Guadalupe Maple  . none      Family History:  Family History  Problem Relation Age of Onset  . Hypertension Mother   . Diabetes Mother   . Glaucoma Mother   . Heart disease Father     Congestive heart condition  . Clotting disorder Maternal Grandmother    She denies any female cancers, bleeding or blood clotting disorders.   Social History:  Social History   Social History  . Marital status: Single    Spouse name: N/A  . Number of children: N/A  . Years of education: N/A   Occupational History  . Not on file.   Social History Main Topics  . Smoking status: Never Smoker  . Smokeless  tobacco: Never Used  . Alcohol use Yes     Comment: occasionally  . Drug use: No  . Sexual activity: Yes    Birth control/ protection: None   Other Topics Concern  . Not on file   Social History Narrative  . No narrative on file    Allergy: No Known Allergies  Current Outpatient Medications:  (Not in a hospital admission)  Hospital Medications: Current Facility-Administered Medications  Medication Dose Route Frequency Provider Last Rate Last Dose  . 0.9 %  sodium chloride infusion   Intravenous Once Gae Dry, MD      . acetaminophen (TYLENOL) tablet 650 mg  650 mg Oral Q4H PRN Gae Dry, MD      . diphenhydrAMINE (BENADRYL) capsule 25 mg  25 mg Oral Once Gae Dry, MD      . Derrill Memo ON 07/17/2016] ferrous gluconate (FERGON) tablet 324 mg  324 mg Oral Q breakfast Gae Dry, MD      . lactated ringers infusion   Intravenous Continuous Gae Dry, MD      . lisinopril-hydrochlorothiazide (PRINZIDE,ZESTORETIC) 10-12.5 MG per tablet 1 tablet  1 tablet Oral Daily Gae Dry, MD      .  medroxyPROGESTERone (DEPO-PROVERA) injection 150 mg  150 mg Intramuscular Once Gae Dry, MD      . oxyCODONE-acetaminophen (PERCOCET/ROXICET) 5-325 MG per tablet 1-2 tablet  1-2 tablet Oral Q3H PRN Gae Dry, MD      . Derrill Memo ON 07/17/2016] prenatal multivitamin tablet 1 tablet  1 tablet Oral Q1200 Gae Dry, MD      . prothrombin complex conc human (KCENTRA) IVPB 4,500 Units  4,500 Units Intravenous STAT Rudene Re, MD      . tranexamic acid (LYSTEDA) tablet 1,300 mg  1,300 mg Oral TID Gae Dry, MD       Current Outpatient Prescriptions  Medication Sig Dispense Refill  . apixaban (ELIQUIS) 5 MG TABS tablet Take 1 tablet (5 mg total) by mouth 2 (two) times daily. 10mg  BID for 1 week and then 5 mg BID. 70 tablet 0  . ferrous gluconate (FERGON) 324 MG tablet Take 1 tablet (324 mg total) by mouth daily with breakfast. 30 tablet 3  .  lisinopril-hydrochlorothiazide (PRINZIDE,ZESTORETIC) 10-12.5 MG per tablet Take 1 tablet by mouth daily.    Marland Kitchen HYDROcodone-acetaminophen (NORCO) 5-325 MG tablet Take 1 tablet by mouth every 6 (six) hours as needed for severe pain. (Patient not taking: Reported on 07/16/2016) 15 tablet 0  . medroxyPROGESTERone (PROVERA) 10 MG tablet Take 1 tablet (10 mg total) by mouth daily. 10 mg TID for 5 days and then 10 mg Daily (Patient not taking: Reported on 07/16/2016) 40 tablet 0    Physical Exam: Temp:  [97.9 F (36.6 C)-98.7 F (37.1 C)] 97.9 F (36.6 C) (05/02 1446) Pulse Rate:  [95-124] 95 (05/02 1446) Resp:  [20] 20 (05/02 1446) BP: (128-135)/(68-70) 128/70 (05/02 1446) SpO2:  [100 %] 100 % (05/02 1446) Weight:  [190 lb (86.2 kg)] 190 lb (86.2 kg) (05/02 1318) Body mass index is 30.67 kg/m. Constitutional: Well nourished, well developed female in no acute distress.  HEENT: normal Neck:  Supple, normal appearance, and no thyromegaly  Cardiovascular:Regular rate and rhythm.  No murmurs, rubs or gallops. Respiratory:  Clear to auscultation bilateral. Normal respiratory effort Abdomen: positive bowel sounds and no masses, hernias; diffusely non tender to palpation, uterus mass felt up above umb towards rib line Neuro: grossly intact Psych:  Normal mood and affect.  Skin:  Warm and dry.  MS: normal gait and normal bilateral lower extremity strength/ROM/symmetry Lymphatic:  No inguinal lymphadenopathy.   Recent Labs Lab 07/16/16 1335  WBC 4.9  HGB 5.3*  HCT 18.6*  PLT 303    Recent Labs Lab 07/16/16 1335  NA 139  K 3.0*  CL 106  CO2 25  BUN 6  CREATININE 0.71  CALCIUM 8.2*  GLUCOSE 119*    Recent Labs Lab 07/16/16 1335  APTT 27  INR 0.98   No results for input(s): ABORH in the last 168 hours.  Imaging:  Prior imaging reviewed.  CT 2017 - Reproductive: Marked enlargement of the uterus. Heterogeneous mass at upper to mid uterine segment measuring 14.4 x 10.8 x 14.4  cm either representing marked interval enlargement of a uterine mass since 2015 versus hemorrhage into a pre-existing uterine lesion. Additional smaller leiomyoma 3.1 cm diameter LEFT lateral uterus. Adnexa unremarkable. Other: Small amount of free pelvic fluid, low-attenuation. Small umbilical hernia containing fat. No free intraperitoneal air. Musculoskeletal: Osseous structures unremarkable. IMPRESSION:  Marked enlargement of the uterus by a large mass at the upper to mid uterine segments, 14.4 x 14.4 x 10.8 cm in size with swirled  heterogeneous internal attenuation either representing significant enlargement of a uterine mass such as leiomyoma or uterine sarcoma since the prior ultrasound from 2015 versus hemorrhage into a pre-existing uterine lesion. Additional small LEFT lateral uterine leiomyoma.  [place] 2017-Uterus is enlarged and overtaken by a large intrauterine mass measuring 14.4 x 14.4 x 10.8 cm. The echotexture demonstrates a striped acoustic shadowing pattern consistent with a uterine fibroid. The endometrial echo is not well visualized and appears distorted by the uterine fibroids.  Assessment: Ms. Griebel is a 51 y.o. G2P0020 (Patient's last menstrual period was 07/09/2016 (approximate).) who presented to the ED with complaints of fatigue, dizziness, visual changes; findings are consistent with anemia with current bleeding, fibroids, and h/o DVT.  Fibroids are obvious and large, no additional imaging needed for acute care.  Plan: Blood transfusion as anemia is most critical component of her care right now. Lysteda and Depo Procera to try to slow down bleeding.  Would prefer to stay away from estrogen meds due to DVT history. ER physician has recommended stopping the Eliquis, with counseling as to risks of DVT compared to the risks of bleeding and anemia.  SCDs.  Consider vasc consult/filter. Needs to follow up for fibroid treatment once stable.  Recommend at tertiary care  center where her care originated as well as due to high risk due to size of fibroids and h/o DVT.  Has appt already scheduled soon (07/31/16 [facility]).  Barnett Applebaum, MD, [facility], [facility] 07/16/2016  3:07 PM Pager [phone]

## 2016-07-16 NOTE — Consult Note (Signed)
[facility] Vascular Consult Note  MRN : [id number]  Kaitlin Brown is a 51 y.o. (October 04, 1965) female who presents with chief complaint of  Chief Complaint  Patient presents with  . Near Syncope  .  History of Present Illness: I am asked to see the patient by Dr. Kenton Kingfisher regarding a patient with symptomatic anemia from a very large uterine fibroid with a previous DVT and pulmonary embolus. Approximately 4-5 months ago the patient was diagnosed with DVT and pulmonary embolus. She has been on anticoagulation since that time and has done reasonably well with it until recently. She has had problem with heavy menses 4 years secondary to very large uterine fibroids. Apparently, she was offered hysterectomy in the past but has declined this option. She is being admitted for symptomatic anemia with tachycardia and low blood pressure resultant from her heavy uterine bleeding. There are no other obvious areas of bleeding. She is not having hematuria or GI bleeding. She has had no trauma or injury. She says she had an ultrasound on her leg a month or 2 ago and they did not see any clots. She has no current chest pain or shortness of breath. She denies any current lower extremity symptoms  Current Facility-Administered Medications  Medication Dose Route Frequency Provider Last Rate Last Dose  . 0.9 %  sodium chloride infusion   Intravenous Once Gae Dry, MD      . acetaminophen (TYLENOL) tablet 650 mg  650 mg Oral Q4H PRN Gae Dry, MD      . diphenhydrAMINE (BENADRYL) capsule 25 mg  25 mg Oral Once Gae Dry, MD      . ferrous gluconate [facility]) tablet 324 mg  324 mg Oral Q breakfast Gae Dry, MD   324 mg at 07/16/16 1711  . lisinopril (PRINIVIL,ZESTRIL) tablet 10 mg  10 mg Oral Daily Gae Dry, MD   10 mg at 07/16/16 1711   And  . hydrochlorothiazide (MICROZIDE) capsule 12.5 mg  12.5 mg Oral Daily Gae Dry, MD   12.5 mg at 07/16/16  1711  . lactated ringers infusion   Intravenous Continuous Gae Dry, MD      . oxyCODONE-acetaminophen (PERCOCET/ROXICET) 5-325 MG per tablet 1-2 tablet  1-2 tablet Oral Q3H PRN Gae Dry, MD   1 tablet at 07/16/16 1712  . [START ON 07/17/2016] prenatal multivitamin tablet 1 tablet  1 tablet Oral Q1200 Gae Dry, MD      . tranexamic acid (LYSTEDA) tablet 1,300 mg  1,300 mg Oral TID Gae Dry, MD   1,300 mg at 07/16/16 1712    Past Medical History:  Diagnosis Date  . Anemia due to blood loss, chronic    due to menorrhagia  . Back pain   . Bladder infection   . Chicken pox   . Fibroid uterus   . Hypertension     Past Surgical History:  Procedure Laterality Date  . HYSTEROSCOPY W/D&C  10/2014   [facility], Dr Guadalupe Maple  . none      Social History Social History  Substance Use Topics  . Smoking status: Never Smoker  . Smokeless tobacco: Never Used  . Alcohol use Yes     Comment: occasionally  No IV drug use  Family History Family History  Problem Relation Age of Onset  . Hypertension Mother   . Diabetes Mother   . Glaucoma Mother   . Heart disease Father  Congestive heart condition  . Clotting disorder Maternal Grandmother     No Known Allergies   REVIEW OF SYSTEMS (Negative unless checked)  Constitutional: [] Weight loss  [] Fever  [] Chills Cardiac: [] Chest pain   [] Chest pressure   [] Palpitations   [] Shortness of breath when laying flat   [] Shortness of breath at rest   [] Shortness of breath with exertion. Vascular:  [] Pain in legs with walking   [] Pain in legs at rest   [] Pain in legs when laying flat   [] Claudication   [] Pain in feet when walking  [] Pain in feet at rest  [] Pain in feet when laying flat   [x] History of DVT   [] Phlebitis   [] Swelling in legs   [] Varicose veins   [] Non-healing ulcers Pulmonary:   [] Uses home oxygen   [] Productive cough   [] Hemoptysis   [] Wheeze  [] COPD   [] Asthma Neurologic:  [] Dizziness  [] Blackouts   [] Seizures    [] History of stroke   [] History of TIA  [] Aphasia   [] Temporary blindness   [] Dysphagia   [] Weakness or numbness in arms   [] Weakness or numbness in legs Musculoskeletal:  [] Arthritis   [] Joint swelling   [] Joint pain   [] Low back pain Hematologic:  [] Easy bruising  [] Easy bleeding   [] Hypercoagulable state   [x] Anemic  [] Hepatitis Gastrointestinal:  [] Blood in stool   [] Vomiting blood  [] Gastroesophageal reflux/heartburn   [] Difficulty swallowing. Genitourinary:  [] Chronic kidney disease   [] Difficult urination  [] Frequent urination  [] Burning with urination   [] Blood in urine Skin:  [] Rashes   [] Ulcers   [] Wounds Psychological:  [] History of anxiety   []  History of major depression.  Physical Examination  Vitals:   07/16/16 1510 07/16/16 1530 07/16/16 1623 07/16/16 1704  BP: 125/72 123/70 126/71 126/76  Pulse: 96 89 90 92  Resp: 12 (!) 21 16 16   Temp: 97.8 F (36.6 C)  98.4 F (36.9 C) 97.8 F (36.6 C)  TempSrc: Oral  Oral Oral  SpO2: 100% 100% 100% 100%  Weight:      Height:       Body mass index is 30.67 kg/m. Gen:  WD/WN, NAD Head: [place]/AT, No temporalis wasting.  Ear/Nose/Throat: Hearing grossly intact, nares w/o erythema or drainage, oropharynx w/o Erythema/Exudate Eyes: Sclera non-icteric, conjunctiva clear Neck: Trachea midline.  No JVD.  Pulmonary:  Good air movement, respirations not labored, equal bilaterally.  Cardiac: RRR, normal S1, S2. Vascular:  Vessel Right Left  Radial Palpable Palpable                                   Gastrointestinal: soft, non-tender/non-distended. No guarding/reflex.  Musculoskeletal: M/S 5/5 throughout.  Extremities without ischemic changes.  No deformity or atrophy. Neurologic: Sensation grossly intact in extremities.  Symmetrical.  Speech is fluent. Motor exam as listed above. Psychiatric: Judgment intact, Mood & affect appropriate for pt's clinical situation. Dermatologic: No rashes or ulcers noted.  No cellulitis or open  wounds.       CBC Lab Results  Component Value Date   WBC 4.9 07/16/2016   HGB 5.3 (L) 07/16/2016   HCT 18.6 (L) 07/16/2016   MCV 69.2 (L) 07/16/2016   PLT 303 07/16/2016    BMET    Component Value Date/Time   NA 139 07/16/2016 1335   NA 141 03/23/2014 1946   K 3.0 (L) 07/16/2016 1335   K 3.3 (L) 03/23/2014 1946   CL 106 07/16/2016  1335   CL 107 03/23/2014 1946   CO2 25 07/16/2016 1335   CO2 28 03/23/2014 1946   GLUCOSE 119 (H) 07/16/2016 1335   GLUCOSE 64 (L) 03/23/2014 1946   BUN 6 07/16/2016 1335   BUN 8 03/23/2014 1946   CREATININE 0.71 07/16/2016 1335   CREATININE 0.78 03/23/2014 1946   CALCIUM 8.2 (L) 07/16/2016 1335   CALCIUM 8.2 (L) 03/23/2014 1946   GFRNONAA >60 07/16/2016 1335   GFRNONAA >60 03/23/2014 1946   GFRNONAA >60 08/20/2013 1732   GFRAA >60 07/16/2016 1335   GFRAA >60 03/23/2014 1946   GFRAA >60 08/20/2013 1732   Estimated Creatinine Clearance: 95.2 mL/min (by C-G formula based on SCr of 0.71 mg/dL).  COAG Lab Results  Component Value Date   INR 0.98 07/16/2016    Radiology No results found.    Assessment/Plan 1. DVT and pulmonary embolus approximate 4-5 months ago treated successfully with anticoagulation. I would repeat a lower extremity duplex today and if she does not have active thrombosis currently, I would not place an IVC filter at this time. Although she has not read duration of treatment, she has reached a reasonable time and the risk of embolization in the short-term is reasonably small duplex of the lower extremities will be ordered and we will follow up with the results of this. 2. Very large uterine fibroids with heavy uterine bleeding creating anemia which is symptomatic. I discussed with the patient that uterine artery embolization would be a reasonably good option to decrease the flow to the uterus and I suspect markedly improve her heavy uterine bleeding. This may prevent her from having to have a hysterectomy and I would  expect at least delayed this for some time and maybe until she reaches menopause when these will shrink on the road. She is not sure that she wants to have the uterine artery embolization would like to think about it. I can perform this tomorrow but I could not perform Friday as I will being clinic. This could be done as an outpatient as well and does not have to be done immediately. 3. Symptomatic anemia. Secondary to #2. Getting blood and volume transfusion now. 4. Hypertension. Stable on outpatient medications and currently blood pressure is reasonably low from her anemia.  Leotis Pain, MD  07/16/2016 5:17 PM    This note was created with Dragon medical transcription system.  Any error is purely unintentional

## 2016-07-17 ENCOUNTER — Inpatient Hospital Stay: Payer: Self-pay

## 2016-07-17 DIAGNOSIS — D649 Anemia, unspecified: Secondary | ICD-10-CM | POA: Diagnosis present

## 2016-07-17 DIAGNOSIS — N938 Other specified abnormal uterine and vaginal bleeding: Secondary | ICD-10-CM

## 2016-07-17 DIAGNOSIS — I10 Essential (primary) hypertension: Secondary | ICD-10-CM

## 2016-07-17 DIAGNOSIS — R000 Tachycardia, unspecified: Secondary | ICD-10-CM

## 2016-07-17 LAB — TYPE AND SCREEN
ABO/RH(D): A NEG
Antibody Screen: NEGATIVE
UNIT DIVISION: 0
Unit division: 0

## 2016-07-17 LAB — CBC
HCT: 23.3 % — ABNORMAL LOW (ref 35.0–47.0)
HEMATOCRIT: 21.1 % — AB (ref 35.0–47.0)
Hemoglobin: 6.3 g/dL — ABNORMAL LOW (ref 12.0–16.0)
Hemoglobin: 7.2 g/dL — ABNORMAL LOW (ref 12.0–16.0)
MCH: 22 pg — ABNORMAL LOW (ref 26.0–34.0)
MCH: 22.8 pg — AB (ref 26.0–34.0)
MCHC: 30.1 g/dL — AB (ref 32.0–36.0)
MCHC: 30.8 g/dL — AB (ref 32.0–36.0)
MCV: 73.1 fL — AB (ref 80.0–100.0)
MCV: 74.2 fL — ABNORMAL LOW (ref 80.0–100.0)
PLATELETS: 282 10*3/uL (ref 150–440)
Platelets: 252 10*3/uL (ref 150–440)
RBC: 2.89 MIL/uL — ABNORMAL LOW (ref 3.80–5.20)
RBC: 3.14 MIL/uL — AB (ref 3.80–5.20)
RDW: 19.8 % — AB (ref 11.5–14.5)
RDW: 20.7 % — ABNORMAL HIGH (ref 11.5–14.5)
WBC: 5.3 10*3/uL (ref 3.6–11.0)
WBC: 6.4 10*3/uL (ref 3.6–11.0)

## 2016-07-17 LAB — BPAM RBC
BLOOD PRODUCT EXPIRATION DATE: 201805122359
BLOOD PRODUCT EXPIRATION DATE: 201805132359
ISSUE DATE / TIME: 201805021450
ISSUE DATE / TIME: 201805022140
UNIT TYPE AND RH: 9500
Unit Type and Rh: 9500

## 2016-07-17 MED ORDER — SODIUM CHLORIDE 0.9 % IV SOLN
INTRAVENOUS | Status: DC
  Administered 2016-07-17: 22:00:00 via INTRAVENOUS

## 2016-07-17 NOTE — Progress Notes (Signed)
Patient feeling tired. Reported feeling dizzy earlier. Discussed with Dr. Lucky Cowboy of vascular surgery a uterine artery embolization procedure. She wishes to proceed.  I had a long discussion with the patient regarding management of her fibroids.  She has elected to proceed with [place].  I discussed with her that while the probability is low that she has a leiomyosarcoma, the possibility is not zero.  My one concern with [place] would be a potential delay in hysterectomy and treatment if there happens to be a leiomyosarcoma.  I recommended to her that she should keep her next appointment with [facility] on 5/17 (per Dr Kenton Kingfisher) and discuss this with them.  We have previously told the patient that we would not perform her hysterectomy due to her comorbid conditions and that [facility] would be the best place for her to get a definitive treatment to this issue.  The sole purpose of our discussion was that I did not want her to delay follow up on this issue, if she goes forward with the [place].  This is mainly due to the fact that if a cancer is present, her not following up might delay treatment. She may feel better after her [place] and she may benefit significantly.  But, I strongly recommended her to follow up at [facility] to verify this is the best option for her and whether she should proceed with hysterectomy.  She voiced understanding and appreciation for the discussion.  Will redraw a CBC to make sure her blood counts are stable for surgery tomorrow.  Prentice Docker, MD 07/17/2016 7:43 PM

## 2016-07-17 NOTE — Progress Notes (Signed)
Benign Gynecology Progress Note  Admission Date: 07/16/2016 Current Date: 07/17/2016  Kaitlin Brown is a 51 y.o. G2P0020 HD#1 with anemia and bleeding from fibroids.  History complicated by: Patient Active Problem List   Diagnosis Date Noted  . Anemia due to chronic blood loss 07/16/2016  . Acute pulmonary embolism ([place]) 03/17/2016  . DVT (deep venous thrombosis) ([place]) 03/17/2016  . Fibroid uterus   . Anemia due to blood loss, chronic   . Symptomatic anemia 09/28/2015  . Uterine mass 09/28/2015   ROS and patient/family/surgical history, located on admission H&P note dated 07/16/2016, have been reviewed, and there are no changes except as noted below  Yesterday/Overnight Events:  Bleeding less today, min pain.  Subjective:  Doing well.  Counseled about options for treatment.  Objective:   Temp:  [97.8 F (36.6 C)-98.7 F (37.1 C)] 98.4 F (36.9 C) (05/03 0351) Pulse Rate:  [82-124] 86 (05/03 0352) Resp:  [12-21] 18 (05/03 0351) BP: (99-135)/(45-76) 102/52 (05/03 0352) SpO2:  [98 %-100 %] 99 % (05/03 0352) Weight:  [190 lb (86.2 kg)] 190 lb (86.2 kg) (05/02 1318) I/O last 3 completed shifts: In: 660 [I.V.:50; Blood:610] Out: -  No intake/output data recorded.  Intake/Output Summary (Last 24 hours) at 07/17/16 0753 Last data filed at 07/17/16 0017  Gross per 24 hour  Intake              660 ml  Output                0 ml  Net              660 ml     Current Vital Signs 24h Vital Sign Ranges  T 98.4 F (36.9 C) Temp  Avg: 98.2 F (36.8 C)  Min: 97.8 F (36.6 C)  Max: 98.7 F (37.1 C)  BP (!) 102/52 BP  Min: 99/45  Max: 135/68  HR 86 Pulse  Avg: 93.5  Min: 82  Max: 124  RR 18 Resp  Avg: 17.5  Min: 12  Max: 21  SaO2 99 % Not Delivered SpO2  Avg: 99.8 %  Min: 98 %  Max: 100 %           24 Hour I/O Current Shift I/O  Time Ins Outs 05/02 0701 - 05/03 0700 In: 660 [I.V.:50] Out: -  No intake/output data recorded.   Physical exam: General appearance:  alert, cooperative and appears stated age Abdomen: fibroids up to rib cage by exam. soft, non-tender; bowel sounds normal; no masses, no organomegaly GU: No gross VB Lungs: clear to auscultation bilaterally Heart: regular rate and rhythm and no MRGs Extremities: no redness or tenderness in the calves or thighs, no edema Skin: no lesions Psych: appropriate  Labs:          Recent Labs Lab 07/16/16 1335 07/17/16 0523  WBC 4.9 5.3  HGB 5.3* 6.3*  HCT 18.6* 21.1*  PLT 303 252    Recent Labs Lab 07/16/16 1335  NA 139  K 3.0*  CL 106  CO2 25  BUN 6  CREATININE 0.71  CALCIUM 8.2*  GLUCOSE 119*    Assessment & Plan:  Fibroids and pain and bleeding  *GYN: Options d/w pt/  [place] ve hysterectomy. *Pain: controlled *FEN/GI:reg diet *Dispo:d/w Dr Lucky Cowboy options as well today Plan discharge on iron therapy; keep GYN appt at [facility] on May 17 No further transfusion as long as no symptoms  Barnett Applebaum, MD, [facility], [facility] 07/17/2016  7:56 AM

## 2016-07-17 NOTE — Progress Notes (Signed)
[facility]  Daily Progress Note   Subjective  - * No surgery date entered *  Still feels her heart racing when she does any activity. Still feels lightheaded as well. Hemoglobin was better, but still significantly anemic. She also reports that she is still bleeding although it has slowed as well. No lower extremity symptoms. Venous duplex came back negative for DVT.  Objective Vitals:   07/17/16 0352 07/17/16 0755 07/17/16 1138 07/17/16 1634  BP: (!) 102/52 (!) 107/59 128/63 113/61  Pulse: 86 81 84 83  Resp:  18 18 18   Temp:  97.8 F (36.6 C) 98.3 F (36.8 C) 98.5 F (36.9 C)  TempSrc:  Oral Oral Oral  SpO2: 99% 99% 98% 99%  Weight:      Height:        Intake/Output Summary (Last 24 hours) at 07/17/16 1642 Last data filed at 07/17/16 0900  Gross per 24 hour  Intake             1140 ml  Output                0 ml  Net             1140 ml    PULM  CTAB CV  RRR ABD     soft without tenderness  Laboratory CBC    Component Value Date/Time   WBC 5.3 07/17/2016 0523   HGB 6.3 (L) 07/17/2016 0523   HGB 11.4 (L) 03/23/2014 1946   HCT 21.1 (L) 07/17/2016 0523   HCT 36.1 03/23/2014 1946   PLT 252 07/17/2016 0523   PLT 351 03/23/2014 1946    BMET    Component Value Date/Time   NA 139 07/16/2016 1335   NA 141 03/23/2014 1946   K 3.0 (L) 07/16/2016 1335   K 3.3 (L) 03/23/2014 1946   CL 106 07/16/2016 1335   CL 107 03/23/2014 1946   CO2 25 07/16/2016 1335   CO2 28 03/23/2014 1946   GLUCOSE 119 (H) 07/16/2016 1335   GLUCOSE 64 (L) 03/23/2014 1946   BUN 6 07/16/2016 1335   BUN 8 03/23/2014 1946   CREATININE 0.71 07/16/2016 1335   CREATININE 0.78 03/23/2014 1946   CALCIUM 8.2 (L) 07/16/2016 1335   CALCIUM 8.2 (L) 03/23/2014 1946   GFRNONAA >60 07/16/2016 1335   GFRNONAA >60 03/23/2014 1946   GFRNONAA >60 08/20/2013 1732   GFRAA >60 07/16/2016 1335   GFRAA >60 03/23/2014 1946   GFRAA >60 08/20/2013 1732     Assessment/Planning:    History of DVT and pulmonary embolus about 4-5 months ago. She does not have recurrent DVT and with her profound anemia, I would not resume her anticoagulation at this time. She does not need an IVC filter without an active DVT.  Symptomatic uterine fibroids with profound bleeding and anemia. I have again discussed options including uterine artery embolization with the patient. At this point, with her persistent anemia and symptoms she desires to have that performed. I think that is reasonable option. She may still require hysterectomy at some point, but I think this would make it much easier to do the hysterectomy and much safer. I discussed the case with Dr. Glennon Mac prior to the patient deciding to have this done until him I thought this was still a reasonable option. We will schedule this for tomorrow. Risks and benefits were discussed.    Leotis Pain  07/17/2016, 4:42 PM

## 2016-07-17 NOTE — Progress Notes (Signed)
Patient reporting new complaint of blurry vision and feeling fatigued. Dr. Glennon Mac notified and stated he will place orders and will come see patient.

## 2016-07-18 ENCOUNTER — Encounter
Admission: EM | Disposition: A | Discharge status: Home / Self Care | Payer: Self-pay | Source: Home / Self Care | Attending: Obstetrics & Gynecology

## 2016-07-18 DIAGNOSIS — D259 Leiomyoma of uterus, unspecified: Secondary | ICD-10-CM

## 2016-07-18 HISTORY — PX: EMBOLIZATION (CATH LAB): CATH118239

## 2016-07-18 LAB — CBC
HCT: 23.3 % — ABNORMAL LOW (ref 35.0–47.0)
HEMOGLOBIN: 7.2 g/dL — AB (ref 12.0–16.0)
MCH: 23.1 pg — ABNORMAL LOW (ref 26.0–34.0)
MCHC: 30.8 g/dL — ABNORMAL LOW (ref 32.0–36.0)
MCV: 74.8 fL — ABNORMAL LOW (ref 80.0–100.0)
Platelets: 288 10*3/uL (ref 150–440)
RBC: 3.12 MIL/uL — AB (ref 3.80–5.20)
RDW: 21.2 % — ABNORMAL HIGH (ref 11.5–14.5)
WBC: 7 10*3/uL (ref 3.6–11.0)

## 2016-07-18 LAB — HCG, QUANTITATIVE, PREGNANCY

## 2016-07-18 SURGERY — EMBOLIZATION
Anesthesia: Moderate Sedation | Laterality: Bilateral

## 2016-07-18 MED ORDER — LIDOCAINE-EPINEPHRINE (PF) 2 %-1:200000 IJ SOLN
INTRAMUSCULAR | Status: AC
  Filled 2016-07-18: qty 20

## 2016-07-18 MED ORDER — FENTANYL CITRATE (PF) 100 MCG/2ML IJ SOLN
INTRAMUSCULAR | Status: AC
  Filled 2016-07-18: qty 2

## 2016-07-18 MED ORDER — MORPHINE SULFATE 2 MG/ML IV SOLN
INTRAVENOUS | Status: DC
  Administered 2016-07-18: 6 mg via INTRAVENOUS
  Administered 2016-07-18: 18:00:00 via INTRAVENOUS
  Administered 2016-07-19: 2.5 mL via INTRAVENOUS
  Administered 2016-07-19: 1.5 mg via INTRAVENOUS
  Administered 2016-07-19: 3 mg via INTRAVENOUS
  Administered 2016-07-19: 3.45 mL via INTRAVENOUS
  Filled 2016-07-18: qty 30

## 2016-07-18 MED ORDER — MIDAZOLAM HCL 2 MG/2ML IJ SOLN
INTRAMUSCULAR | Status: AC
  Filled 2016-07-18: qty 2

## 2016-07-18 MED ORDER — HEPARIN (PORCINE) IN NACL 2-0.9 UNIT/ML-% IJ SOLN
INTRAMUSCULAR | Status: AC
  Filled 2016-07-18: qty 1000

## 2016-07-18 MED ORDER — MIDAZOLAM HCL 2 MG/2ML IJ SOLN
INTRAMUSCULAR | Status: DC | PRN
  Administered 2016-07-18 (×3): 1 mg via INTRAVENOUS
  Administered 2016-07-18: 2 mg via INTRAVENOUS

## 2016-07-18 MED ORDER — CEFAZOLIN SODIUM-DEXTROSE 1-4 GM/50ML-% IV SOLN
1.0000 g | Freq: Once | INTRAVENOUS | Status: AC
  Administered 2016-07-18: 1 g via INTRAVENOUS

## 2016-07-18 MED ORDER — MIDAZOLAM HCL 5 MG/5ML IJ SOLN
INTRAMUSCULAR | Status: AC
  Filled 2016-07-18: qty 5

## 2016-07-18 MED ORDER — HYDROMORPHONE HCL 1 MG/ML IJ SOLN
INTRAMUSCULAR | Status: AC
  Filled 2016-07-18: qty 0.5

## 2016-07-18 MED ORDER — ONDANSETRON HCL 4 MG/2ML IJ SOLN
4.0000 mg | Freq: Four times a day (QID) | INTRAMUSCULAR | Status: DC | PRN
  Administered 2016-07-18 – 2016-07-19 (×2): 4 mg via INTRAVENOUS
  Filled 2016-07-18 (×2): qty 2

## 2016-07-18 MED ORDER — HEPARIN SODIUM (PORCINE) 1000 UNIT/ML IJ SOLN
INTRAMUSCULAR | Status: AC
  Filled 2016-07-18: qty 1

## 2016-07-18 MED ORDER — HYDROMORPHONE HCL 1 MG/ML IJ SOLN
INTRAMUSCULAR | Status: AC
  Administered 2016-07-18: 1 mg
  Filled 2016-07-18: qty 0.5

## 2016-07-18 MED ORDER — MORPHINE SULFATE (PF) 4 MG/ML IV SOLN
4.0000 mg | Freq: Once | INTRAVENOUS | Status: AC
  Administered 2016-07-18: 4 mg via INTRAVENOUS
  Filled 2016-07-18: qty 1

## 2016-07-18 MED ORDER — DIPHENHYDRAMINE HCL 12.5 MG/5ML PO ELIX
12.5000 mg | ORAL_SOLUTION | Freq: Four times a day (QID) | ORAL | Status: DC | PRN
  Filled 2016-07-18: qty 5

## 2016-07-18 MED ORDER — DEXTROSE 5 % IV SOLN
1000.0000 mg | Freq: Once | INTRAVENOUS | Status: DC
  Administered 2016-07-18: 1000 mg via INTRAVENOUS

## 2016-07-18 MED ORDER — DIPHENHYDRAMINE HCL 50 MG/ML IJ SOLN
12.5000 mg | Freq: Four times a day (QID) | INTRAMUSCULAR | Status: DC | PRN
  Administered 2016-07-18 – 2016-07-19 (×2): 12.5 mg via INTRAVENOUS
  Filled 2016-07-18 (×2): qty 1

## 2016-07-18 MED ORDER — ACETAMINOPHEN 325 MG PO TABS
ORAL_TABLET | ORAL | Status: AC
  Administered 2016-07-18: 650 mg via ORAL
  Filled 2016-07-18: qty 2

## 2016-07-18 MED ORDER — HEPARIN SODIUM (PORCINE) 1000 UNIT/ML IJ SOLN
INTRAMUSCULAR | Status: DC | PRN
  Administered 2016-07-18: 3000 [IU] via INTRAVENOUS

## 2016-07-18 MED ORDER — SODIUM CHLORIDE 0.9% FLUSH: 9.0000 mL | INTRAVENOUS | Status: DC | PRN

## 2016-07-18 MED ORDER — NALOXONE HCL 0.4 MG/ML IJ SOLN: 0.4000 mg | INTRAMUSCULAR | Status: DC | PRN

## 2016-07-18 MED ORDER — IBUPROFEN 600 MG PO TABS
600.0000 mg | ORAL_TABLET | Freq: Four times a day (QID) | ORAL | Status: DC | PRN
  Administered 2016-07-18 – 2016-07-19 (×3): 600 mg via ORAL
  Filled 2016-07-18 (×3): qty 1

## 2016-07-18 MED ORDER — FENTANYL CITRATE (PF) 100 MCG/2ML IJ SOLN
INTRAMUSCULAR | Status: DC | PRN
  Administered 2016-07-18: 25 ug via INTRAVENOUS
  Administered 2016-07-18 (×2): 50 ug via INTRAVENOUS
  Administered 2016-07-18: 25 ug via INTRAVENOUS
  Administered 2016-07-18: 50 ug via INTRAVENOUS

## 2016-07-18 SURGICAL SUPPLY — 19 items
BLOCK BEAD 300-500 MIC (Vascular Products) ×6 IMPLANT
BLOCK BEAD 500-700 (Vascular Products) ×6 IMPLANT
BLOCK BEAD 700-900 (Vascular Products) ×6 IMPLANT
CATH 5FR REUT (CATHETERS) ×3 IMPLANT
CATH BEACON 5.038 65CM KMP-01 (CATHETERS) ×3 IMPLANT
CATH MICROCATH PRGRT 2.8F 110 (CATHETERS) ×2 IMPLANT
CATH MICROCATH PRGRT 2.8F 130 (MICROCATHETER) ×2 IMPLANT
CATH PIG 70CM (CATHETERS) ×3 IMPLANT
CATH RIM 65CM (CATHETERS) ×3 IMPLANT
DEVICE PRESTO INFLATION (MISCELLANEOUS) ×3 IMPLANT
DEVICE STARCLOSE SE CLOSURE (Vascular Products) ×3 IMPLANT
DEVICE TORQUE (MISCELLANEOUS) ×3 IMPLANT
GLIDECATH 4FR STR (CATHETERS) ×3 IMPLANT
GLIDEWIRE STIFF .35X180X3 HYDR (WIRE) ×3 IMPLANT
MICROCATH PROGREAT 2.8F 110 CM (CATHETERS) ×6
MICROCATH PROGREAT 2.8F 130CM (MICROCATHETER) ×6
PACK ANGIOGRAPHY (CUSTOM PROCEDURE TRAY) ×3 IMPLANT
SHEATH BRITE TIP 5FRX11 (SHEATH) ×3 IMPLANT
WIRE J 3MM .035X145CM (WIRE) ×3 IMPLANT

## 2016-07-18 NOTE — Progress Notes (Signed)
Obstetric and Gynecology  Subjective  No further vaginal bleeding, still feeling a little weak.    Objective   Vitals:   07/18/16 0839 07/18/16 0956  BP: 130/74 137/78  Pulse: 84 91  Resp: 18 18  Temp: 98.9 F (37.2 C) 98.4 F (36.9 C)     Intake/Output Summary (Last 24 hours) at 07/18/16 1039 Last data filed at 07/17/16 1730  Gross per 24 hour  Intake              240 ml  Output                0 ml  Net              240 ml    General: NAD Cardiovascular: RRR Abdomen: soft, non-tender, large abdominal mass Extremities: no edema  Labs: Results for orders placed or performed during the hospital encounter of 07/16/16 (from the past 24 hour(s))  CBC     Status: Abnormal   Collection Time: 07/17/16  7:03 PM  Result Value Ref Range   WBC 6.4 3.6 - 11.0 K/uL   RBC 3.14 (L) 3.80 - 5.20 MIL/uL   Hemoglobin 7.2 (L) 12.0 - 16.0 g/dL   HCT 23.3 (L) 35.0 - 47.0 %   MCV 74.2 (L) 80.0 - 100.0 fL   MCH 22.8 (L) 26.0 - 34.0 pg   MCHC 30.8 (L) 32.0 - 36.0 g/dL   RDW 20.7 (H) 11.5 - 14.5 %   Platelets 282 150 - 440 K/uL    Cultures: Results for orders placed or performed during the hospital encounter of 09/28/15  Culture, group A strep     Status: None   Collection Time: 09/28/15  1:31 PM  Result Value Ref Range Status   Specimen Description THROAT  Final   Special Requests NONE  Final   Culture   Final    NO GROUP A STREP (S.PYOGENES) ISOLATED Performed at [facility]    Report Status 09/30/2015 FINAL  Final  Wet prep, genital     Status: None   Collection Time: 09/28/15  3:30 PM  Result Value Ref Range Status   Yeast Wet Prep HPF POC NONE SEEN NONE SEEN Final   Trich, Wet Prep NONE SEEN NONE SEEN Final   Clue Cells Wet Prep HPF POC NONE SEEN NONE SEEN Final   WBC, Wet Prep HPF POC NONE SEEN NONE SEEN Final   Sperm NONE SEEN  Final  Chlamydia/NGC rt PCR ([facility] only)     Status: None   Collection Time: 09/28/15  3:30 PM  Result Value Ref Range Status   Specimen source GC/Chlam ENDOCERVICAL  Final   Chlamydia Tr NOT DETECTED NOT DETECTED Final   N gonorrhoeae NOT DETECTED NOT DETECTED Final    Comment: (NOTE) 100  This methodology has not been evaluated in pregnant women or in 200  patients with a history of hysterectomy. 300 400  This methodology will not be performed on patients less than 39  years of age.     Imaging:  Assessment   51 y.o. with large uterine firboids vs sarcoma and acute uterine hemorrhage, undergoing [place] today  Plan   - Will recheck CBC this evening - Continue lysteda (will need to weight risk benefits given history of recent DVT vs acute bleeding), has also received depo shot - Vital otherwise stable - [place] this morning

## 2016-07-18 NOTE — Op Note (Signed)
[facility] Percutaneous Study/Intervention Procedural Note   Date of Surgery:  07/18/2016  Surgeon(s): Leotis Pain  Assistants:none  Pre-operative Diagnosis: Symptomatic uterine fibroids with dysfunctional uterine bleeding  Post-operative diagnosis: Same  Procedure(s) Performed: 1. Ultrasound guidance for vascular access right femoral artery 2. Catheter placement into left uterine artery and right uterine artery from right femoral approach 3. Aortogram and selective pelvic arteriograms including selective imaging of both uterine arteries 4. Micro-bead embolization with 15 cc of 700-900, 10 cc of 500-700, and 10 cc of 300-500  polyvinyl alcohol beads to the left uterine artery 5. Micro-bead embolization with 5 cc of 700-900, 10 cc of 500-700, and 10 cc of 300-500  polyvinyl alcohol beads to the right uterine artery  6. StarClose closure device right femoral artery  EBL: 5 cc  Contrast: 70 cc  Fluoro Time: 13.3 min  Moderate Conscious Sedation time: approximately 60 minutes using 5 mg of Versed and 200 mcg of Fentanyl  Indications: Patient is a 51 y.o. female with severe anemia requiring admission which was symptomatic and required blood transfusion with continued anemia from uterine fibroids and dysfunctional uterine bleeding. She was felt to be very high risk for hysterectomy due to the very large size of the fibroids. She is interested in having uterine artery embolization to treat her fibroids. Risks and benefits are discussed and informed consent is obtained  Procedure: The patient was identified and appropriate procedural time out was performed. The patient was then placed supine on the table and prepped and draped in the usual sterile fashion. Moderate conscious sedation was administered throughout the procedure with a face to face encounter with the patient throughout and  with my supervision of the RN administering medicines and monitoring the patients vital signs and mental status throughout from the start of the procedure until the patient was taken to the recovery room.  Ultrasound was used to evaluate the right common femoral artery. It was patent . A digital ultrasound image was acquired. A Seldinger needle was used to access the right common femoral artery under direct ultrasound guidance and a permanent image was performed. A 0.035 J wire was advanced without resistance and a 5Fr sheath was placed.3000 units of heparin were given. Pigtail catheter was placed into the aorta and an AP aortogram was performed. This demonstrated normal renal arteries and normal aorta and iliac segments without significant stenosis. On the initial imaging, significant flow through larger than average uterine arteries was identified and on the initial aortogram. I then crossed the aortic bifurcation and advanced to the left iliac bifurcation. Using a pigtail and then exchanging for a Kumpe catheter, I was able to cannulate the hypogastric artery on the left. Selective imaging was performed of the hypogastric artery which demonstrated an initial lateral branch and then a trifurcation, with the uterine artery being the most lateral artery of this trifurcation. Using a prograde micro-catheter, I was able to cannulate the left uterine artery and advance beyond its initial branches well into the left uterine artery. Her uterus and fibroids were so massive the uterine artery traveled upward to the mid abdomen. We went about half the way the uterine artery.  Selective imaging was performed. I then performed left uterine artery embolization using approximately 15 cc of 700-900  polyvinyl alcohol beads into the left uterine artery for selective embolization. This resulted in some improvement, but this was a very large artery with continued brisk flow. I then used 10 cc of the 500-700  polyvinyl  alcohol beats an additional 10 cc of 300-500  polyvinyl alcohol beads until there was minimal forward flow into the uterine artery going to the uterus. The microcatheter was then removed. I then turned my attention to the right uterine artery. I was able to cannulate the right hypogastric artery with the VS 1 catheter from the right femoral approach and using a Glidewire advanced this into the main right hypogastric artery. Selective imaging was performed in the right hypogastric artery which demonstrated again a proximal branch which was lateral going and then a true trifurcation. On this image, the uterine artery appeared to be the most medial of the 3 is best seen in an RAO projection.  I was able to advance the pro-great microcatheter into the right uterine artery and down beyond its primary branches near where it entered the uterus itself. Again, the artery was very large and due to the large size of the uterus went very far cephalad. I advanced the microcatheter until it started going cephalad at this artery was slightly smaller than the left. Selective imaging was performed. Approximately 5 cc of the 700-900  polyvinyl alcohol beads were then instilled into the right uterine artery for selective embolization. I then instilled another 10 cc of 500-700  polyvinyl alcohol beads and another 10 cc of 300-500  polyvinyl alcohol beads until there was minimal forward flow into the uterus from the right uterine artery. I then advanced the VS 1 catheter back into the aorta and performed an aortogram. This showed a marked reduction in the flow into the uterine arteries on nonselective imaging. The diagnostic catheter was then removed. I elected to terminate the procedure. The sheath was removed and StarClose closure device was deployed in the right femoral artery with excellent hemostatic result. The patient was taken to the recovery room in stable condition having tolerated the procedure  well.      Disposition: Patient was taken to the recovery room in stable condition having tolerated the procedure well.  Complications: None

## 2016-07-18 NOTE — H&P (Signed)
[facility] History & Physical Update  The patient was interviewed and re-examined.  [name] previous History and Physical has been reviewed and is unchanged.  There is no change in the plan of care. We plan to proceed with the scheduled procedure.  Leotis Pain, MD  07/18/2016, 2:44 PM

## 2016-07-19 DIAGNOSIS — D259 Leiomyoma of uterus, unspecified: Secondary | ICD-10-CM

## 2016-07-19 MED ORDER — HYDROCODONE-ACETAMINOPHEN 7.5-325 MG PO TABS
1.0000 | ORAL_TABLET | Freq: Four times a day (QID) | ORAL | Status: DC | PRN
  Administered 2016-07-20: 1 via ORAL
  Filled 2016-07-19: qty 1

## 2016-07-19 MED ORDER — DOCUSATE SODIUM 100 MG PO CAPS
100.0000 mg | ORAL_CAPSULE | Freq: Two times a day (BID) | ORAL | Status: DC
  Administered 2016-07-19 – 2016-07-20 (×3): 100 mg via ORAL
  Filled 2016-07-19 (×3): qty 1

## 2016-07-19 MED ORDER — KETOROLAC TROMETHAMINE 30 MG/ML IJ SOLN
30.0000 mg | Freq: Four times a day (QID) | INTRAMUSCULAR | Status: DC
  Administered 2016-07-19 – 2016-07-20 (×3): 30 mg via INTRAVENOUS
  Filled 2016-07-19 (×4): qty 1

## 2016-07-19 MED ORDER — ONDANSETRON HCL 4 MG/2ML IJ SOLN
4.0000 mg | Freq: Three times a day (TID) | INTRAMUSCULAR | Status: DC | PRN
  Administered 2016-07-19: 4 mg via INTRAVENOUS
  Filled 2016-07-19 (×2): qty 2

## 2016-07-19 NOTE — Progress Notes (Signed)
Benign Gynecology Progress Note  Admission Date: 07/16/2016 Current Date: 07/19/2016  Kaitlin Brown is a 51 y.o. G2P0020 HD#4 for large fibroid uterus.  Vaginal bleeding is minimal (Depo Provera, Lysteda).  Pain after [place] was severe last night, better this am .  History complicated by: Patient Active Problem List   Diagnosis Date Noted  . Anemia 07/17/2016  . Anemia due to chronic blood loss 07/16/2016  . Acute pulmonary embolism ([place]) 03/17/2016  . DVT (deep venous thrombosis) ([place]) 03/17/2016  . Fibroid uterus   . Anemia due to blood loss, chronic   . Symptomatic anemia 09/28/2015  . Uterine mass 09/28/2015   ROS and patient/family/surgical history, located on admission H&P note dated 07/16/2016, have been reviewed, and there are no changes except as noted below  Objective:   Temp:  [97 F (36.1 C)-98.5 F (36.9 C)] 98.4 F (36.9 C) (05/05 0819) Pulse Rate:  [72-93] 80 (05/05 0819) Resp:  [12-20] 17 (05/05 0830) BP: (109-131)/(60-79) 109/67 (05/05 0819) SpO2:  [92 %-100 %] 100 % (05/05 0830) FiO2 (%):  [30 %] 30 % (05/05 0000) I/O last 3 completed shifts: In: 22 [P.O.:960; I.V.:750] Out: 850 [Urine:850] No intake/output data recorded.  Physical exam: General appearance: alert, cooperative and appears stated age Abdomen: Large fibroid palpable and soft, non-tender; bowel sounds normal; no masses, no organomegaly GU: No gross VB Lungs: clear to auscultation bilaterally Heart: regular rate and rhythm and no MRGs Extremities: no redness or tenderness in the calves or thighs, no edema Skin: no lesions Psych: appropriate  Recent Labs Lab 07/17/16 0523 07/17/16 1903 07/18/16 1840  WBC 5.3 6.4 7.0  HGB 6.3* 7.2* 7.2*  HCT 21.1* 23.3* 23.3*  PLT 252 282 288   Assessment & Plan:  Fibroid pain and bleeding; bleeding resolved Post procedure pain medicine    Convert from PCA to Toradol and po narcotics On d/c, Continue pain meds, Colace.  Depo Provera in  system, wean off Lysteda as well.  Barnett Applebaum, MD, [facility], [facility] 07/19/2016  10:14 AM

## 2016-07-19 NOTE — Progress Notes (Signed)
1 Day Post-Op   Subjective/Chief Complaint: Doing well. Notes pain improved from last night. PCA currently with moderate control. Otherwise without complaint.   Objective: Vital signs in last 24 hours: Temp:  [97 F (36.1 C)-98.5 F (36.9 C)] 98.4 F (36.9 C) (05/05 0819) Pulse Rate:  [72-93] 80 (05/05 0819) Resp:  [12-20] 17 (05/05 0830) BP: (109-137)/(60-79) 109/67 (05/05 0819) SpO2:  [92 %-100 %] 100 % (05/05 0830) FiO2 (%):  [30 %] 30 % (05/05 0000) Last BM Date: 07/15/16  Intake/Output from previous day: 05/04 0701 - 05/05 0700 In: 1710 [P.O.:960; I.V.:750] Out: 850 [Urine:850] Intake/Output this shift: No intake/output data recorded.  General appearance: alert and no distress GI: soft, non-tender; bowel sounds normal; no masses,  no organomegaly and Right groin site- soft, dry, no hematoma  Lab Results:   Recent Labs  07/17/16 1903 07/18/16 1840  WBC 6.4 7.0  HGB 7.2* 7.2*  HCT 23.3* 23.3*  PLT 282 288   BMET  Recent Labs  07/16/16 1335  NA 139  K 3.0*  CL 106  CO2 25  GLUCOSE 119*  BUN 6  CREATININE 0.71  CALCIUM 8.2*   PT/INR  Recent Labs  07/16/16 1335  LABPROT 13.0  INR 0.98   ABG No results for input(s): PHART, HCO3 in the last 72 hours.  Invalid input(s): PCO2, PO2  Studies/Results: US Venous Img Lower Bilateral  Result Date: 07/17/2016 CLINICAL DATA:  History of pulmonary embolus in DVT in January 2018. Follow-up study. EXAM: BILATERAL LOWER EXTREMITY VENOUS DOPPLER ULTRASOUND TECHNIQUE: Gray-scale sonography with graded compression, as well as color Doppler and duplex ultrasound were performed to evaluate the lower extremity deep venous systems from the level of the common femoral vein and including the common femoral, femoral, profunda femoral, popliteal and calf veins including the posterior tibial, peroneal and gastrocnemius veins when visible. The superficial great saphenous vein was also interrogated. Spectral Doppler was  utilized to evaluate flow at rest and with distal augmentation maneuvers in the common femoral, femoral and popliteal veins. COMPARISON:  None. FINDINGS: RIGHT LOWER EXTREMITY Common Femoral Vein: No evidence of thrombus. Normal compressibility, respiratory phasicity and response to augmentation. Saphenofemoral Junction: No evidence of thrombus. Normal compressibility and flow on color Doppler imaging. Profunda Femoral Vein: No evidence of thrombus. Normal compressibility and flow on color Doppler imaging. Femoral Vein: No evidence of thrombus. Normal compressibility, respiratory phasicity and response to augmentation. Popliteal Vein: No evidence of thrombus. Normal compressibility, respiratory phasicity and response to augmentation. Calf Veins: No evidence of thrombus. Normal compressibility and flow on color Doppler imaging. Superficial Great Saphenous Vein: No evidence of thrombus. Normal compressibility and flow on color Doppler imaging. Venous Reflux:  None. Other Findings:  None. LEFT LOWER EXTREMITY Common Femoral Vein: No evidence of thrombus. Normal compressibility, respiratory phasicity and response to augmentation. Saphenofemoral Junction: No evidence of thrombus. Normal compressibility and flow on color Doppler imaging. Profunda Femoral Vein: No evidence of thrombus. Normal compressibility and flow on color Doppler imaging. Femoral Vein: No evidence of thrombus. Normal compressibility, respiratory phasicity and response to augmentation. Popliteal Vein: No evidence of thrombus. Normal compressibility, respiratory phasicity and response to augmentation. Calf Veins: No evidence of thrombus. Normal compressibility and flow on color Doppler imaging. Superficial Great Saphenous Vein: No evidence of thrombus. Normal compressibility and flow on color Doppler imaging. Venous Reflux:  None. Other Findings:  None. IMPRESSION: No evidence of DVT with either lower extremity. Electronically Signed   By: Nolon Nations M.D.  On: 07/17/2016 10:48    Anti-infectives: Anti-infectives    Start     Dose/Rate Route Frequency Ordered Stop   07/18/16 1545  ceFAZolin (ANCEF) IVPB 1 g/50 mL premix     1 g 100 mL/hr over 30 Minutes Intravenous  Once 07/18/16 1534 07/18/16 1605   07/18/16 1530  ceFAZolin (ANCEF) 1,000 mg in dextrose 5 % 100 mL IVPB  Status:  Discontinued     1,000 mg 220 mL/hr over 30 Minutes Intravenous  Once 07/18/16 1517 07/18/16 1533      Assessment/Plan: s/p Procedure(s): Embolization, uterine arteries (Bilateral) Toradol/ Norco transition for pain control- D/C PCA  OOB as tolerated OK from Vascular standpoint to d/c home once pain controlled. No heavy lifting for 1 week upon discharge.  LOS: 3 days    Jamesetta So A 07/19/2016

## 2016-07-20 DIAGNOSIS — N938 Other specified abnormal uterine and vaginal bleeding: Secondary | ICD-10-CM

## 2016-07-20 DIAGNOSIS — Z86718 Personal history of other venous thrombosis and embolism: Secondary | ICD-10-CM

## 2016-07-20 DIAGNOSIS — D259 Leiomyoma of uterus, unspecified: Secondary | ICD-10-CM

## 2016-07-20 LAB — CBC
HEMATOCRIT: 23.5 % — AB (ref 35.0–47.0)
HEMOGLOBIN: 6.8 g/dL — AB (ref 12.0–16.0)
MCH: 21.9 pg — ABNORMAL LOW (ref 26.0–34.0)
MCHC: 29.1 g/dL — ABNORMAL LOW (ref 32.0–36.0)
MCV: 75.3 fL — ABNORMAL LOW (ref 80.0–100.0)
Platelets: 328 10*3/uL (ref 150–440)
RBC: 3.12 MIL/uL — AB (ref 3.80–5.20)
RDW: 23.3 % — ABNORMAL HIGH (ref 11.5–14.5)
WBC: 7.1 10*3/uL (ref 3.6–11.0)

## 2016-07-20 MED ORDER — HYDROCODONE-ACETAMINOPHEN 7.5-325 MG PO TABS: 1.0000 | ORAL_TABLET | Freq: Four times a day (QID) | ORAL | 0 refills | Status: DC | PRN

## 2016-07-20 MED ORDER — DOCUSATE SODIUM 100 MG PO CAPS: 100.0000 mg | ORAL_CAPSULE | Freq: Two times a day (BID) | ORAL | 0 refills | Status: DC

## 2016-07-20 NOTE — Progress Notes (Signed)
[place] [facility] [place] [id number] [place] [address] Phone: [phone] Fax: [phone]  Jul 20, 2016  Patient: Kaitlin Brown  Date of Birth: 02-Mar-1966  Date of Visit: 07/18/2016    To Whom It May Concern:  [name] was seen and treated in our  Hospital on 07/16/2016. [place] her significant other should be excused from any work or obligation on Jul 18, 2016 due to this being her day of surgery.  Sincerely,  Barnett Applebaum, MD [facility]

## 2016-07-20 NOTE — Discharge Summary (Signed)
Gynecology Physician Postoperative Discharge Summary  Patient ID: Kaitlin Brown MRN: [id number] DOB/AGE: [birth year] 51 y.o.  Admit Date: 07/16/2016 Discharge Date: 07/20/2016  Diagnoses: Fibroid Uterus and Anemia  Procedures: Procedure(s) (LRB): Embolization, uterine arteries (Bilateral)  Significant Labs: CBC Latest Ref Rng & Units 07/20/2016 07/18/2016 07/17/2016  WBC 3.6 - 11.0 K/uL 7.1 7.0 6.4  Hemoglobin 12.0 - 16.0 g/dL 6.8(L) 7.2(L) 7.2(L)  Hematocrit 35.0 - 47.0 % 23.5(L) 23.3(L) 23.3(L)  Platelets 150 - 440 K/uL 328 288 282    Hospital Course:  Kaitlin Brown is a 51 y.o. [id number]  admitted for severe anemia with blood loss and fibroid uterus.  She underwent BLOOD TRANSFUSION and also with vascular underwent the procedures as mentioned above, her operation was uncomplicated. For further details about surgery, please refer to the operative report. Patient had an uncomplicated postoperative course. By time of discharge on POD#2, her pain was controlled on oral pain medications; she was ambulating, voiding without difficulty, tolerating regular diet and passing flatus. She was deemed stable for discharge to home.   Discharge Exam: Blood pressure 111/90, pulse 73, temperature 98.2 F (36.8 C), temperature source Oral, resp. rate 18, height 5\' 6"  (1.676 m), weight 190 lb (86.2 kg), last menstrual period 07/09/2016, SpO2 100 %. General appearance: alert and no distress  Resp: clear to auscultation bilaterally  Cardio: regular rate and rhythm  GI: LARGE UTERUS to UMBILICUS, non-tender; bowel sounds normal; no masses, no organomegaly.  Incision: C/D/I, no erythema, no drainage noted Pelvic: scant blood on pad  Extremities: extremities normal, atraumatic, no cyanosis or edema and Homans sign is negative, no sign of DVT  Discharged Condition: Stable  Disposition: 01-Home or Self Care  Discharge Instructions    Call MD for:  redness, tenderness, or signs of infection  (pain, swelling, redness, odor or green/yellow discharge around incision site)    Complete by:  As directed    Call MD for:  severe uncontrolled pain    Complete by:  As directed    Call MD for:  temperature >100.4    Complete by:  As directed    Diet general    Complete by:  As directed    Increase activity slowly    Complete by:  As directed    Lifting restrictions    Complete by:  As directed    No heavy lifting (>30lbs) for 6 weeks   No dressing needed    Complete by:  As directed    Have dressing over incision removed prior to discharge.  Keep OnQ Pain Pump in place for 96 hours (4 days) then remove at home.   Sexual Activity Restrictions    Complete by:  As directed    No sexual activity for 6 weeks     Allergies as of 07/20/2016   No Known Allergies     Medication List    STOP taking these medications   HYDROcodone-acetaminophen 5-325 MG tablet Commonly known as:  NORCO Replaced by:  HYDROcodone-acetaminophen 7.5-325 MG tablet   medroxyPROGESTERone 10 MG tablet Commonly known as:  PROVERA     TAKE these medications   apixaban 5 MG Tabs tablet Commonly known as:  ELIQUIS Take 1 tablet (5 mg total) by mouth 2 (two) times daily. 10mg  BID for 1 week and then 5 mg BID.   docusate sodium 100 MG capsule Commonly known as:  COLACE Take 1 capsule (100 mg total) by mouth 2 (two) times daily.   ferrous gluconate 324 MG tablet  Commonly known as:  FERGON Take 1 tablet (324 mg total) by mouth daily with breakfast.   HYDROcodone-acetaminophen 7.5-325 MG tablet Commonly known as:  NORCO Take 1 tablet by mouth every 6 (six) hours as needed for moderate pain or severe pain. Replaces:  HYDROcodone-acetaminophen 5-325 MG tablet   lisinopril-hydrochlorothiazide 10-12.5 MG tablet Commonly known as:  PRINZIDE,ZESTORETIC Take 1 tablet by mouth daily.      Follow-up Information    Gae Dry, MD Follow up in 2 day(s).   Specialty:  Obstetrics and Gynecology Why:  if  needed (Keep appt May 19 with [place] physician, this may be all she needs) Contact information: [place] [address] [phone]           Barnett Applebaum, MD

## 2016-07-20 NOTE — Discharge Instructions (Signed)
Follow up sooner fever greater than 100.5, problems breathing or pain not helped by medication, severe bleeding (saturating more than one pad an hour or large palm sized clots), or severe depression No driving while taking narcotics, No heavy lifting x 6 wks. Follow up sooner if you have any incisional concerns such as increased redness, swelling, discharge or increase in pain at incision site. No douches, intercourse , tampons, or enemas for 6 weeks. Anemia, Nonspecific Anemia is a condition in which the concentration of red blood cells or hemoglobin in the blood is below normal. Hemoglobin is a substance in red blood cells that carries oxygen to the tissues of the body. Anemia results in not enough oxygen reaching these tissues. What are the causes? Common causes of anemia include:  Excessive bleeding. Bleeding may be internal or external. This includes excessive bleeding from periods (in women) or from the intestine.  Poor nutrition.  Chronic kidney, thyroid, and liver disease.  Bone marrow disorders that decrease red blood cell production.  Cancer and treatments for cancer.  HIV, AIDS, and their treatments.  Spleen problems that increase red blood cell destruction.  Blood disorders.  Excess destruction of red blood cells due to infection, medicines, and autoimmune disorders. What are the signs or symptoms?  Minor weakness.  Dizziness.  Headache.  Palpitations.  Shortness of breath, especially with exercise.  Paleness.  Cold sensitivity.  Indigestion.  Nausea.  Difficulty sleeping.  Difficulty concentrating. Symptoms may occur suddenly or they may develop slowly. How is this diagnosed? Additional blood tests are often needed. These help your health care provider determine the best treatment. Your health care provider will check your stool for blood and look for other causes of blood loss. How is this treated? Treatment varies depending on the cause of the anemia.  Treatment can include:  Supplements of iron, vitamin A21, or folic acid.  Hormone medicines.  A blood transfusion. This may be needed if blood loss is severe.  Hospitalization. This may be needed if there is significant continual blood loss.  Dietary changes.  Spleen removal. Follow these instructions at home: Keep all follow-up appointments. It often takes many weeks to correct anemia, and having your health care provider check on your condition and your response to treatment is very important. Get help right away if:  You develop extreme weakness, shortness of breath, or chest pain.  You become dizzy or have trouble concentrating.  You develop heavy vaginal bleeding.  You develop a rash.  You have bloody or black, tarry stools.  You faint.  You vomit up blood.  You vomit repeatedly.  You have abdominal pain.  You have a fever or persistent symptoms for more than 2-3 days.  You have a fever and your symptoms suddenly get worse.  You are dehydrated. This information is not intended to replace advice given to you by your health care provider. Make sure you discuss any questions you have with your health care provider. Document Released: 04/10/2004 Document Revised: 08/15/2015 Document Reviewed: 08/27/2012 Elsevier Interactive Patient Education  2017 [employer].

## 2016-07-20 NOTE — Progress Notes (Signed)
All discharge instructions given to patient and they voice understanding of all instructions given.Note given as requested for visitor by DR Kenton Kingfisher. Prescription given for Norco. RN gave her only tylenol this am as she planned to drive herself home. RN explained to make sure that she spaced the Norco out from the tylenol 4 hours. She asked that RN take her temp again before discharge and it was 98.6. Several times this am she has reported feeling like she has a fever but we have taken it and it was not. She has only walked in the room. CNA was going to walk her in the hall prior to D/C but she stated that she just wants to go home. Anne Ng, CNA pushed her out in wheelchair out to her car in ER parking lot. Dr Kenton Kingfisher was aware and agreed that it was ok that she was driving herself home. Patient was assisted into her car for discharge

## 2016-07-21 ENCOUNTER — Encounter: Payer: Self-pay | Admitting: Vascular Surgery

## 2016-07-22 ENCOUNTER — Emergency Department: Payer: Self-pay

## 2016-07-22 ENCOUNTER — Emergency Department
Admission: EM | Admit: 2016-07-22 | Discharge: 2016-07-22 | Disposition: A | Discharge status: Home / Self Care | Payer: Self-pay | Attending: Emergency Medicine | Admitting: Emergency Medicine

## 2016-07-22 ENCOUNTER — Emergency Department
Admission: EM | Admit: 2016-07-22 | Discharge: 2016-07-22 | Discharge status: Against Medical Advice | Payer: Self-pay | Attending: Emergency Medicine | Admitting: Emergency Medicine

## 2016-07-22 ENCOUNTER — Encounter: Payer: Self-pay | Admitting: *Deleted

## 2016-07-22 DIAGNOSIS — R079 Chest pain, unspecified: Secondary | ICD-10-CM

## 2016-07-22 DIAGNOSIS — I10 Essential (primary) hypertension: Secondary | ICD-10-CM | POA: Insufficient documentation

## 2016-07-22 DIAGNOSIS — Z5321 Procedure and treatment not carried out due to patient leaving prior to being seen by health care provider: Secondary | ICD-10-CM | POA: Insufficient documentation

## 2016-07-22 DIAGNOSIS — R0789 Other chest pain: Secondary | ICD-10-CM | POA: Insufficient documentation

## 2016-07-22 DIAGNOSIS — R42 Dizziness and giddiness: Secondary | ICD-10-CM | POA: Insufficient documentation

## 2016-07-22 DIAGNOSIS — Z79899 Other long term (current) drug therapy: Secondary | ICD-10-CM | POA: Insufficient documentation

## 2016-07-22 DIAGNOSIS — R0602 Shortness of breath: Secondary | ICD-10-CM | POA: Insufficient documentation

## 2016-07-22 DIAGNOSIS — R110 Nausea: Secondary | ICD-10-CM | POA: Insufficient documentation

## 2016-07-22 DIAGNOSIS — D649 Anemia, unspecified: Secondary | ICD-10-CM | POA: Insufficient documentation

## 2016-07-22 LAB — CBC WITH DIFFERENTIAL/PLATELET
Basophils Absolute: 0.1 10*3/uL (ref 0–0.1)
Basophils Relative: 1 %
EOS ABS: 0.1 10*3/uL (ref 0–0.7)
EOS PCT: 2 %
HCT: 23.7 % — ABNORMAL LOW (ref 35.0–47.0)
HEMOGLOBIN: 7.2 g/dL — AB (ref 12.0–16.0)
LYMPHS ABS: 0.9 10*3/uL — AB (ref 1.0–3.6)
Lymphocytes Relative: 17 %
MCH: 22.7 pg — ABNORMAL LOW (ref 26.0–34.0)
MCHC: 30.2 g/dL — ABNORMAL LOW (ref 32.0–36.0)
MCV: 75.3 fL — ABNORMAL LOW (ref 80.0–100.0)
Monocytes Absolute: 0.3 10*3/uL (ref 0.2–0.9)
Monocytes Relative: 6 %
NEUTROS PCT: 74 %
Neutro Abs: 4.3 10*3/uL (ref 1.4–6.5)
PLATELETS: 470 10*3/uL — AB (ref 150–440)
RBC: 3.15 MIL/uL — AB (ref 3.80–5.20)
RDW: 23.2 % — ABNORMAL HIGH (ref 11.5–14.5)
WBC: 5.7 10*3/uL (ref 3.6–11.0)

## 2016-07-22 LAB — BASIC METABOLIC PANEL
ANION GAP: 5 (ref 5–15)
BUN: <5 mg/dL — ABNORMAL LOW (ref 6–20)
CALCIUM: 8.4 mg/dL — AB (ref 8.9–10.3)
CO2: 26 mmol/L (ref 22–32)
Chloride: 106 mmol/L (ref 101–111)
Creatinine, Ser: 0.58 mg/dL (ref 0.44–1.00)
Glucose, Bld: 128 mg/dL — ABNORMAL HIGH (ref 65–99)
Potassium: 3.7 mmol/L (ref 3.5–5.1)
Sodium: 137 mmol/L (ref 135–145)

## 2016-07-22 LAB — COMPREHENSIVE METABOLIC PANEL
ALT: 15 U/L (ref 14–54)
ANION GAP: 6 (ref 5–15)
AST: 21 U/L (ref 15–41)
Albumin: 3.3 g/dL — ABNORMAL LOW (ref 3.5–5.0)
Alkaline Phosphatase: 54 U/L (ref 38–126)
BUN: <5 mg/dL — ABNORMAL LOW (ref 6–20)
CO2: 27 mmol/L (ref 22–32)
Calcium: 8.5 mg/dL — ABNORMAL LOW (ref 8.9–10.3)
Chloride: 107 mmol/L (ref 101–111)
Creatinine, Ser: 0.74 mg/dL (ref 0.44–1.00)
Glucose, Bld: 91 mg/dL (ref 65–99)
POTASSIUM: 3.6 mmol/L (ref 3.5–5.1)
SODIUM: 140 mmol/L (ref 135–145)
TOTAL PROTEIN: 6.9 g/dL (ref 6.5–8.1)
Total Bilirubin: 0.3 mg/dL (ref 0.3–1.2)

## 2016-07-22 LAB — CBC
HCT: 23 % — ABNORMAL LOW (ref 35.0–47.0)
Hemoglobin: 6.9 g/dL — ABNORMAL LOW (ref 12.0–16.0)
MCH: 22.3 pg — ABNORMAL LOW (ref 26.0–34.0)
MCHC: 29.8 g/dL — ABNORMAL LOW (ref 32.0–36.0)
MCV: 74.9 fL — AB (ref 80.0–100.0)
PLATELETS: 466 10*3/uL — AB (ref 150–440)
RBC: 3.07 MIL/uL — ABNORMAL LOW (ref 3.80–5.20)
RDW: 23.2 % — AB (ref 11.5–14.5)
WBC: 6.1 10*3/uL (ref 3.6–11.0)

## 2016-07-22 LAB — TROPONIN I: Troponin I: <0.03 ng/mL (ref <0.03)

## 2016-07-22 MED ORDER — IOPAMIDOL (ISOVUE-370) INJECTION 76%
75.0000 mL | Freq: Once | INTRAVENOUS | Status: AC | PRN
  Administered 2016-07-22: 75 mL via INTRAVENOUS

## 2016-07-22 MED ORDER — ACETAMINOPHEN 500 MG PO TABS
1000.0000 mg | ORAL_TABLET | Freq: Once | ORAL | Status: AC
  Administered 2016-07-22: 500 mg via ORAL

## 2016-07-22 MED ORDER — ACETAMINOPHEN 500 MG PO TABS
ORAL_TABLET | ORAL | Status: AC
  Administered 2016-07-22: 21:00:00
  Filled 2016-07-22: qty 1

## 2016-07-22 NOTE — ED Provider Notes (Signed)
[facility] Emergency Department Provider Note  ____________________________________________  Time seen: Approximately 10:18 PM  I have reviewed the triage vital signs and the nursing notes.   HISTORY  Chief Complaint Shortness of Breath and Weakness    HPI Quintavia Rogstad is a 51 y.o. female who complains of shortness of breath for 2 days associated with chest pain as moderate intensity, aching, radiating to the back, worse with breathing. No alleviating factors. She was discharged from the hospital 2 days ago from having a uterine artery embolization procedure due to blood loss anemia from menorrhagia and fibroids.     Past Medical History:  Diagnosis Date  . Anemia due to blood loss, chronic    due to menorrhagia  . Back pain   . Bladder infection   . Chicken pox   . Fibroid uterus   . Hypertension      Patient Active Problem List   Diagnosis Date Noted  . Anemia 07/17/2016  . Anemia due to chronic blood loss 07/16/2016  . Acute pulmonary embolism ([place]) 03/17/2016  . DVT (deep venous thrombosis) ([place]) 03/17/2016  . Fibroid uterus   . Anemia due to blood loss, chronic   . Symptomatic anemia 09/28/2015  . Uterine mass 09/28/2015     Past Surgical History:  Procedure Laterality Date  . EMBOLIZATION Bilateral 07/18/2016   Procedure: Embolization, uterine arteries;  Surgeon: Algernon Huxley, MD;  Location: [place] [facility];  Service: Cardiovascular;  Laterality: Bilateral;  . HYSTEROSCOPY W/D&C  10/2014   [facility], Dr Guadalupe Maple  . none       Prior to Admission medications   Medication Sig Start Date End Date Taking? Authorizing Provider  docusate sodium (COLACE) 100 MG capsule Take 1 capsule (100 mg total) by mouth 2 (two) times daily. 07/20/16  Yes Gae Dry, MD  ferrous gluconate (FERGON) 324 MG tablet Take 1 tablet (324 mg total) by mouth daily with breakfast. 09/29/15  Yes Hower, Aaron Mose, MD  HYDROcodone-acetaminophen (NORCO)  7.5-325 MG tablet Take 1 tablet by mouth every 6 (six) hours as needed for moderate pain or severe pain. 07/20/16  Yes Gae Dry, MD  lisinopril-hydrochlorothiazide (PRINZIDE,ZESTORETIC) 10-12.5 MG per tablet Take 1 tablet by mouth daily.   Yes [provider]  apixaban (ELIQUIS) 5 MG TABS tablet Take 1 tablet (5 mg total) by mouth 2 (two) times daily. 10mg  BID for 1 week and then 5 mg BID. Patient not taking: Reported on 07/22/2016 03/18/16   Hillary Bow, MD     Allergies Patient has no known allergies.   Family History  Problem Relation Age of Onset  . Hypertension Mother   . Diabetes Mother   . Glaucoma Mother   . Heart disease Father     Congestive heart condition  . Clotting disorder Maternal Grandmother     Social History Social History  Substance Use Topics  . Smoking status: Never Smoker  . Smokeless tobacco: Never Used  . Alcohol use Yes     Comment: occasionally    Review of Systems  Constitutional:   No fever or chills.  ENT:   No sore throat. No rhinorrhea. Lymphatic: No swollen glands, No extremity swelling Endocrine: No hot/cold flashes. No significant weight change. No neck swelling. Cardiovascular:   Positive chest pain without syncope. Respiratory:   Positive shortness of breath without cough. Gastrointestinal:   Negative for abdominal pain, vomiting and diarrhea.  Genitourinary:   Negative for dysuria or difficulty urinating. Musculoskeletal:  Negative for focal pain or swelling Neurological:   Negative for headaches or weakness. No dizziness, no orthostatic symptoms All other systems reviewed and are negative except as documented above in ROS and HPI.  ____________________________________________   PHYSICAL EXAM:  VITAL SIGNS: ED Triage Vitals  Enc Vitals Group     BP 07/22/16 1913 128/81     Pulse Rate 07/22/16 1913 87     Resp 07/22/16 1913 18     Temp 07/22/16 1913 98.8 F (37.1 C)     Temp Source 07/22/16 1913 Oral      SpO2 07/22/16 1913 100 %     Weight 07/22/16 1914 219 lb (99.3 kg)     Height 07/22/16 1914 5\' 6"  (1.676 m)     Head Circumference --      Peak Flow --      Pain Score 07/22/16 1913 6     Pain Loc --      Pain Edu? --      Excl. in [place]? --     Vital signs reviewed, nursing assessments reviewed.   Constitutional:   Alert and oriented. Well appearing and in no distress. Eyes:   No scleral icterus. No conjunctival pallor. PERRL. EOMI.  No nystagmus. ENT   Head:   Normocephalic and atraumatic.   Nose:   No congestion/rhinnorhea. No septal hematoma   Mouth/Throat:   MMM, no pharyngeal erythema. No peritonsillar mass.    Neck:   No stridor. No SubQ emphysema. No meningismus. Hematological/Lymphatic/Immunilogical:   No cervical lymphadenopathy. Cardiovascular:   RRR. Symmetric bilateral radial and DP pulses.  No murmurs.  Respiratory:   Normal respiratory effort without tachypnea nor retractions. Breath sounds are clear and equal bilaterally. No wheezes/rales/rhonchi. Gastrointestinal:   Soft and nontender. Non distended. There is no CVA tenderness.  No rebound, rigidity, or guarding. Genitourinary:   deferred Musculoskeletal:   Normal range of motion in all extremities. No joint effusions.  No lower extremity tenderness.  No edema. Neurologic:   Normal speech and language.  CN 2-10 normal. Motor grossly intact. No gross focal neurologic deficits are appreciated.  Skin:    Skin is warm, dry and intact. No rash noted.  No petechiae, purpura, or bullae.  ____________________________________________    LABS (pertinent positives/negatives) (all labs ordered are listed, but only abnormal results are displayed) Labs Reviewed  CBC WITH DIFFERENTIAL/PLATELET - Abnormal; Notable for the following:       Result Value   RBC 3.15 (*)    Hemoglobin 7.2 (*)    HCT 23.7 (*)    MCV 75.3 (*)    MCH 22.7 (*)    MCHC 30.2 (*)    RDW 23.2 (*)    Platelets 470 (*)    Lymphs Abs 0.9  (*)    All other components within normal limits  COMPREHENSIVE METABOLIC PANEL - Abnormal; Notable for the following:    BUN <5 (*)    Calcium 8.5 (*)    Albumin 3.3 (*)    All other components within normal limits  TROPONIN I   ____________________________________________   EKG  Interpreted by me Normal sinus rhythm rate of 98, normal axis and intervals. Normal QRS ST segments and T waves.  ____________________________________________    RADIOLOGY  Dg Chest 2 View  Result Date: 07/22/2016 CLINICAL DATA:  Right-sided chest pain and pressure with nausea and shortness of breath EXAM: CHEST  2 VIEW COMPARISON:  May 28, 2016 FINDINGS: Lungs are clear. Heart size and pulmonary vascularity  are normal. No adenopathy. No bone lesions. No pneumothorax. IMPRESSION: No edema or consolidation. Electronically Signed   By: Lowella Grip III M.D.   On: 07/22/2016 19:47   Ct Angio Chest Pe W Or Wo Contrast  Result Date: 07/22/2016 CLINICAL DATA:  Chest pain and shortness of breath. Recent hospital admission for anemia and uterine artery ablation. History of pulmonary embolus. EXAM: CT ANGIOGRAPHY CHEST WITH CONTRAST TECHNIQUE: Multidetector CT imaging of the chest was performed using the standard protocol during bolus administration of intravenous contrast. Multiplanar CT image reconstructions and MIPs were obtained to evaluate the vascular anatomy. CONTRAST:  75 cc Isovue 370 IV COMPARISON:  Radiographs earlier this day.  Chest CT 03/17/2016 FINDINGS: Cardiovascular: Majority of the previous pulmonary emboli have resolved. There is minimal residual chronic nonocclusive thrombus in subsegmental branch of the right lower lobe. Other sites of bilateral pulmonary emboli have resolved. The heart is normal in size. Thoracic aorta is normal in caliber. Left vertebral artery arises directly from the thoracic aorta, normal variant. No evidence of dissection. Mediastinum/Nodes: No mediastinal, hilar, or  axillary adenopathy. No mediastinal mass. No pericardial effusion. The esophagus is decompressed. Visualized thyroid gland is normal. Lungs/Pleura: Minimal scattered subsegmental atelectasis in both lungs. Tiny bilateral pleural effusions. No confluent consolidation. No pulmonary edema. Trachea mainstem bronchi are patent. Upper Abdomen: Hemangioma in the right lobe of the liver. No acute abnormality. Musculoskeletal: There are no acute or suspicious osseous abnormalities. Review of the MIP images confirms the above findings. IMPRESSION: 1. Majority of the prior pulmonary emboli have resolved. Minimal residual chronic thrombus in a subsegmental right lower lobe pulmonary artery. No progression or acute pulmonary embolus. 2. Trace bilateral pleural effusions. Electronically Signed   By: Jeb Levering M.D.   On: 07/22/2016 21:20    ____________________________________________   PROCEDURES Procedures  ____________________________________________   INITIAL IMPRESSION / ASSESSMENT AND PLAN / ED COURSE  Pertinent labs & imaging results that were available during my care of the patient were reviewed by me and considered in my medical decision making (see chart for details).       Clinical Course as of Jul 22 2216  Tue Jul 22, 2016  2011 P/w chest pain, SOB x 2 days. Elevated risk of PE, will get CTA chest. Hb is stable.  [PS]    Clinical Course User Index [PS] Carrie Mew, MD    ----------------------------------------- 10:24 PM on 07/22/2016 -----------------------------------------  CT negative. We'll discharge home, likely short of breath and fatigued related to the stress of recovering from the procedure in the setting of chronic anemia. Does not appear to require an emergent transfusion as her hemoglobin is stable and her vital signs are normal. Close follow-up with primary care. Anticipate that now that she's had the embolization procedure, her bleeding will abate and her  hemoglobin will improve.   ____________________________________________   FINAL CLINICAL IMPRESSION(S) / ED DIAGNOSES  Final diagnoses:  Chest pain  Shortness of breath  Chronic anemia      New Prescriptions   No medications on file     Portions of this note were generated with dragon dictation software. Dictation errors may occur despite best attempts at proofreading.    Carrie Mew, MD 07/22/16 2224

## 2016-07-22 NOTE — ED Notes (Signed)
Pt refused chest xray reporting that she had multiple while admitted to the hospital and would like to see a doctor before another is performed.

## 2016-07-22 NOTE — ED Notes (Signed)
Patient transported to CT 

## 2016-07-22 NOTE — Discharge Instructions (Signed)
Your CT scan of the chest did not show any blood clots or other acute issues.  Follow up with  your primary care doctor to continue monitoring your symptoms.

## 2016-07-22 NOTE — ED Notes (Signed)
Patient transported to X-ray 

## 2016-07-22 NOTE — ED Triage Notes (Addendum)
Per EMS, pt reports [facility] and chest pain that began Sunday. Pt reports she was recently admitted to the 3rd floor here at this hospital due to anemia and had uterine artery embolization. Pt states since Sunday when she was discharged she also has had increased weakness. Pt A&O at this time, EMS reports WNL vitals.

## 2016-07-22 NOTE — ED Notes (Signed)
RN called pt. No answer. Message left requesting pt return to ED.

## 2016-07-22 NOTE — ED Triage Notes (Signed)
Pt reports having right sided chest pressure that is radiating to right arm and right side of back. Pt reports pain has been present since Saturday with nausea, dizziness and increased SOB with activity and while talking. Pt reports having had surgery (uterian artery emobolization) on Friday. Pt reports having had decreased Hgb levels that had increased prior to discharge.

## 2016-12-09 ENCOUNTER — Emergency Department: Payer: Self-pay

## 2016-12-09 ENCOUNTER — Emergency Department
Admission: EM | Admit: 2016-12-09 | Discharge: 2016-12-09 | Disposition: A | Discharge status: Home / Self Care | Payer: Self-pay | Attending: Emergency Medicine | Admitting: Emergency Medicine

## 2016-12-09 ENCOUNTER — Encounter: Payer: Self-pay | Admitting: Emergency Medicine

## 2016-12-09 DIAGNOSIS — M791 Myalgia, unspecified site: Secondary | ICD-10-CM

## 2016-12-09 DIAGNOSIS — N898 Other specified noninflammatory disorders of vagina: Secondary | ICD-10-CM | POA: Insufficient documentation

## 2016-12-09 DIAGNOSIS — I10 Essential (primary) hypertension: Secondary | ICD-10-CM | POA: Insufficient documentation

## 2016-12-09 DIAGNOSIS — Z7901 Long term (current) use of anticoagulants: Secondary | ICD-10-CM | POA: Insufficient documentation

## 2016-12-09 DIAGNOSIS — R079 Chest pain, unspecified: Secondary | ICD-10-CM | POA: Insufficient documentation

## 2016-12-09 DIAGNOSIS — Z79899 Other long term (current) drug therapy: Secondary | ICD-10-CM | POA: Insufficient documentation

## 2016-12-09 LAB — URINALYSIS, COMPLETE (UACMP) WITH MICROSCOPIC
BILIRUBIN URINE: NEGATIVE
Bacteria, UA: NONE SEEN
GLUCOSE, UA: NEGATIVE mg/dL
KETONES UR: NEGATIVE mg/dL
LEUKOCYTES UA: NEGATIVE
NITRITE: NEGATIVE
PH: 7 (ref 5.0–8.0)
Protein, ur: 100 mg/dL — AB
SPECIFIC GRAVITY, URINE: 1.006 (ref 1.005–1.030)

## 2016-12-09 LAB — POCT PREGNANCY, URINE
PREG TEST UR: NEGATIVE
Preg Test, Ur: NEGATIVE

## 2016-12-09 LAB — WET PREP, GENITAL
CLUE CELLS WET PREP: NONE SEEN
Sperm: NONE SEEN
TRICH WET PREP: NONE SEEN
Yeast Wet Prep HPF POC: NONE SEEN

## 2016-12-09 LAB — BASIC METABOLIC PANEL
ANION GAP: 6 (ref 5–15)
BUN: <5 mg/dL — ABNORMAL LOW (ref 6–20)
CALCIUM: 8.7 mg/dL — AB (ref 8.9–10.3)
CHLORIDE: 108 mmol/L (ref 101–111)
CO2: 25 mmol/L (ref 22–32)
CREATININE: 0.76 mg/dL (ref 0.44–1.00)
GFR calc Af Amer: >60 mL/min (ref >60)
GFR calc non Af Amer: >60 mL/min (ref >60)
Glucose, Bld: 143 mg/dL — ABNORMAL HIGH (ref 65–99)
Potassium: 3.5 mmol/L (ref 3.5–5.1)
SODIUM: 139 mmol/L (ref 135–145)

## 2016-12-09 LAB — CBC
HCT: 30 % — ABNORMAL LOW (ref 35.0–47.0)
HEMOGLOBIN: 9.1 g/dL — AB (ref 12.0–16.0)
MCH: 21.1 pg — ABNORMAL LOW (ref 26.0–34.0)
MCHC: 30.5 g/dL — ABNORMAL LOW (ref 32.0–36.0)
MCV: 69.1 fL — ABNORMAL LOW (ref 80.0–100.0)
PLATELETS: 416 10*3/uL (ref 150–440)
RBC: 4.34 MIL/uL (ref 3.80–5.20)
RDW: 19 % — ABNORMAL HIGH (ref 11.5–14.5)
WBC: 4.3 10*3/uL (ref 3.6–11.0)

## 2016-12-09 LAB — TROPONIN I

## 2016-12-09 LAB — CHLAMYDIA/NGC RT PCR (ARMC ONLY)
CHLAMYDIA TR: NOT DETECTED
N gonorrhoeae: NOT DETECTED

## 2016-12-09 LAB — FIBRIN DERIVATIVES D-DIMER (ARMC ONLY): FIBRIN DERIVATIVES D-DIMER (ARMC): 378.11 (ref 0.00–499.00)

## 2016-12-09 NOTE — ED Triage Notes (Signed)
Pt ambulatory to triage with steady gait, no distress noted. Pt c/o central intermittent chest pain x1 month that worsens when lying down. Pt reports symptoms unrelieved by OTC gas and heart burn medication. Pt also sts she had a procedure in May where pt had fibroids removed and since has had clear discharge intermittently and want to also be seen for these symptoms as well. Pt denies SOB, N/V.

## 2016-12-09 NOTE — ED Notes (Signed)
ED Provider at bedside with Mickel Baas, RN to perform pelvic exam.

## 2016-12-09 NOTE — ED Provider Notes (Signed)
[facility] Emergency Department Provider Note  ____________________________________________   First MD Initiated Contact with Patient 12/09/16 1941     (approximate)  I have reviewed the triage vital signs and the nursing notes.   HISTORY  Chief Complaint Chest Pain    HPI Kaitlin Brown is a 51 y.o. female with a history of anemia from fibroids as well as hypertension is present to the emergency department today with chest pain over the past month. Says the pain is sharp and to the center of her chest and sometimes radiates to the left side of her chest. Says that it is worse when she lays on her left side. Does not worsen with deep breathing or movement. She denies any nausea or vomiting or shortness of breath. She says that she is also having pain to her bilateral shoulders over the past month but thinks this may be from reaching toher lower back where she says that she thinks she has eczema.  patient also reporting a clear vaginal discharge ever since having a uterine artery embolization this past May. Is not concerned about STDs but would like is examined. Says that she also has cramping to her right lower extremity. Says that she is a history of a blood clot and used to be on anticoagulation up until the time of her embolization. She has not been placed back on anticoagulation since. She said that she came in to the emergency department today finally after a month because of concern for the symptoms persistence. However, she denies any chest pain at this time.   Past Medical History:  Diagnosis Date  . Anemia due to blood loss, chronic    due to menorrhagia  . Back pain   . Bladder infection   . Chicken pox   . Fibroid uterus   . Hypertension     Patient Active Problem List   Diagnosis Date Noted  . Anemia 07/17/2016  . Anemia due to chronic blood loss 07/16/2016  . Acute pulmonary embolism ([place]) 03/17/2016  . DVT (deep venous  thrombosis) ([place]) 03/17/2016  . Fibroid uterus   . Anemia due to blood loss, chronic   . Symptomatic anemia 09/28/2015  . Uterine mass 09/28/2015    Past Surgical History:  Procedure Laterality Date  . EMBOLIZATION Bilateral 07/18/2016   Procedure: Embolization, uterine arteries;  Surgeon: Algernon Huxley, MD;  Location: [place] [facility];  Service: Cardiovascular;  Laterality: Bilateral;  . HYSTEROSCOPY W/D&C  10/2014   [facility], Dr Guadalupe Maple  . none      Prior to Admission medications   Medication Sig Start Date End Date Taking? Authorizing Provider  apixaban (ELIQUIS) 5 MG TABS tablet Take 1 tablet (5 mg total) by mouth 2 (two) times daily. 10mg  BID for 1 week and then 5 mg BID. Patient not taking: Reported on 07/22/2016 03/18/16   Hillary Bow, MD  docusate sodium (COLACE) 100 MG capsule Take 1 capsule (100 mg total) by mouth 2 (two) times daily. 07/20/16   Gae Dry, MD  ferrous gluconate (FERGON) 324 MG tablet Take 1 tablet (324 mg total) by mouth daily with breakfast. 09/29/15   Hower, Aaron Mose, MD  HYDROcodone-acetaminophen (NORCO) 7.5-325 MG tablet Take 1 tablet by mouth every 6 (six) hours as needed for moderate pain or severe pain. 07/20/16   Gae Dry, MD  lisinopril-hydrochlorothiazide (PRINZIDE,ZESTORETIC) 10-12.5 MG per tablet Take 1 tablet by mouth daily.    [provider]  Allergies Patient has no known allergies.  Family History  Problem Relation Age of Onset  . Hypertension Mother   . Diabetes Mother   . Glaucoma Mother   . Heart disease Father        Congestive heart condition  . Clotting disorder Maternal Grandmother     Social History Social History  Substance Use Topics  . Smoking status: Never Smoker  . Smokeless tobacco: Never Used  . Alcohol use Yes     Comment: occasionally    Review of Systems  Constitutional: No fever/chills Eyes: No visual changes. ENT: No sore throat. Cardiovascular: as above Respiratory: Denies shortness of  breath. Gastrointestinal: No abdominal pain.  No nausea, no vomiting.  No diarrhea.  No constipation. Genitourinary: Negative for dysuria. Musculoskeletal: as above Skin: as above Neurological: Negative for headaches, focal weakness or numbness.   ____________________________________________   PHYSICAL EXAM:  VITAL SIGNS: ED Triage Vitals [12/09/16 1904]  Enc Vitals Group     BP (!) 168/90     Pulse Rate (!) 103     Resp 17     Temp 98 F (36.7 C)     Temp Source Oral     SpO2 99 %     Weight 219 lb (99.3 kg)     Height      Head Circumference      Peak Flow      Pain Score      Pain Loc      Pain Edu?      Excl. in [place]?     Constitutional: Alert and oriented. Well appearing and in no acute distress. Eyes: Conjunctivae are normal.  Head: Atraumatic. Nose: No congestion/rhinnorhea. Mouth/Throat: Mucous membranes are moist.  Neck: No stridor.   Cardiovascular: tachycardic, regular rhythm. Grossly normal heart sounds. heart rate in the room is 113 bpm. chest pain is not reproducible palpation. Respiratory: Normal respiratory effort.  No retractions. Lungs CTAB. Gastrointestinal: Soft and nontender. No distention. No CVA tenderness. Genitourinary:  no lesions externally. Speculum exam with very minimal clear discharge. No CMT, uterine tenderness nor adnexal tenderness nor masses. Musculoskeletal: No lower extremity tenderness nor edema.  No joint effusions. Neurologic:  Normal speech and language. No gross focal neurologic deficits are appreciated. Skin:  Skin is warm, dry and intact. No rash noted. Psychiatric: Mood and affect are normal. Speech and behavior are normal.  ____________________________________________   LABS (all labs ordered are listed, but only abnormal results are displayed)  Labs Reviewed  WET PREP, GENITAL - Abnormal; Notable for the following:       Result Value   WBC, Wet Prep HPF POC FEW (*)    All other components within normal limits    BASIC METABOLIC PANEL - Abnormal; Notable for the following:    Glucose, Bld 143 (*)    BUN <5 (*)    Calcium 8.7 (*)    All other components within normal limits  CBC - Abnormal; Notable for the following:    Hemoglobin 9.1 (*)    HCT 30.0 (*)    MCV 69.1 (*)    MCH 21.1 (*)    MCHC 30.5 (*)    RDW 19.0 (*)    All other components within normal limits  URINALYSIS, COMPLETE (UACMP) WITH MICROSCOPIC - Abnormal; Notable for the following:    Color, Urine YELLOW (*)    APPearance HAZY (*)    Hgb urine dipstick LARGE (*)    Protein, ur 100 (*)  Squamous Epithelial / LPF 6-30 (*)    All other components within normal limits  CHLAMYDIA/NGC RT PCR ([facility] ONLY)  TROPONIN I  FIBRIN DERIVATIVES D-DIMER ([facility] ONLY)  POC URINE PREG, ED  POCT PREGNANCY, URINE  POCT PREGNANCY, URINE   ____________________________________________  EKG  ED ECG REPORT I, Tonja Jezewski,  Youlanda Roys, the attending physician, personally viewed and interpreted this ECG.   Date: 12/09/2016  EKG Time: 1903  Rate: 111  Rhythm: sinus tachycardia  Axis: normal  Intervals:none  ST&T Change: no ST segment elevation or depression. No abnormal T-wave inversion.  ____________________________________________  RADIOLOGY  no acute finding on the chest x-ray ____________________________________________   PROCEDURES  Procedure(s) performed:   Procedures  Critical Care performed:   ____________________________________________   INITIAL IMPRESSION / ASSESSMENT AND PLAN / ED COURSE  Pertinent labs & imaging results that were available during my care of the patient were reviewed by me and considered in my medical decision making (see chart for details).  ----------------------------------------- 8:39 PM on 12/09/2016 -----------------------------------------  Patient with a heart rate now 100 bpm. Very reassuring workup with negative pregnancy test. She went blood cells on the wet prep. No signs  of UTI. Negative d-dimer. Possible musculoskeletal pain. Patient says that she believes her heart race when she gets nervous she gets very anxious at the hospital. Likely tachycardia related to this. She says her discharge has been on and off. She'll follow-up with her OB/GYN at [facility]. We'll also follow up with her primary care doctor [facility] health care. She will try icy hot DD aching body areas. She is understanding the plan and willing to comply. Will be discharged home.      ____________________________________________   FINAL CLINICAL IMPRESSION(S) / ED DIAGNOSES  chest pain. Myalgia. Vaginal discharge.    NEW MEDICATIONS STARTED DURING THIS VISIT:  New Prescriptions   No medications on file     Note:  This document was prepared using Dragon voice recognition software and may include unintentional dictation errors.     Orbie Pyo, MD 12/09/16 2040

## 2017-03-23 ENCOUNTER — Emergency Department: Payer: Self-pay

## 2017-03-23 ENCOUNTER — Other Ambulatory Visit: Payer: Self-pay

## 2017-03-23 ENCOUNTER — Encounter: Payer: Self-pay | Admitting: Emergency Medicine

## 2017-03-23 ENCOUNTER — Emergency Department
Admission: EM | Admit: 2017-03-23 | Discharge: 2017-03-23 | Disposition: A | Discharge status: Home / Self Care | Payer: Self-pay | Attending: Emergency Medicine | Admitting: Emergency Medicine

## 2017-03-23 DIAGNOSIS — M25512 Pain in left shoulder: Secondary | ICD-10-CM | POA: Insufficient documentation

## 2017-03-23 DIAGNOSIS — G8929 Other chronic pain: Secondary | ICD-10-CM | POA: Insufficient documentation

## 2017-03-23 DIAGNOSIS — I10 Essential (primary) hypertension: Secondary | ICD-10-CM | POA: Insufficient documentation

## 2017-03-23 DIAGNOSIS — M19012 Primary osteoarthritis, left shoulder: Secondary | ICD-10-CM | POA: Insufficient documentation

## 2017-03-23 DIAGNOSIS — Z79899 Other long term (current) drug therapy: Secondary | ICD-10-CM | POA: Insufficient documentation

## 2017-03-23 MED ORDER — CYCLOBENZAPRINE HCL 5 MG PO TABS: ORAL_TABLET | ORAL | 0 refills | Status: DC

## 2017-03-23 MED ORDER — PREDNISONE 10 MG PO TABS: 40.0000 mg | ORAL_TABLET | Freq: Every day | ORAL | 0 refills | Status: AC

## 2017-03-23 NOTE — ED Notes (Signed)
See triage note  Presents with a several month hx of pain to left shoulder  Pain increases with movement  No injury  No deformity

## 2017-03-23 NOTE — ED Triage Notes (Signed)
L shoulder pain with movement x several months. Denies injury at onset.

## 2017-03-23 NOTE — ED Provider Notes (Signed)
[facility] Emergency Department Provider Note  ____________________________________________  Time seen: Approximately 4:28 PM  I have reviewed the triage vital signs and the nursing notes.   HISTORY  Chief Complaint Shoulder Pain    HPI Kaitlin Brown is a 52 y.o. female that presents to the emergency department for evaluation of left shoulder pain for several months. Pain is primarily over the front of her upper arm.  She feels like it is in the muscle.  Pain is worse with lifting her arm.  Before symptoms started, she can remember lifting 5 pound weights everyday for the month prior.  She has been meaning to talk to her primary care provider about this several times but she always has something more important going on.  Pain has not changed in character for months. She has been taking Advil for pain, which has not helped.  No shortness breath, chest pain, nausea, vomiting, abdominal pain, numbness, tingling.    Past Medical History:  Diagnosis Date  . Anemia due to blood loss, chronic    due to menorrhagia  . Back pain   . Bladder infection   . Chicken pox   . Fibroid uterus   . Hypertension     Patient Active Problem List   Diagnosis Date Noted  . Anemia 07/17/2016  . Anemia due to chronic blood loss 07/16/2016  . Acute pulmonary embolism ([place]) 03/17/2016  . DVT (deep venous thrombosis) ([place]) 03/17/2016  . Fibroid uterus   . Anemia due to blood loss, chronic   . Symptomatic anemia 09/28/2015  . Uterine mass 09/28/2015    Past Surgical History:  Procedure Laterality Date  . EMBOLIZATION Bilateral 07/18/2016   Procedure: Embolization, uterine arteries;  Surgeon: Algernon Huxley, MD;  Location: [place] [facility];  Service: Cardiovascular;  Laterality: Bilateral;  . HYSTEROSCOPY W/D&C  10/2014   [facility], Dr Guadalupe Maple  . none      Prior to Admission medications   Medication Sig Start Date End Date Taking? Authorizing Provider  apixaban  (ELIQUIS) 5 MG TABS tablet Take 1 tablet (5 mg total) by mouth 2 (two) times daily. 10mg  BID for 1 week and then 5 mg BID. Patient not taking: Reported on 07/22/2016 03/18/16   Hillary Bow, MD  cyclobenzaprine (FLEXERIL) 5 MG tablet Take 1-2 tablets 3 times daily as needed 03/23/17   Laban Emperor, PA-C  docusate sodium (COLACE) 100 MG capsule Take 1 capsule (100 mg total) by mouth 2 (two) times daily. 07/20/16   Gae Dry, MD  ferrous gluconate (FERGON) 324 MG tablet Take 1 tablet (324 mg total) by mouth daily with breakfast. 09/29/15   Hower, Aaron Mose, MD  HYDROcodone-acetaminophen (NORCO) 7.5-325 MG tablet Take 1 tablet by mouth every 6 (six) hours as needed for moderate pain or severe pain. 07/20/16   Gae Dry, MD  lisinopril-hydrochlorothiazide (PRINZIDE,ZESTORETIC) 10-12.5 MG per tablet Take 1 tablet by mouth daily.    [provider]  predniSONE (DELTASONE) 10 MG tablet Take 4 tablets (40 mg total) by mouth daily for 5 days. 03/23/17 03/28/17  Laban Emperor, PA-C    Allergies Patient has no known allergies.  Family History  Problem Relation Age of Onset  . Hypertension Mother   . Diabetes Mother   . Glaucoma Mother   . Heart disease Father        Congestive heart condition  . Clotting disorder Maternal Grandmother     Social History Social History   Tobacco Use  .  Smoking status: Never Smoker  . Smokeless tobacco: Never Used  Substance Use Topics  . Alcohol use: Yes    Comment: occasionally  . Drug use: No     Review of Systems  Cardiovascular: No chest pain. Respiratory: No SOB. Gastrointestinal: No abdominal pain.  No nausea, no vomiting.  Musculoskeletal: Positive for arm pain. Skin: Negative for rash, abrasions, lacerations, ecchymosis. Neurological: Negative for  numbness or tingling   ____________________________________________   PHYSICAL EXAM:  VITAL SIGNS: ED Triage Vitals  Enc Vitals Group     BP 03/23/17 1516 (!) 177/105     Pulse  Rate 03/23/17 1516 91     Resp 03/23/17 1516 18     Temp 03/23/17 1516 98.9 F (37.2 C)     Temp Source 03/23/17 1516 Oral     SpO2 03/23/17 1516 95 %     Weight 03/23/17 1517 220 lb (99.8 kg)     Height 03/23/17 1517 5\' 6"  (1.676 m)     Head Circumference --      Peak Flow --      Pain Score 03/23/17 1516 10     Pain Loc --      Pain Edu? --      Excl. in [place]? --      Constitutional: Alert and oriented. Well appearing and in no acute distress. Eyes: Conjunctivae are normal. PERRL. EOMI. Head: Atraumatic. ENT:      Ears:      Nose: No congestion/rhinnorhea.      Mouth/Throat: Mucous membranes are moist.  Neck: No stridor. No cervical spine tenderness to palpation. Cardiovascular: Normal rate, regular rhythm.  Good peripheral circulation.  Symmetric radial pulses bilaterally. Respiratory: Normal respiratory effort without tachypnea or retractions. Lungs CTAB. Good air entry to the bases with no decreased or absent breath sounds. Musculoskeletal: Full range of motion to all extremities. No gross deformities appreciated.  Tenderness to palpation diffusely over left biceps.  Full ROM of shoulder. Strength 5 out of 5 in upper extremities bilaterally.  No erythema. Neurologic:  Normal speech and language. No gross focal neurologic deficits are appreciated.  Skin:  Skin is warm, dry and intact. No rash noted.   ____________________________________________   LABS (all labs ordered are listed, but only abnormal results are displayed)  Labs Reviewed - No data to display ____________________________________________  EKG   ____________________________________________  RADIOLOGY Robinette Haines, personally viewed and evaluated these images (plain radiographs) as part of my medical decision making, as well as reviewing the written report by the radiologist.  Dg Shoulder Left  Result Date: 03/23/2017 CLINICAL DATA:  Left shoulder pain for months. Increasing pain with movements. No  acute injury. EXAM: LEFT SHOULDER - 2+ VIEW COMPARISON:  None. FINDINGS: The mineralization and alignment are normal. There is no evidence of acute fracture or dislocation. The subacromial space is preserved. There are mild acromioclavicular degenerative changes. IMPRESSION: No acute findings.  Mild acromioclavicular degenerative changes. Electronically Signed   By: Richardean Sale M.D.   On: 03/23/2017 16:36    ____________________________________________    PROCEDURES  Procedure(s) performed:    Procedures    Medications - No data to display   ____________________________________________   INITIAL IMPRESSION / ASSESSMENT AND PLAN / ED COURSE  Pertinent labs & imaging results that were available during my care of the patient were reviewed by me and considered in my medical decision making (see chart for details).  Review of the [place] CSRS was performed in accordance of the [facility]  prior to dispensing any controlled drugs.   Patient presents emergency department for evaluation of chronic shoulder and upper arm pain. Vital signs and exam are reassuring. Xray consistent with degenerative changes. Pain is likely musculoskeletal. Patient will be discharged home with prescriptions for flexeril and prednisone for muscle spasms and inflammation. Patient is to follow up with PCP as directed. Patient is given ED precautions to return to the ED for any worsening or new symptoms.     ____________________________________________  FINAL CLINICAL IMPRESSION(S) / ED DIAGNOSES  Final diagnoses:  Chronic left shoulder pain  Osteoarthritis of left shoulder, unspecified osteoarthritis type      NEW MEDICATIONS STARTED DURING THIS VISIT:  ED Discharge Orders        Ordered    cyclobenzaprine (FLEXERIL) 5 MG tablet     03/23/17 1710    predniSONE (DELTASONE) 10 MG tablet  Daily     03/23/17 1710          This chart was dictated using voice recognition software/Dragon. Despite best  efforts to proofread, errors can occur which can change the meaning. Any change was purely unintentional.    Laban Emperor, PA-C 03/23/17 1844    Lavonia Drafts, MD 03/23/17 [phone]

## 2017-03-26 ENCOUNTER — Emergency Department
Admission: EM | Admit: 2017-03-26 | Discharge: 2017-03-26 | Disposition: A | Discharge status: Home / Self Care | Payer: Self-pay | Attending: Emergency Medicine | Admitting: Emergency Medicine

## 2017-03-26 ENCOUNTER — Emergency Department: Payer: Self-pay

## 2017-03-26 ENCOUNTER — Encounter: Payer: Self-pay | Admitting: Emergency Medicine

## 2017-03-26 DIAGNOSIS — Z79899 Other long term (current) drug therapy: Secondary | ICD-10-CM | POA: Insufficient documentation

## 2017-03-26 DIAGNOSIS — J101 Influenza due to other identified influenza virus with other respiratory manifestations: Secondary | ICD-10-CM | POA: Insufficient documentation

## 2017-03-26 DIAGNOSIS — Z7901 Long term (current) use of anticoagulants: Secondary | ICD-10-CM | POA: Insufficient documentation

## 2017-03-26 DIAGNOSIS — I10 Essential (primary) hypertension: Secondary | ICD-10-CM | POA: Insufficient documentation

## 2017-03-26 LAB — INFLUENZA PANEL BY PCR (TYPE A & B)
Influenza A By PCR: POSITIVE — AB
Influenza B By PCR: NEGATIVE

## 2017-03-26 LAB — GROUP A STREP BY PCR: Group A Strep by PCR: NOT DETECTED

## 2017-03-26 MED ORDER — OSELTAMIVIR PHOSPHATE 75 MG PO CAPS
75.0000 mg | ORAL_CAPSULE | Freq: Once | ORAL | Status: AC
  Administered 2017-03-26: 75 mg via ORAL
  Filled 2017-03-26: qty 1

## 2017-03-26 MED ORDER — OSELTAMIVIR PHOSPHATE 75 MG PO CAPS: 75.0000 mg | ORAL_CAPSULE | Freq: Two times a day (BID) | ORAL | 0 refills | Status: AC

## 2017-03-26 MED ORDER — IBUPROFEN 600 MG PO TABS
600.0000 mg | ORAL_TABLET | Freq: Once | ORAL | Status: AC
  Administered 2017-03-26: 600 mg via ORAL
  Filled 2017-03-26: qty 1

## 2017-03-26 MED ORDER — ACETAMINOPHEN 500 MG PO TABS: 1000.0000 mg | ORAL_TABLET | Freq: Once | ORAL | Status: DC

## 2017-03-26 NOTE — ED Triage Notes (Signed)
Pt c/o cough, nasal congestion and sore throat x2 days. Pt denies SOB, N/V/D. Pt seen on Monday for body aches.

## 2017-03-26 NOTE — ED Triage Notes (Signed)
Patient to ER for c/o sore throat, body aches, HA x2 days. States she is unsure if she has had fever or not.

## 2017-04-08 NOTE — ED Provider Notes (Signed)
[facility] Emergency Department Provider Note    I have reviewed the triage vital signs and the nursing notes.   HISTORY  Chief Complaint Sore Throat and Cough    HPI Kaitlin Brown is a 52 y.o. female with below list of chronic medical conditions presents to the emergency department with 2-day history of cough congestion sore throat.  Patient mid to subjective fevers.  Of note patient was seen in the emergency department 03/23/2017 with an unrelated issue.  Patient noted to be febrile on presentation with a temperature of 102.8.  Patient denies any known sick contact.  Past Medical History:  Diagnosis Date  . Anemia due to blood loss, chronic    due to menorrhagia  . Back pain   . Bladder infection   . Chicken pox   . Fibroid uterus   . Hypertension     Patient Active Problem List   Diagnosis Date Noted  . Anemia 07/17/2016  . Anemia due to chronic blood loss 07/16/2016  . Acute pulmonary embolism ([place]) 03/17/2016  . DVT (deep venous thrombosis) ([place]) 03/17/2016  . Fibroid uterus   . Anemia due to blood loss, chronic   . Symptomatic anemia 09/28/2015  . Uterine mass 09/28/2015    Past Surgical History:  Procedure Laterality Date  . EMBOLIZATION Bilateral 07/18/2016   Procedure: Embolization, uterine arteries;  Surgeon: Algernon Huxley, MD;  Location: [place] [facility];  Service: Cardiovascular;  Laterality: Bilateral;  . HYSTEROSCOPY W/D&C  10/2014   [facility], Dr Guadalupe Maple  . none      Prior to Admission medications   Medication Sig Start Date End Date Taking? Authorizing Provider  apixaban (ELIQUIS) 5 MG TABS tablet Take 1 tablet (5 mg total) by mouth 2 (two) times daily. 10mg  BID for 1 week and then 5 mg BID. Patient not taking: Reported on 07/22/2016 03/18/16   Hillary Bow, MD  cyclobenzaprine (FLEXERIL) 5 MG tablet Take 1-2 tablets 3 times daily as needed 03/23/17   Laban Emperor, PA-C  docusate sodium (COLACE) 100 MG capsule Take 1  capsule (100 mg total) by mouth 2 (two) times daily. 07/20/16   Gae Dry, MD  ferrous gluconate (FERGON) 324 MG tablet Take 1 tablet (324 mg total) by mouth daily with breakfast. 09/29/15   Hower, Aaron Mose, MD  HYDROcodone-acetaminophen (NORCO) 7.5-325 MG tablet Take 1 tablet by mouth every 6 (six) hours as needed for moderate pain or severe pain. 07/20/16   Gae Dry, MD  lisinopril-hydrochlorothiazide (PRINZIDE,ZESTORETIC) 10-12.5 MG per tablet Take 1 tablet by mouth daily.    [provider]    Allergies Patient has no known allergies.  Family History  Problem Relation Age of Onset  . Hypertension Mother   . Diabetes Mother   . Glaucoma Mother   . Heart disease Father        Congestive heart condition  . Clotting disorder Maternal Grandmother     Social History Social History   Tobacco Use  . Smoking status: Never Smoker  . Smokeless tobacco: Never Used  Substance Use Topics  . Alcohol use: Yes    Comment: occasionally  . Drug use: No    Review of Systems Constitutional: Positive for fever/chills Eyes: No visual changes. ENT: .  Positive for nasal congestion and sore throat Cardiovascular: Denies chest pain. Respiratory: Denies shortness of breath.  Positive for cough Gastrointestinal: No abdominal pain.  No nausea, no vomiting.  No diarrhea.  No constipation. Genitourinary:  Negative for dysuria. Musculoskeletal: Negative for neck pain.  Negative for back pain. Integumentary: Negative for rash. Neurological: Negative for headaches, focal weakness or numbness.   ____________________________________________   PHYSICAL EXAM:  VITAL SIGNS: ED Triage Vitals  Enc Vitals Group     BP 03/26/17 0341 (!) 158/95     Pulse Rate 03/26/17 0341 (!) 130     Resp 03/26/17 0341 20     Temp 03/26/17 0341 (!) 102.8 F (39.3 C)     Temp Source 03/26/17 0341 Oral     SpO2 03/26/17 0341 96 %     Weight 03/26/17 0325 99.8 kg (220 lb)     Height --      Head  Circumference --      Peak Flow --      Pain Score 03/26/17 0432 7     Pain Loc --      Pain Edu? --      Excl. in [place]? --     Constitutional: Alert and oriented. Well appearing and in no acute distress. Eyes: Conjunctivae are normal.  Head: Atraumatic. Nose: No congestion/rhinnorhea. Mouth/Throat: Mucous membranes are moist. Oropharynx non-erythematous. Neck: No stridor.  No meningeal signs.   Cardiovascular: Normal rate, regular rhythm. Good peripheral circulation. Grossly normal heart sounds. Respiratory: Normal respiratory effort.  No retractions. Lungs CTAB. Gastrointestinal: Soft and nontender. No distention.  Musculoskeletal: No lower extremity tenderness nor edema. No gross deformities of extremities. Neurologic:  Normal speech and language. No gross focal neurologic deficits are appreciated.  Skin:  Skin is warm, dry and intact. No rash noted. Psychiatric: Mood and affect are normal. Speech and behavior are normal.  ____________________________________________   LABS (all labs ordered are listed, but only abnormal results are displayed)  Labs Reviewed  INFLUENZA PANEL BY PCR (TYPE A & B) - Abnormal; Notable for the following components:      Result Value   Influenza A By PCR POSITIVE (*)    All other components within normal limits  GROUP A STREP BY PCR     ____________________________________________   INITIAL IMPRESSION / ASSESSMENT AND PLAN / ED COURSE  As part of my medical decision making, I reviewed the following data within the electronic MEDICAL RECORD [id number] year old female present with above-stated history and physical exam concerning for possible influenza versus pneumonia chest x-ray revealed no evidence of pneumonia influenza PCR swab positive for influenza A.  Patient given Tamiflu in the emergency department will be prescribed the same for home FINAL CLINICAL IMPRESSION(S) / ED DIAGNOSES  Final diagnoses:  Influenza A     MEDICATIONS GIVEN DURING  THIS VISIT:  Medications  ibuprofen (ADVIL,MOTRIN) tablet 600 mg (600 mg Oral Given 03/26/17 0424)  oseltamivir (TAMIFLU) capsule 75 mg (75 mg Oral Given 03/26/17 0607)     ED Discharge Orders        Ordered    oseltamivir (TAMIFLU) 75 MG capsule  2 times daily     03/26/17 0548       Note:  This document was prepared using Dragon voice recognition software and may include unintentional dictation errors.    Gregor Hams, MD 04/08/17 [phone]

## 2017-09-17 ENCOUNTER — Emergency Department: Payer: Self-pay

## 2017-09-17 ENCOUNTER — Encounter: Payer: Self-pay | Admitting: Emergency Medicine

## 2017-09-17 ENCOUNTER — Other Ambulatory Visit: Payer: Self-pay

## 2017-09-17 ENCOUNTER — Emergency Department
Admission: EM | Admit: 2017-09-17 | Discharge: 2017-09-17 | Disposition: A | Discharge status: Home / Self Care | Payer: Self-pay | Attending: Emergency Medicine | Admitting: Emergency Medicine

## 2017-09-17 DIAGNOSIS — Z79899 Other long term (current) drug therapy: Secondary | ICD-10-CM | POA: Insufficient documentation

## 2017-09-17 DIAGNOSIS — I471 Supraventricular tachycardia: Secondary | ICD-10-CM | POA: Insufficient documentation

## 2017-09-17 DIAGNOSIS — I10 Essential (primary) hypertension: Secondary | ICD-10-CM | POA: Insufficient documentation

## 2017-09-17 LAB — CBC WITH DIFFERENTIAL/PLATELET
Basophils Absolute: 0 10*3/uL (ref 0–0.1)
Basophils Relative: 1 %
Eosinophils Absolute: 0.1 10*3/uL (ref 0–0.7)
Eosinophils Relative: 2 %
HEMATOCRIT: 51.7 % — AB (ref 35.0–47.0)
HEMOGLOBIN: 17.2 g/dL — AB (ref 12.0–16.0)
LYMPHS ABS: 2 10*3/uL (ref 1.0–3.6)
LYMPHS PCT: 31 %
MCH: 28 pg (ref 26.0–34.0)
MCHC: 33.2 g/dL (ref 32.0–36.0)
MCV: 84.5 fL (ref 80.0–100.0)
Monocytes Absolute: 0.5 10*3/uL (ref 0.2–0.9)
Monocytes Relative: 8 %
NEUTROS ABS: 3.8 10*3/uL (ref 1.4–6.5)
NEUTROS PCT: 58 %
Platelets: 291 10*3/uL (ref 150–440)
RBC: 6.12 MIL/uL — AB (ref 3.80–5.20)
RDW: 14.1 % (ref 11.5–14.5)
WBC: 6.4 10*3/uL (ref 3.6–11.0)

## 2017-09-17 LAB — BASIC METABOLIC PANEL
ANION GAP: 10 (ref 5–15)
BUN: 10 mg/dL (ref 6–20)
CO2: 22 mmol/L (ref 22–32)
Calcium: 9.2 mg/dL (ref 8.9–10.3)
Chloride: 105 mmol/L (ref 98–111)
Creatinine, Ser: 0.78 mg/dL (ref 0.44–1.00)
GFR calc Af Amer: >60 mL/min (ref >60)
GFR calc non Af Amer: >60 mL/min (ref >60)
Glucose, Bld: 156 mg/dL — ABNORMAL HIGH (ref 70–99)
Potassium: 4 mmol/L (ref 3.5–5.1)
Sodium: 137 mmol/L (ref 135–145)

## 2017-09-17 LAB — TROPONIN I: Troponin I: <0.03 ng/mL (ref <0.03)

## 2017-09-17 MED ORDER — METOPROLOL TARTRATE 5 MG/5ML IV SOLN
INTRAVENOUS | Status: AC
  Administered 2017-09-17: 5 mg
  Filled 2017-09-17: qty 5

## 2017-09-17 MED ORDER — FAMOTIDINE 20 MG PO TABS
40.0000 mg | ORAL_TABLET | Freq: Once | ORAL | Status: AC
  Administered 2017-09-17: 40 mg via ORAL

## 2017-09-17 MED ORDER — SODIUM CHLORIDE 0.9 % IV BOLUS
1000.0000 mL | Freq: Once | INTRAVENOUS | Status: AC
Start: 2017-09-17 — End: 2017-09-17
  Administered 2017-09-17: 1000 mL via INTRAVENOUS

## 2017-09-17 MED ORDER — FAMOTIDINE 20 MG PO TABS
ORAL_TABLET | ORAL | Status: AC
  Filled 2017-09-17: qty 2

## 2017-09-17 NOTE — ED Notes (Signed)
Pt ambulatory to POV without difficulty. VSS. NAD. Discharge instructions, RX and follow up reviewed. All questions answered.

## 2017-09-17 NOTE — Discharge Instructions (Addendum)
Return to the ER for new, worsening, or persistent palpitations, difficulty breathing, weakness or lightheadedness, chest pain, or any other new or worsening symptoms that concern you.  You should follow-up with a cardiologist within the next few weeks, and we have provided a referral.  Take your regular medications as prescribed and follow-up with your regular doctor as well.

## 2017-09-17 NOTE — ED Triage Notes (Signed)
Pt reports that she is having Palpitations, and SOB, she states that this has happened before, when she drank. She states that she had 4 shots last night and woke up this am with her heart racing.

## 2017-09-17 NOTE — ED Provider Notes (Signed)
[facility] Emergency Department Provider Note ____________________________________________   First MD Initiated Contact with Patient 09/17/17 1048     (approximate)  I have reviewed the triage vital signs and the nursing notes.   HISTORY  Chief Complaint Palpitations and Shortness of Breath    HPI Kaitlin Brown is a 52 y.o. female with PMH as noted below who presents with palpitations, acute onset several hours ago this morning, associated initially with lightheadedness which has now resolved, and associated with some shortness of breath.  The patient denies chest pain.  She states that she had 4 shots of alcohol last night and does not normally drink.  She denies drug use.  She states that this happened to her once previously the morning after drinking alcohol.  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  She has been off of anticoagulation for several months, but she denies any acute leg swelling or pain.  She states she was in her usual state of health yesterday.  Past Medical History:  Diagnosis Date  . Anemia due to blood loss, chronic    due to menorrhagia  . Back pain   . Bladder infection   . Chicken pox   . Fibroid uterus   . Hypertension     Patient Active Problem List   Diagnosis Date Noted  . Anemia 07/17/2016  . Anemia due to chronic blood loss 07/16/2016  . Acute pulmonary embolism ([place]) 03/17/2016  . DVT (deep venous thrombosis) ([place]) 03/17/2016  . Fibroid uterus   . Anemia due to blood loss, chronic   . Symptomatic anemia 09/28/2015  . Uterine mass 09/28/2015    Past Surgical History:  Procedure Laterality Date  . EMBOLIZATION Bilateral 07/18/2016   Procedure: Embolization, uterine arteries;  Surgeon: Algernon Huxley, MD;  Location: [place] [facility];  Service: Cardiovascular;  Laterality: Bilateral;  . HYSTEROSCOPY W/D&C  10/2014   [facility], Dr Guadalupe Maple  . none      Prior to  Admission medications   Medication Sig Start Date End Date Taking? Authorizing Provider  apixaban (ELIQUIS) 5 MG TABS tablet Take 1 tablet (5 mg total) by mouth 2 (two) times daily. 10mg  BID for 1 week and then 5 mg BID. Patient not taking: Reported on 07/22/2016 03/18/16   Hillary Bow, MD  cyclobenzaprine (FLEXERIL) 5 MG tablet Take 1-2 tablets 3 times daily as needed 03/23/17   Laban Emperor, PA-C  docusate sodium (COLACE) 100 MG capsule Take 1 capsule (100 mg total) by mouth 2 (two) times daily. 07/20/16   Gae Dry, MD  ferrous gluconate (FERGON) 324 MG tablet Take 1 tablet (324 mg total) by mouth daily with breakfast. 09/29/15   Hower, Aaron Mose, MD  HYDROcodone-acetaminophen (NORCO) 7.5-325 MG tablet Take 1 tablet by mouth every 6 (six) hours as needed for moderate pain or severe pain. 07/20/16   Gae Dry, MD  lisinopril-hydrochlorothiazide (PRINZIDE,ZESTORETIC) 10-12.5 MG per tablet Take 1 tablet by mouth daily.    [provider]    Allergies Patient has no known allergies.  Family History  Problem Relation Age of Onset  . Hypertension Mother   . Diabetes Mother   . Glaucoma Mother   . Heart disease Father        Congestive heart condition  . Clotting disorder Maternal Grandmother     Social History Social History   Tobacco Use  . Smoking status: Never Smoker  .  Smokeless tobacco: Never Used  Substance Use Topics  . Alcohol use: Yes    Comment: occasionally  . Drug use: No    Review of Systems  Constitutional: No fever. Eyes: No redness. ENT: No sore throat. Cardiovascular: Denies chest pain.  Positive for palpitations. Respiratory: Positive for shortness of breath. Gastrointestinal: No vomiting.  Genitourinary: Negative for dysuria.  Musculoskeletal: Negative for back pain. Skin: Negative for rash. Neurological: Negative for headache.   ____________________________________________   PHYSICAL EXAM:  VITAL SIGNS: ED Triage Vitals [09/17/17  1042]  Enc Vitals Group     BP 119/84     Pulse      Resp      Temp      Temp src      SpO2      Weight      Height      Head Circumference      Peak Flow      Pain Score      Pain Loc      Pain Edu?      Excl. in [place]?     Constitutional: Alert and oriented.  Relatively well appearing and in no acute distress. Eyes: Conjunctivae are normal.  Head: Atraumatic. Nose: No congestion/rhinnorhea. Mouth/Throat: Mucous membranes are moist.   Neck: Normal range of motion.  Cardiovascular: Tachycardic, regular rhythm. Grossly normal heart sounds.  Good peripheral circulation. Respiratory: Normal respiratory effort.  No retractions. Lungs CTAB. Gastrointestinal: No distention.  Musculoskeletal: No lower extremity edema.  No calf or popliteal swelling or tenderness.  Extremities warm and well perfused.  Neurologic:  Normal speech and language. No gross focal neurologic deficits are appreciated.  Skin:  Skin is warm and dry. No rash noted. Psychiatric: Mood and affect are normal. Speech and behavior are normal.  ____________________________________________   LABS (all labs ordered are listed, but only abnormal results are displayed)  Labs Reviewed  BASIC METABOLIC PANEL - Abnormal; Notable for the following components:      Result Value   Glucose, Bld 156 (*)    All other components within normal limits  CBC WITH DIFFERENTIAL/PLATELET - Abnormal; Notable for the following components:   RBC 6.12 (*)    Hemoglobin 17.2 (*)    HCT 51.7 (*)    All other components within normal limits  TROPONIN I   ____________________________________________  EKG  ED ECG REPORT I, Arta Silence, the attending physician, personally viewed and interpreted this ECG.  Date: 09/17/2017 EKG Time: 1043 Rate: 195 Rhythm: Narrow complex tachycardia QRS Axis: normal Intervals: normal ST/T Wave abnormalities: normal Narrative Interpretation: no evidence of acute ischemia  ED ECG REPORT I,  Arta Silence, the attending physician, personally viewed and interpreted this ECG.  Date: 09/17/2017 EKG Time: 1104 Rate: 101 Rhythm: normal sinus rhythm QRS Axis: normal Intervals: normal ST/T Wave abnormalities: normal Narrative Interpretation: no evidence of acute ischemia   ____________________________________________  RADIOLOGY  CXR: No focal infiltrate or other acute findings  ____________________________________________   PROCEDURES  Procedure(s) performed: No  Procedures  Critical Care performed: Yes  CRITICAL CARE Performed by: Arta Silence   Total critical care time: 30 minutes  Critical care time was exclusive of separately billable procedures and treating other patients.  Critical care was necessary to treat or prevent imminent or life-threatening deterioration.  Critical care was time spent personally by me on the following activities: development of treatment plan with patient and/or surrogate as well as nursing, discussions with consultants, evaluation of patient's response to treatment, examination  of patient, obtaining history from patient or surrogate, ordering and performing treatments and interventions, ordering and review of laboratory studies, ordering and review of radiographic studies, pulse oximetry and re-evaluation of patient's condition. ____________________________________________   INITIAL IMPRESSION / ASSESSMENT AND PLAN / ED COURSE  Pertinent labs & imaging results that were available during my care of the patient were reviewed by me and considered in my medical decision making (see chart for details).  52 year old female with PMH as noted above presents with palpitations and shortness of breath since this morning, with some lightheadedness which has resolved.  This occurred after drinking alcohol last night.  She reports a similar episode of high heart rate and palpitations once before several years ago after drinking  alcohol.  I reviewed the past medical records in Epic; the patient has a history of DVT and PE formerly on Eliquis.  The patient had a uterine artery embolization earlier this year and has been off of anticoagulation since.  On exam, the patient is actually relatively well-appearing.  Her heart rate is around 200 and fairly regular, and the other vital signs are normal.  The remainder of the exam is as described above.  EKG shows a narrow complex tachycardia, SVT versus A. fib.  Overall I suspect most likely A. fib given the alcohol use in the lack of prior history of unprovoked SVT episodes.  We will give IV metoprolol, obtain basic and cardiac labs, chest x-ray, give IV fluids, and reassess.  I have a very low suspicion for acute PE given the lack of recent DVT symptoms and the lack of chest pain.  If patient's symptoms resolve once her heart rate improves, this will make PE extremely unlikely.  If patient does has persistent shortness of breath or other concerning findings, we will consider CT chest.  Anticipate discharge home if tachycardia and symptoms resolve and the patient remains stable for several hours.  ----------------------------------------- 2:19 PM on 09/17/2017 -----------------------------------------  Patient returned to normal sinus rhythm around 100 bpm after the metoprolol.  Her heart rate is now in the 90s.  The patient has remained asymptomatic since the initial improvement in her heart rate, and currently has no chest pain or difficulty breathing.  Given the complete resolution of her symptoms and normalization of her vital signs, there is no evidence of PE since this would not resolve spontaneously.  Therefore there is no indication for further work-up.  As the patient has now remained stable and asymptomatic for more than 2 hours, she is appropriate for discharge home.  She feels well and would like to go home.  I now suspect most likely SVT rather than A. fib given that  she has no evidence of cardiomyopathy.  I counseled her on the results of the work-up.  We have provided cardiology referral for follow-up, as well as return precautions; the patient demonstrated understanding. ____________________________________________   FINAL CLINICAL IMPRESSION(S) / ED DIAGNOSES  Final diagnoses:  Supraventricular tachycardia ([place])      NEW MEDICATIONS STARTED DURING THIS VISIT:  Discharge Medication List as of 09/17/2017  2:31 PM       Note:  This document was prepared using Dragon voice recognition software and may include unintentional dictation errors.    Arta Silence, MD 09/17/17 [phone]

## 2017-09-17 NOTE — ED Notes (Signed)
Pt walked to bathroom, states still feels fine. HR WNL.

## 2017-09-19 ENCOUNTER — Other Ambulatory Visit: Payer: Self-pay

## 2017-09-19 ENCOUNTER — Emergency Department
Admission: EM | Admit: 2017-09-19 | Discharge: 2017-09-19 | Disposition: A | Discharge status: Home / Self Care | Payer: Self-pay | Attending: Emergency Medicine | Admitting: Emergency Medicine

## 2017-09-19 DIAGNOSIS — R42 Dizziness and giddiness: Secondary | ICD-10-CM | POA: Insufficient documentation

## 2017-09-19 DIAGNOSIS — R51 Headache: Secondary | ICD-10-CM | POA: Insufficient documentation

## 2017-09-19 DIAGNOSIS — I10 Essential (primary) hypertension: Secondary | ICD-10-CM | POA: Insufficient documentation

## 2017-09-19 DIAGNOSIS — Z79899 Other long term (current) drug therapy: Secondary | ICD-10-CM | POA: Insufficient documentation

## 2017-09-19 DIAGNOSIS — Z7901 Long term (current) use of anticoagulants: Secondary | ICD-10-CM | POA: Insufficient documentation

## 2017-09-19 LAB — BASIC METABOLIC PANEL
ANION GAP: 9 (ref 5–15)
BUN: 8 mg/dL (ref 6–20)
CALCIUM: 8.9 mg/dL (ref 8.9–10.3)
CO2: 23 mmol/L (ref 22–32)
Chloride: 106 mmol/L (ref 98–111)
Creatinine, Ser: 0.6 mg/dL (ref 0.44–1.00)
GFR calc Af Amer: >60 mL/min (ref >60)
GLUCOSE: 110 mg/dL — AB (ref 70–99)
Potassium: 3.4 mmol/L — ABNORMAL LOW (ref 3.5–5.1)
Sodium: 138 mmol/L (ref 135–145)

## 2017-09-19 LAB — CBC
HCT: 49.8 % — ABNORMAL HIGH (ref 35.0–47.0)
HEMOGLOBIN: 16.5 g/dL — AB (ref 12.0–16.0)
MCH: 27.8 pg (ref 26.0–34.0)
MCHC: 33.1 g/dL (ref 32.0–36.0)
MCV: 84 fL (ref 80.0–100.0)
Platelets: 286 10*3/uL (ref 150–440)
RBC: 5.93 MIL/uL — ABNORMAL HIGH (ref 3.80–5.20)
RDW: 13.8 % (ref 11.5–14.5)
WBC: 5.4 10*3/uL (ref 3.6–11.0)

## 2017-09-19 LAB — TROPONIN I

## 2017-09-19 NOTE — ED Triage Notes (Signed)
Pt states she has had dizziness "all day". Pt states she was here on 09/17/2017 with tachycardia. Pt states "I feel drunk but I haven't been drinking". Pt denies shob, pain.

## 2017-09-19 NOTE — ED Notes (Signed)
ED Provider at bedside. 

## 2017-09-19 NOTE — ED Provider Notes (Signed)
[facility] Emergency Department Provider Note  Time seen: 2:21 AM  I have reviewed the triage vital signs and the nursing notes.   HISTORY  Chief Complaint Hypertension    HPI Kaitlin Brown is a 52 y.o. female with a past medical history of hypertension, anemia, presents to the emergency department for dizziness and high blood pressure.  According to the patient intermittently throughout the day today she has felt somewhat dizzy which she describes as being off balance.  States she was also experiencing a headache and had to take Advil earlier today.  Patient states they were at [facility] and she checked her blood pressure and it was 160/105.  Patient became concerned so she came to the emergency department for evaluation.  Patient states she was seen yesterday because she was feeling like her heart rate was beating fast.  States after she got here everything seemed to go back to normal.  Patient denies any chest pain or trouble breathing.  Denies abdominal pain.  Largely negative review of systems.  Mildly anxious appearing during examination.   Past Medical History:  Diagnosis Date  . Anemia due to blood loss, chronic    due to menorrhagia  . Back pain   . Bladder infection   . Chicken pox   . Fibroid uterus   . Hypertension     Patient Active Problem List   Diagnosis Date Noted  . Anemia 07/17/2016  . Anemia due to chronic blood loss 07/16/2016  . Acute pulmonary embolism ([place]) 03/17/2016  . DVT (deep venous thrombosis) ([place]) 03/17/2016  . Fibroid uterus   . Anemia due to blood loss, chronic   . Symptomatic anemia 09/28/2015  . Uterine mass 09/28/2015    Past Surgical History:  Procedure Laterality Date  . EMBOLIZATION Bilateral 07/18/2016   Procedure: Embolization, uterine arteries;  Surgeon: Algernon Huxley, MD;  Location: [place] [facility];  Service: Cardiovascular;  Laterality: Bilateral;  . HYSTEROSCOPY W/D&C  10/2014   [facility],  Dr Guadalupe Maple  . none      Prior to Admission medications   Medication Sig Start Date End Date Taking? Authorizing Provider  apixaban (ELIQUIS) 5 MG TABS tablet Take 1 tablet (5 mg total) by mouth 2 (two) times daily. 10mg  BID for 1 week and then 5 mg BID. Patient not taking: Reported on 07/22/2016 03/18/16   Hillary Bow, MD  cyclobenzaprine (FLEXERIL) 5 MG tablet Take 1-2 tablets 3 times daily as needed 03/23/17   Laban Emperor, PA-C  docusate sodium (COLACE) 100 MG capsule Take 1 capsule (100 mg total) by mouth 2 (two) times daily. 07/20/16   Gae Dry, MD  ferrous gluconate (FERGON) 324 MG tablet Take 1 tablet (324 mg total) by mouth daily with breakfast. 09/29/15   Hower, Aaron Mose, MD  HYDROcodone-acetaminophen (NORCO) 7.5-325 MG tablet Take 1 tablet by mouth every 6 (six) hours as needed for moderate pain or severe pain. 07/20/16   Gae Dry, MD  lisinopril-hydrochlorothiazide (PRINZIDE,ZESTORETIC) 10-12.5 MG per tablet Take 1 tablet by mouth daily.    [provider]    No Known Allergies  Family History  Problem Relation Age of Onset  . Hypertension Mother   . Diabetes Mother   . Glaucoma Mother   . Heart disease Father        Congestive heart condition  . Clotting disorder Maternal Grandmother     Social History Social History   Tobacco Use  . Smoking status: Never  Smoker  . Smokeless tobacco: Never Used  Substance Use Topics  . Alcohol use: Yes    Comment: occasionally  . Drug use: No    Review of Systems Constitutional: Intermittent dizziness today. Eyes: Negative for visual complaints ENT: Negative for recent illness/congestion Cardiovascular: Negative for chest pain. Respiratory: Negative for shortness of breath. Gastrointestinal: Negative for abdominal pain, vomiting  Musculoskeletal: Negative for pain or swelling Skin: Negative for skin complaints  Neurological: Moderate headache, largely resolved at this time. All other ROS  negative  ____________________________________________   PHYSICAL EXAM:  VITAL SIGNS: ED Triage Vitals  Enc Vitals Group     BP 09/19/17 0104 (!) 166/111     Pulse Rate 09/19/17 0104 (!) 101     Resp 09/19/17 0104 18     Temp 09/19/17 0104 98.2 F (36.8 C)     Temp Source 09/19/17 0104 Oral     SpO2 09/19/17 0104 97 %     Weight 09/19/17 0105 230 lb (104.3 kg)     Height 09/19/17 0105 5\' 6"  (1.676 m)     Head Circumference --      Peak Flow --      Pain Score 09/19/17 0105 0     Pain Loc --      Pain Edu? --      Excl. in [place]? --     Constitutional: Alert and oriented. Well appearing and in no distress. Eyes: Normal exam ENT   Head: Normocephalic and atraumatic.   Mouth/Throat: Mucous membranes are moist. Cardiovascular: Normal rate, regular rhythm. No murmur Respiratory: Normal respiratory effort without tachypnea nor retractions. Breath sounds are clear  Gastrointestinal: Soft and nontender. No distention.  Musculoskeletal: Nontender with normal range of motion in all extremities. No lower extremity tenderness or edema. Neurologic:  Normal speech and language. No gross focal neurologic deficits Skin:  Skin is warm, dry and intact.  Psychiatric: Mood and affect are normal.   ____________________________________________    EKG  EKG reviewed and interpreted by myself shows normal sinus rhythm at 100 bpm with a narrow QRS, normal axis, normal intervals, no concerning ST changes noted.  ____________________________________________   INITIAL IMPRESSION / ASSESSMENT AND PLAN / ED COURSE  Pertinent labs & imaging results that were available during my care of the patient were reviewed by me and considered in my medical decision making (see chart for details).  Patient presents to the emergency department with high blood pressure and intermittent dizziness and headache today.  Overall the patient appears well, mildly anxious but overall well.  Patient has a normal  physical examination, no concerning red flags on history.  Labs are normal including negative troponin.  Blood pressure currently 149/104.  I discussed with the patient to continue taking her prescribed lisinopril and to follow-up with her primary care doctor next week for recheck/reevaluation.  I also reassured the patient that her blood pressure although elevated is not of immediate concern for stroke or heart attack.  ____________________________________________   FINAL CLINICAL IMPRESSION(S) / ED DIAGNOSES  Hypertension Dizziness    Harvest Dark, MD 09/19/17 [phone]

## 2017-09-23 ENCOUNTER — Encounter: Payer: Self-pay | Admitting: Emergency Medicine

## 2017-09-23 ENCOUNTER — Emergency Department
Admission: EM | Admit: 2017-09-23 | Discharge: 2017-09-23 | Disposition: A | Discharge status: Home / Self Care | Payer: Self-pay | Attending: Emergency Medicine | Admitting: Emergency Medicine

## 2017-09-23 DIAGNOSIS — I471 Supraventricular tachycardia, unspecified: Secondary | ICD-10-CM

## 2017-09-23 DIAGNOSIS — I10 Essential (primary) hypertension: Secondary | ICD-10-CM | POA: Insufficient documentation

## 2017-09-23 DIAGNOSIS — Z7901 Long term (current) use of anticoagulants: Secondary | ICD-10-CM | POA: Insufficient documentation

## 2017-09-23 DIAGNOSIS — Z79899 Other long term (current) drug therapy: Secondary | ICD-10-CM | POA: Insufficient documentation

## 2017-09-23 HISTORY — DX: Supraventricular tachycardia: I47.1

## 2017-09-23 HISTORY — DX: Supraventricular tachycardia, unspecified: I47.10

## 2017-09-23 LAB — CBC WITH DIFFERENTIAL/PLATELET
BASOS PCT: 1 %
Basophils Absolute: 0.1 10*3/uL (ref 0–0.1)
EOS ABS: 0.1 10*3/uL (ref 0–0.7)
Eosinophils Relative: 2 %
HCT: 47.8 % — ABNORMAL HIGH (ref 35.0–47.0)
HEMOGLOBIN: 15.9 g/dL (ref 12.0–16.0)
Lymphocytes Relative: 34 %
Lymphs Abs: 1.8 10*3/uL (ref 1.0–3.6)
MCH: 28.3 pg (ref 26.0–34.0)
MCHC: 33.3 g/dL (ref 32.0–36.0)
MCV: 85 fL (ref 80.0–100.0)
MONO ABS: 0.5 10*3/uL (ref 0.2–0.9)
MONOS PCT: 9 %
NEUTROS PCT: 54 %
Neutro Abs: 2.9 10*3/uL (ref 1.4–6.5)
Platelets: 281 10*3/uL (ref 150–440)
RBC: 5.62 MIL/uL — ABNORMAL HIGH (ref 3.80–5.20)
RDW: 14.2 % (ref 11.5–14.5)
WBC: 5.3 10*3/uL (ref 3.6–11.0)

## 2017-09-23 LAB — BASIC METABOLIC PANEL
Anion gap: 7 (ref 5–15)
BUN: 9 mg/dL (ref 6–20)
CALCIUM: 8.9 mg/dL (ref 8.9–10.3)
CO2: 25 mmol/L (ref 22–32)
CREATININE: 0.56 mg/dL (ref 0.44–1.00)
Chloride: 108 mmol/L (ref 98–111)
GFR calc non Af Amer: >60 mL/min (ref >60)
Glucose, Bld: 103 mg/dL — ABNORMAL HIGH (ref 70–99)
Potassium: 3.3 mmol/L — ABNORMAL LOW (ref 3.5–5.1)
SODIUM: 140 mmol/L (ref 135–145)

## 2017-09-23 LAB — MAGNESIUM: MAGNESIUM: 2.2 mg/dL (ref 1.7–2.4)

## 2017-09-23 LAB — TSH: TSH: 1.232 u[IU]/mL (ref 0.350–4.500)

## 2017-09-23 MED ORDER — METOPROLOL TARTRATE 25 MG PO TABS: 25.0000 mg | ORAL_TABLET | Freq: Two times a day (BID) | ORAL | 0 refills | Status: DC

## 2017-09-23 MED ORDER — ADENOSINE 6 MG/2ML IV SOLN
INTRAVENOUS | Status: AC
  Administered 2017-09-23: 6 mg via INTRAVENOUS
  Filled 2017-09-23: qty 2

## 2017-09-23 MED ORDER — METOPROLOL TARTRATE 25 MG PO TABS
25.0000 mg | ORAL_TABLET | Freq: Once | ORAL | Status: AC
  Administered 2017-09-23: 25 mg via ORAL
  Filled 2017-09-23: qty 1

## 2017-09-23 MED ORDER — ADENOSINE 12 MG/4ML IV SOLN
12.0000 mg | Freq: Once | INTRAVENOUS | Status: AC
Start: 2017-09-23 — End: 2017-09-23
  Administered 2017-09-23: 12 mg via INTRAVENOUS

## 2017-09-23 MED ORDER — ADENOSINE 12 MG/4ML IV SOLN
INTRAVENOUS | Status: AC
  Administered 2017-09-23: 12 mg via INTRAVENOUS
  Filled 2017-09-23: qty 4

## 2017-09-23 MED ORDER — SODIUM CHLORIDE 0.9 % IV BOLUS
1000.0000 mL | Freq: Once | INTRAVENOUS | Status: AC
  Administered 2017-09-23: 1000 mL via INTRAVENOUS

## 2017-09-23 MED ORDER — ADENOSINE 6 MG/2ML IV SOLN
6.0000 mg | Freq: Once | INTRAVENOUS | Status: AC
  Administered 2017-09-23: 6 mg via INTRAVENOUS

## 2017-09-23 NOTE — ED Provider Notes (Signed)
-----------------------------------------   5:30 PM on 09/23/2017 -----------------------------------------  Patient care assumed from Dr. Joni Fears.  Per report patient was found to be in SVT, converted to normal sinus rhythm after adenosine.  Patient's labs have resulted largely within normal limits.  Patient is feeling much better.  Patient had not been taking her prescribed metoprolol.  We will refill the patient's metoprolol have the patient follow-up with her doctor.  Patient agreeable to plan of care.   Harvest Dark, MD 09/23/17 1731

## 2017-09-23 NOTE — ED Provider Notes (Signed)
[facility] Emergency Department Provider Note  ____________________________________________  Time seen: Approximately 3:18 PM  I have reviewed the triage vital signs and the nursing notes.   HISTORY  Chief Complaint Palpitations    HPI Kaitlin Brown is a 52 y.o. female with a history of hypertension and SVT who complains of racing heartbeat.  Started while she was at the gas station getting some snacks.  Tried Valsalva maneuvers without relief.  Denies chest pain or shortness of breath, no dizziness or syncope.  Symptoms are constant, severe, no aggravating or alleviating factors.  Does note that she has had lots of caffeinated beverages today.  Feels like she is eating and drinking normally.      Past Medical History:  Diagnosis Date  . Anemia due to blood loss, chronic    due to menorrhagia  . Back pain   . Bladder infection   . Chicken pox   . Fibroid uterus   . Hypertension   . SVT (supraventricular tachycardia) [facility])      Patient Active Problem List   Diagnosis Date Noted  . Anemia 07/17/2016  . Anemia due to chronic blood loss 07/16/2016  . Acute pulmonary embolism ([place]) 03/17/2016  . DVT (deep venous thrombosis) ([place]) 03/17/2016  . Fibroid uterus   . Anemia due to blood loss, chronic   . Symptomatic anemia 09/28/2015  . Uterine mass 09/28/2015     Past Surgical History:  Procedure Laterality Date  . EMBOLIZATION Bilateral 07/18/2016   Procedure: Embolization, uterine arteries;  Surgeon: Algernon Huxley, MD;  Location: [facility];  Service: Cardiovascular;  Laterality: Bilateral;  . HYSTEROSCOPY W/D&C  10/2014   [facility], Dr Guadalupe Maple  . none       Prior to Admission medications   Medication Sig Start Date End Date Taking? Authorizing Provider  apixaban (ELIQUIS) 5 MG TABS tablet Take 1 tablet (5 mg total) by mouth 2 (two) times daily. 10mg  BID for 1 week and then 5 mg BID. Patient not taking: Reported on 07/22/2016 03/18/16    Hillary Bow, MD  cyclobenzaprine (FLEXERIL) 5 MG tablet Take 1-2 tablets 3 times daily as needed 03/23/17   Laban Emperor, PA-C  docusate sodium (COLACE) 100 MG capsule Take 1 capsule (100 mg total) by mouth 2 (two) times daily. 07/20/16   Gae Dry, MD  ferrous gluconate (FERGON) 324 MG tablet Take 1 tablet (324 mg total) by mouth daily with breakfast. 09/29/15   Hower, Aaron Mose, MD  HYDROcodone-acetaminophen (NORCO) 7.5-325 MG tablet Take 1 tablet by mouth every 6 (six) hours as needed for moderate pain or severe pain. 07/20/16   Gae Dry, MD  lisinopril-hydrochlorothiazide (PRINZIDE,ZESTORETIC) 10-12.5 MG per tablet Take 1 tablet by mouth daily.    [provider]  metoprolol tartrate (LOPRESSOR) 25 MG tablet Take 1 tablet (25 mg total) by mouth 2 (two) times daily. 09/23/17 10/23/17  Carrie Mew, MD     Allergies Patient has no known allergies.   Family History  Problem Relation Age of Onset  . Hypertension Mother   . Diabetes Mother   . Glaucoma Mother   . Heart disease Father        Congestive heart condition  . Clotting disorder Maternal Grandmother     Social History Social History   Tobacco Use  . Smoking status: Never Smoker  . Smokeless tobacco: Never Used  Substance Use Topics  . Alcohol use: Yes    Comment: occasionally  . Drug  use: No    Review of Systems  Constitutional:   No fever or chills.  ENT:   No sore throat. No rhinorrhea. Cardiovascular:   No chest pain or syncope.  Positive fast heartbeat Respiratory:   No dyspnea or cough. Gastrointestinal:   Negative for abdominal pain, vomiting and diarrhea.  Musculoskeletal:   Negative for focal pain or swelling All other systems reviewed and are negative except as documented above in ROS and HPI.  ____________________________________________   PHYSICAL EXAM:  VITAL SIGNS: ED Triage Vitals  Enc Vitals Group     BP 09/23/17 1434 (!) 132/101     Pulse Rate 09/23/17 1434 (!) 190      Resp 09/23/17 1434 20     Temp 09/23/17 1434 98.6 F (37 C)     Temp Source 09/23/17 1434 Oral     SpO2 09/23/17 1434 97 %     Weight 09/23/17 1435 230 lb (104.3 kg)     Height 09/23/17 1435 5\' 6"  (1.676 m)     Head Circumference --      Peak Flow --      Pain Score 09/23/17 1435 0     Pain Loc --      Pain Edu? --      Excl. in [place]? --     Vital signs reviewed, nursing assessments reviewed.   Constitutional:   Alert and oriented. Non-toxic appearance. Eyes:   Conjunctivae are normal. EOMI. PERRL. ENT      Head:   Normocephalic and atraumatic.      Nose:   No congestion/rhinnorhea.       Mouth/Throat:   MMM, no pharyngeal erythema. No peritonsillar mass.       Neck:   No meningismus. Full ROM. Hematological/Lymphatic/Immunilogical:   No cervical lymphadenopathy. Cardiovascular:   Tachycardia heart rate 190. Symmetric bilateral radial and DP pulses.  No murmurs.  Respiratory:   Normal respiratory effort without tachypnea/retractions. Breath sounds are clear and equal bilaterally. No wheezes/rales/rhonchi. Gastrointestinal:   Soft and nontender. Non distended. There is no CVA tenderness.  No rebound, rigidity, or guarding. Musculoskeletal:   Normal range of motion in all extremities. No joint effusions.  No lower extremity tenderness.  No edema. Neurologic:   Normal speech and language.  Motor grossly intact. No acute focal neurologic deficits are appreciated.  Skin:    Skin is warm, dry and intact. No rash noted.  No petechiae, purpura, or bullae.  ____________________________________________    LABS (pertinent positives/negatives) (all labs ordered are listed, but only abnormal results are displayed) Labs Reviewed  BASIC METABOLIC PANEL  CBC WITH DIFFERENTIAL/PLATELET  MAGNESIUM  TSH   ____________________________________________   EKG  Interpreted by me Supraventricular tachycardia rate 189, normal axis and intervals.  Normal QRS ST segments and T waves.  No  ischemic changes.  Sinus tachycardia rate 120, normal axis intervals QRS ST segments and T waves.  ____________________________________________    RADIOLOGY  No results found.  ____________________________________________   PROCEDURES .Critical Care Performed by: Carrie Mew, MD Authorized by: Carrie Mew, MD   Critical care provider statement:    Critical care time (minutes):  30   Critical care time was exclusive of:  Separately billable procedures and treating other patients   Critical care was necessary to treat or prevent imminent or life-threatening deterioration of the following conditions:  Cardiac failure   Critical care was time spent personally by me on the following activities:  Development of treatment plan with patient or surrogate,  discussions with consultants, evaluation of patient's response to treatment, examination of patient, obtaining history from patient or surrogate, ordering and performing treatments and interventions, ordering and review of laboratory studies, ordering and review of radiographic studies, pulse oximetry, re-evaluation of patient's condition and review of old charts    ____________________________________________    CLINICAL IMPRESSION / [place] / ED COURSE  Pertinent labs & imaging results that were available during my care of the patient were reviewed by me and considered in my medical decision making (see chart for details).    Patient presents with SVT, vagal maneuvers unsuccessful.  Possibly related to dehydration versus caffeine intake.  I will check labs including TSH, give IV fluids for hydration after getting adenosine for cardioversion.  Clinical Course as of Sep 23 1516  Wed Sep 23, 2017  1453 Patient converted to sinus tachycardia with 2 doses of adenosine IV push.  Blood pressure remained stable, symptoms improved.  Check labs, IV fluids for hydration.  If labs are reassuring, would discharge on oral  metoprolol to cardiology follow-up.   [PS]    Clinical Course User Index [PS] Carrie Mew, MD     ----------------------------------------- 3:20 PM on 09/23/2017 -----------------------------------------  Care signed out to Dr. Kerman Passey. Anticipate DC with metoprolol rx if labs are reassuring.  ____________________________________________   FINAL CLINICAL IMPRESSION(S) / ED DIAGNOSES    Final diagnoses:  SVT (supraventricular tachycardia) [facility])     ED Discharge Orders        Ordered    metoprolol tartrate (LOPRESSOR) 25 MG tablet  2 times daily     09/23/17 1517      Portions of this note were generated with dragon dictation software. Dictation errors may occur despite best attempts at proofreading.    Carrie Mew, MD 09/23/17 1520

## 2017-09-23 NOTE — ED Notes (Signed)
MD at bedside. 

## 2017-09-23 NOTE — ED Triage Notes (Signed)
Patient presents to the ED with palpitations.  States it feels similar to when she was here on the 4th of July and had to have medicine to slow it down.  Patient states heart rate was in the 200s.

## 2017-09-23 NOTE — ED Notes (Signed)
MD at bedside to update pt. Pt in NAD at this time and remains alone in the room.

## 2017-12-08 IMAGING — US US EXTREM LOW VENOUS BILAT
1 series · 12 of 24 positions shown · non-contrast
Comparison: 03/17/2016

CLINICAL DATA: Bilateral lower extremity edema. History of
pulmonary embolism with right popliteal and calf vein DVT in
[REDACTED].



[Series 1: us extrem low venous bilat · 0.09mm/px · 12 of 61 slices shown]
[im 3/61]
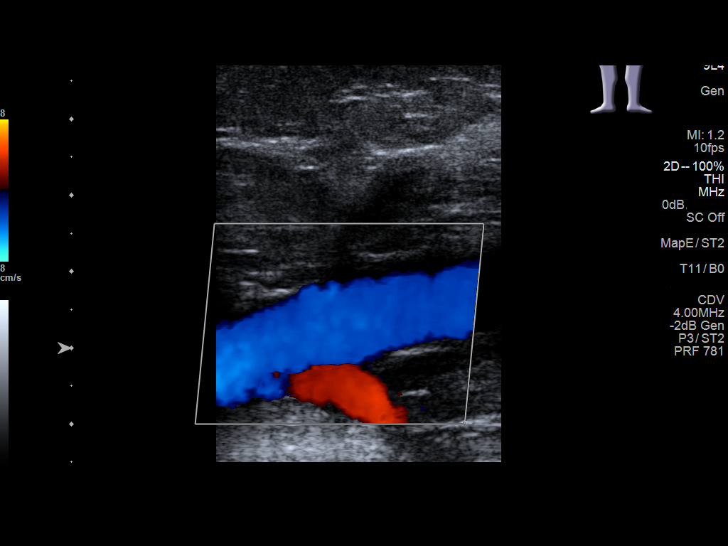
[im 8/61]
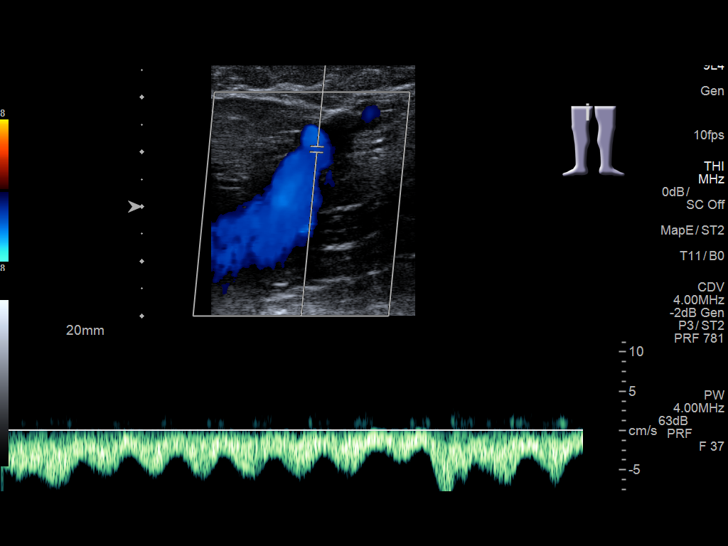
[im 14/61]
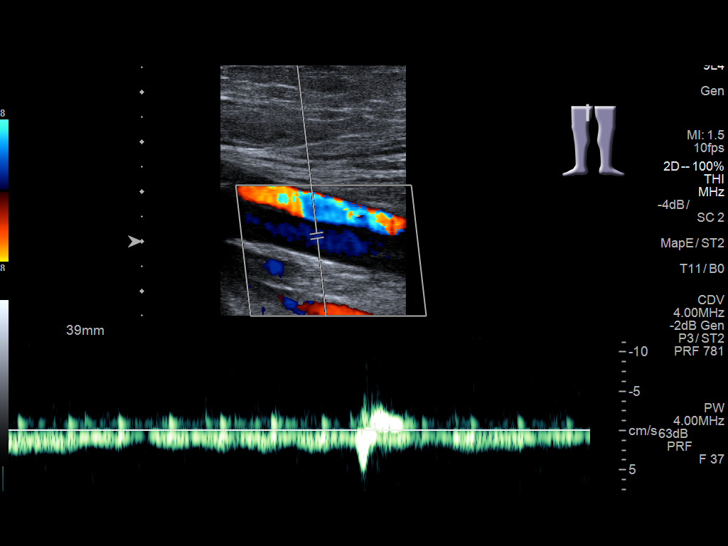
[im 19/61]
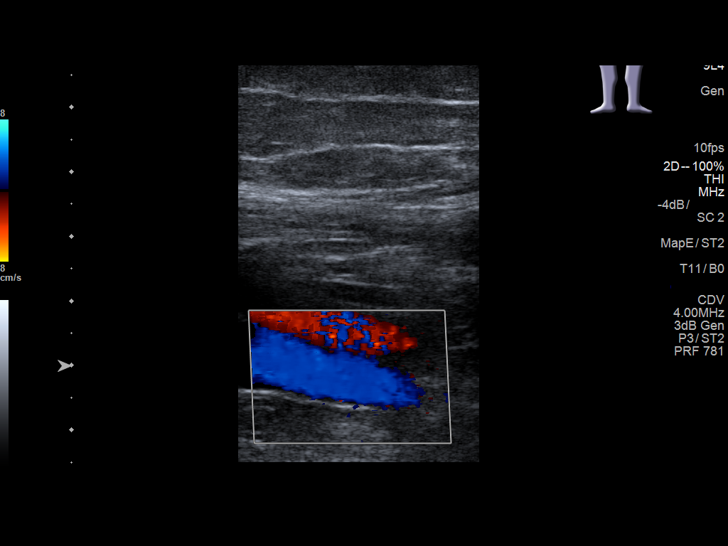
[im 24/61]
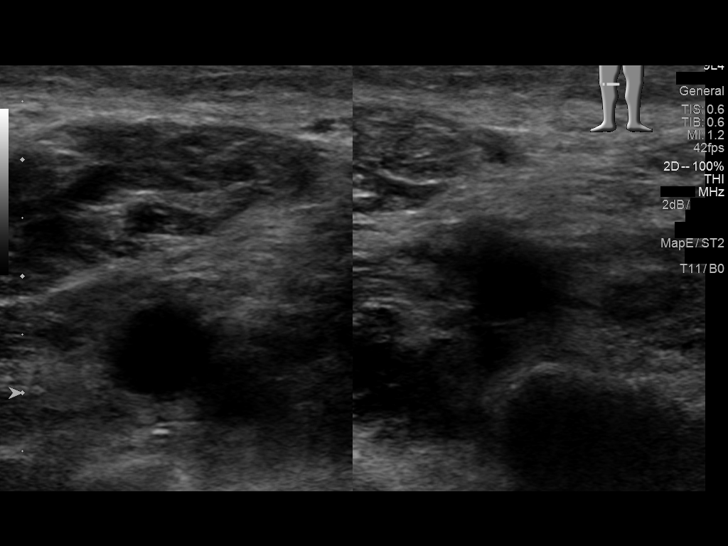
[im 29/61]
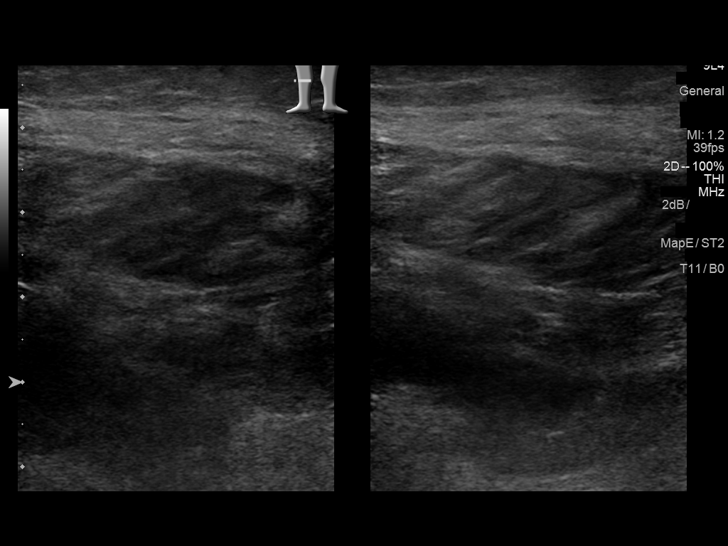
[im 34/61]
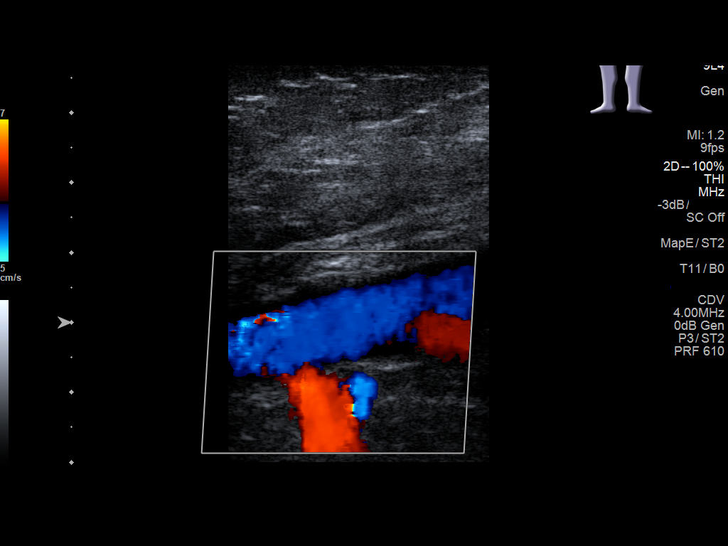
[im 40/61]
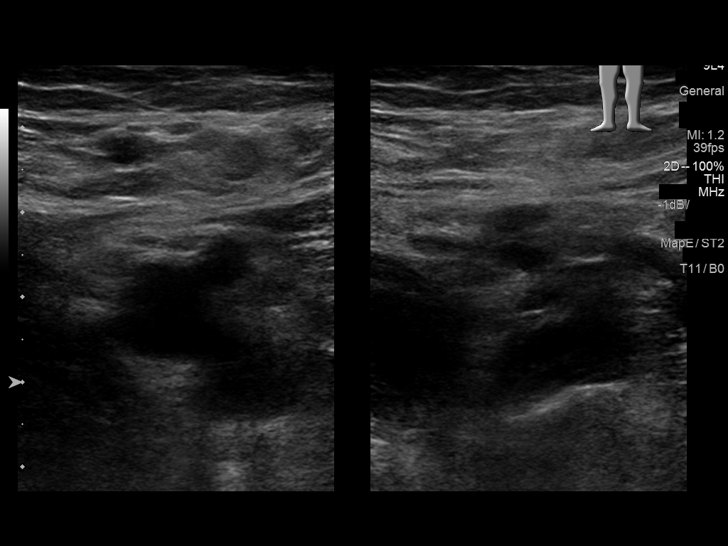
[im 45/61]
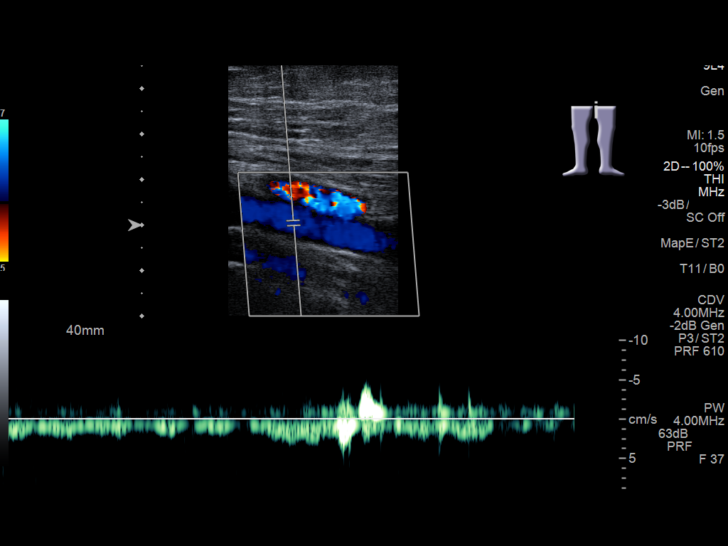
[im 50/61]
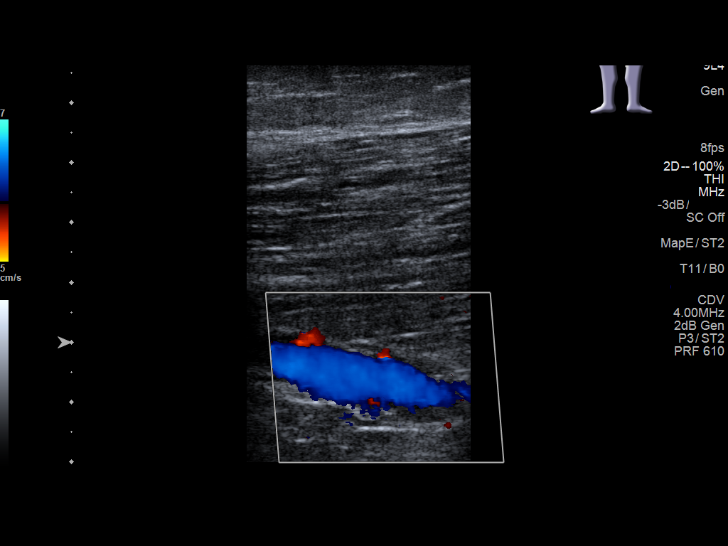
[im 55/61]
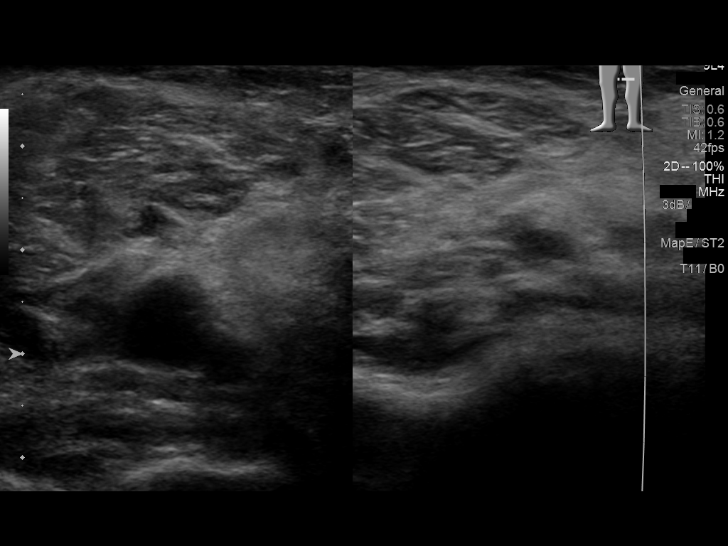
[im 61/61]
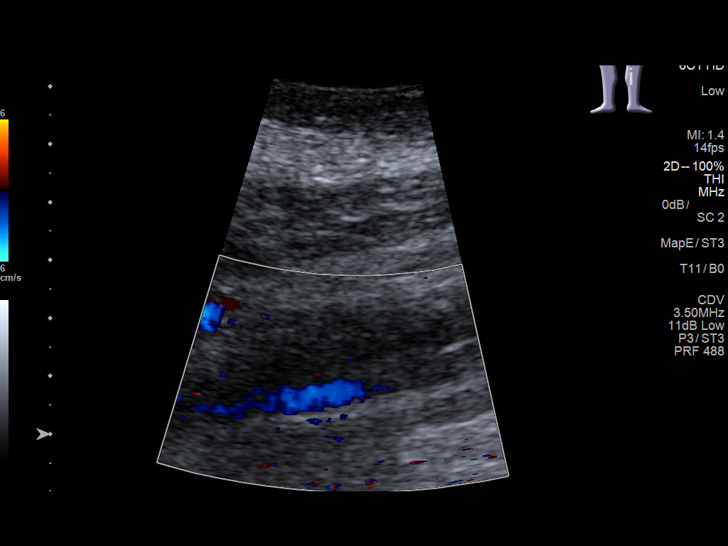

[12 of 24 positions shown; findings below may reference images not displayed]

FINDINGS: RIGHT LOWER EXTREMITY

Common Femoral Vein: No evidence of thrombus. Normal
compressibility, respiratory phasicity and response to augmentation.

Saphenofemoral Junction: No evidence of thrombus. Normal
compressibility and flow on color Doppler imaging.

Profunda Femoral Vein: No evidence of thrombus. Normal
compressibility and flow on color Doppler imaging.

Femoral Vein: No evidence of thrombus. Normal compressibility,
respiratory phasicity and response to augmentation.

Popliteal Vein: No evidence of thrombus. Prior imaged DVT has
completely resolved. Normal compressibility, respiratory phasicity
and response to augmentation.

Calf Veins: No evidence of thrombus. Prior DVT and posterior tibial
and peroneal veins has completely resolved. Normal compressibility
and flow on color Doppler imaging.

Superficial Great Saphenous Vein: No evidence of thrombus. Normal
compressibility and flow on color Doppler imaging.

Venous Reflux:  None.

Other Findings: No evidence of superficial thrombophlebitis or
abnormal fluid collection.

LEFT LOWER EXTREMITY

Common Femoral Vein: No evidence of thrombus. Normal
compressibility, respiratory phasicity and response to augmentation.

Saphenofemoral Junction: No evidence of thrombus. Normal
compressibility and flow on color Doppler imaging.

Profunda Femoral Vein: No evidence of thrombus. Normal
compressibility and flow on color Doppler imaging.

Femoral Vein: No evidence of thrombus. Normal compressibility,
respiratory phasicity and response to augmentation.

Popliteal Vein: No evidence of thrombus. Normal compressibility,
respiratory phasicity and response to augmentation.

Calf Veins: No evidence of thrombus. Normal compressibility and flow
on color Doppler imaging.

Superficial Great Saphenous Vein: No evidence of thrombus. Normal
compressibility and flow on color Doppler imaging.

Venous Reflux:  None.

Other Findings: No evidence of superficial thrombophlebitis or
abnormal fluid collection.
IMPRESSION: No evidence of bilateral lower extremity deep venous thrombosis.
Prior right popliteal and calf vein DVT has completely resolved.

## 2018-01-07 ENCOUNTER — Telehealth: Payer: Self-pay | Admitting: Pharmacy Technician

## 2018-01-07 NOTE — Telephone Encounter (Signed)
Patient failed to provide 2019 poi.  No additional medication assistance will be provided by [facility] without the required proof of income documentation.  Patient notified by letter.  Lekisha Mcghee J. Symon [place] Care Manager [facility] 

## 2018-08-25 ENCOUNTER — Other Ambulatory Visit: Payer: Self-pay

## 2018-08-25 ENCOUNTER — Emergency Department
Admission: EM | Admit: 2018-08-25 | Discharge: 2018-08-25 | Disposition: A | Discharge status: Home / Self Care | Payer: Self-pay | Attending: Emergency Medicine | Admitting: Emergency Medicine

## 2018-08-25 ENCOUNTER — Encounter: Payer: Self-pay | Admitting: Emergency Medicine

## 2018-08-25 DIAGNOSIS — I471 Supraventricular tachycardia: Secondary | ICD-10-CM | POA: Insufficient documentation

## 2018-08-25 DIAGNOSIS — Z79899 Other long term (current) drug therapy: Secondary | ICD-10-CM | POA: Insufficient documentation

## 2018-08-25 DIAGNOSIS — Z91040 Latex allergy status: Secondary | ICD-10-CM | POA: Insufficient documentation

## 2018-08-25 DIAGNOSIS — I10 Essential (primary) hypertension: Secondary | ICD-10-CM | POA: Insufficient documentation

## 2018-08-25 LAB — CBC WITH DIFFERENTIAL/PLATELET
Abs Immature Granulocytes: 0.01 10*3/uL (ref 0.00–0.07)
Basophils Absolute: 0 10*3/uL (ref 0.0–0.1)
Basophils Relative: 1 %
Eosinophils Absolute: 0.4 10*3/uL (ref 0.0–0.5)
Eosinophils Relative: 6 %
HCT: 48.2 % — ABNORMAL HIGH (ref 36.0–46.0)
Hemoglobin: 15.6 g/dL — ABNORMAL HIGH (ref 12.0–15.0)
Immature Granulocytes: 0 %
Lymphocytes Relative: 36 %
Lymphs Abs: 2.4 10*3/uL (ref 0.7–4.0)
MCH: 26.3 pg (ref 26.0–34.0)
MCHC: 32.4 g/dL (ref 30.0–36.0)
MCV: 81.1 fL (ref 80.0–100.0)
Monocytes Absolute: 0.4 10*3/uL (ref 0.1–1.0)
Monocytes Relative: 6 %
Neutro Abs: 3.4 10*3/uL (ref 1.7–7.7)
Neutrophils Relative %: 51 %
Platelets: 383 10*3/uL (ref 150–400)
RBC: 5.94 MIL/uL — ABNORMAL HIGH (ref 3.87–5.11)
RDW: 13.5 % (ref 11.5–15.5)
WBC: 6.6 10*3/uL (ref 4.0–10.5)
nRBC: 0 % (ref 0.0–0.2)

## 2018-08-25 LAB — BASIC METABOLIC PANEL
Anion gap: 9 (ref 5–15)
BUN: 10 mg/dL (ref 6–20)
CO2: 24 mmol/L (ref 22–32)
Calcium: 9.1 mg/dL (ref 8.9–10.3)
Chloride: 104 mmol/L (ref 98–111)
Creatinine, Ser: 0.68 mg/dL (ref 0.44–1.00)
GFR calc Af Amer: >60 mL/min (ref >60)
GFR calc non Af Amer: >60 mL/min (ref >60)
Glucose, Bld: 148 mg/dL — ABNORMAL HIGH (ref 70–99)
Potassium: 3.6 mmol/L (ref 3.5–5.1)
Sodium: 137 mmol/L (ref 135–145)

## 2018-08-25 LAB — MAGNESIUM: Magnesium: 2 mg/dL (ref 1.7–2.4)

## 2018-08-25 MED ORDER — ADENOSINE 12 MG/4ML IV SOLN
INTRAVENOUS | Status: AC
  Filled 2018-08-25: qty 4

## 2018-08-25 MED ORDER — METOPROLOL TARTRATE 25 MG PO TABS
25.0000 mg | ORAL_TABLET | Freq: Once | ORAL | Status: AC
  Administered 2018-08-25: 25 mg via ORAL
  Filled 2018-08-25: qty 1

## 2018-08-25 MED ORDER — ADENOSINE 6 MG/2ML IV SOLN
INTRAVENOUS | Status: AC
  Filled 2018-08-25: qty 2

## 2018-08-25 MED ORDER — SODIUM CHLORIDE 0.9 % IV BOLUS
1000.0000 mL | Freq: Once | INTRAVENOUS | Status: AC
  Administered 2018-08-25: 1000 mL via INTRAVENOUS

## 2018-08-25 NOTE — Discharge Instructions (Signed)
As we discussed, your signs and symptoms strongly suggest a condition called paroxysmal supraventricular tachycardia (PSVT).  This is a generally benign condition, but we recommend you follow up with the listed cardiologist for further evaluation.  Please read through the included information and try some of the techniques we discussed the next time you have the symptoms.  Continue taking your regular medications starting in the morning.  Return to the Emergency Department if you develop new or worsening symptoms that concern you.

## 2018-08-25 NOTE — ED Provider Notes (Signed)
[facility] Emergency Department Provider Note  ____________________________________________   First MD Initiated Contact with Patient 08/25/18 0125     (approximate)  I have reviewed the triage vital signs and the nursing notes.   HISTORY  Chief Complaint Tachycardia    HPI Kaitlin Brown is a 53 y.o. female with medical history as listed below which notably includes SVT which required 2 doses of adenosine prior for conversion to normal sinus rhythm.  She presents  tonight by EMS for evaluation of rapid heart rate.  She says that tonight her heart all of a sudden started beating very fast and it caused her some chest pressure as well as feeling very anxious.  She remembered it from the last time this happened.  The last time it happened she had gone off of her metoprolol by her own decision but she was started back on metoprolol and she believes that she takes 25 mg twice daily in addition to her other blood pressure medicine (lisinopril and hydrochlorothiazide).  She has been compliant with the metoprolol.  She had a hysterectomy a couple weeks ago at [facility] and is on Lovenox after the surgery.  She has a history in 2018 of a lower extremity DVT after a contusion to her ankle.  She had genetic testing which did not demonstrate anything and she was taken off long-term anticoagulation and it was thought to be secondary to the trauma to her ankle.  Her heart rate was 189 upon arrival to the emergency department and she was obviously anxious but not in severe distress.  See hospital course for additional details.  She denies fever/chills, sore throat, nausea, vomiting, and abdominal pain.  She has little bit of chest heaviness after the palpitations started.  She is not having any shortness of breath or cough.  She has not been around anyone with COVID-19.  She has been recovering well from her hysterectomy.        Past Medical History:  Diagnosis Date  .  Anemia due to blood loss, chronic    due to menorrhagia  . Back pain   . Bladder infection   . Chicken pox   . Fibroid uterus   . Hypertension   . SVT (supraventricular tachycardia) [facility])     Patient Active Problem List   Diagnosis Date Noted  . Anemia 07/17/2016  . Anemia due to chronic blood loss 07/16/2016  . Acute pulmonary embolism ([place]) 03/17/2016  . DVT (deep venous thrombosis) ([place]) 03/17/2016  . Fibroid uterus   . Anemia due to blood loss, chronic   . Symptomatic anemia 09/28/2015  . Uterine mass 09/28/2015    Past Surgical History:  Procedure Laterality Date  . ABDOMINAL HYSTERECTOMY    . EMBOLIZATION Bilateral 07/18/2016   Procedure: Embolization, uterine arteries;  Surgeon: Algernon Huxley, MD;  Location: [place] [facility];  Service: Cardiovascular;  Laterality: Bilateral;  . HYSTEROSCOPY W/D&C  10/2014   [facility], Dr Guadalupe Maple  . none      Prior to Admission medications   Medication Sig Start Date End Date Taking? Authorizing Provider  acetaminophen (TYLENOL) 325 MG tablet Take by mouth. 08/12/18  Yes [provider]  enoxaparin (LOVENOX) 40 MG/0.4ML injection Inject into the skin. 08/13/18 09/10/18 Yes [provider]  hydrOXYzine (ATARAX/VISTARIL) 25 MG tablet Take by mouth. 08/16/18  Yes [provider]  ibuprofen (ADVIL) 600 MG tablet Take by mouth. 08/12/18  Yes [provider]  norethindrone (AYGESTIN) 5 MG tablet  Take by mouth. 07/26/18 07/26/19 Yes [provider]  polyethylene glycol (MIRALAX / GLYCOLAX) 17 g packet Take by mouth. 08/12/18 08/27/18 Yes [provider]  cyclobenzaprine (FLEXERIL) 5 MG tablet Take 1-2 tablets 3 times daily as needed 03/23/17   Laban Emperor, PA-C  ferrous gluconate (FERGON) 324 MG tablet Take 1 tablet (324 mg total) by mouth daily with breakfast. 09/29/15   Hower, Aaron Mose, MD  Iron-Vitamins (GERITOL COMPLETE PO) Take by mouth.    [provider]  lisinopril-hydrochlorothiazide  (PRINZIDE,ZESTORETIC) 10-12.5 MG per tablet Take 1 tablet by mouth daily.    [provider]  metoprolol succinate (TOPROL-XL) 25 MG 24 hr tablet Take 25 mg by mouth daily. 08/14/18   [provider]  metoprolol tartrate (LOPRESSOR) 25 MG tablet Take 1 tablet (25 mg total) by mouth 2 (two) times daily. 09/23/17 10/23/17  Carrie Mew, MD    Allergies Latex  Family History  Problem Relation Age of Onset  . Hypertension Mother   . Diabetes Mother   . Glaucoma Mother   . Heart disease Father        Congestive heart condition  . Clotting disorder Maternal Grandmother     Social History Social History   Tobacco Use  . Smoking status: Never Smoker  . Smokeless tobacco: Never Used  Substance Use Topics  . Alcohol use: Yes    Comment: occasionally  . Drug use: No    Review of Systems Constitutional: No fever/chills Eyes: No visual changes. ENT: No sore throat. Cardiovascular: Chest heaviness after palpitations began. Respiratory: Denies shortness of breath. Gastrointestinal: No abdominal pain.  No nausea, no vomiting.  No diarrhea.  No constipation. Genitourinary: Negative for dysuria. Musculoskeletal: Negative for neck pain.  Negative for back pain. Integumentary: Negative for rash. Neurological: Negative for headaches, focal weakness or numbness.   ____________________________________________   PHYSICAL EXAM:  VITAL SIGNS: ED Triage Vitals  Enc Vitals Group     BP 08/25/18 0115 (!) 144/116     Pulse Rate 08/25/18 0115 (!) 189     Resp 08/25/18 0115 (!) 22     Temp 08/25/18 0115 98.6 F (37 C)     Temp Source 08/25/18 0115 Oral     SpO2 08/25/18 0116 96 %     Weight 08/25/18 0127 111.1 kg (245 lb)     Height 08/25/18 0127 1.676 m (5\' 6" )     Head Circumference --      Peak Flow --      Pain Score 08/25/18 0127 0     Pain Loc --      Pain Edu? --      Excl. in [place]? --     Constitutional: Alert and oriented.  Generally well-appearing but  obviously anxious. Eyes: Conjunctivae are normal.  Head: Atraumatic. Nose: No congestion/rhinnorhea. Mouth/Throat: Mucous membranes are moist. Neck: No stridor.  No meningeal signs.   Cardiovascular: Initial heart rate was 189 consistent with SVT, regular rhythm. Good peripheral circulation. Grossly normal heart sounds. Respiratory: Normal respiratory effort.  No retractions. No audible wheezing. Gastrointestinal: Soft and nontender. No distention.  Musculoskeletal: No lower extremity tenderness nor edema. No gross deformities of extremities. Neurologic:  Normal speech and language. No gross focal neurologic deficits are appreciated.  Skin:  Skin is warm, dry and intact. No rash noted. Psychiatric: Mood and affect are anxious but otherwise appropriate.  No concerning psychiatric signs nor symptoms.  ____________________________________________   LABS (all labs ordered are listed, but only abnormal results  are displayed)  Labs Reviewed  CBC WITH DIFFERENTIAL/PLATELET - Abnormal; Notable for the following components:      Result Value   RBC 5.94 (*)    Hemoglobin 15.6 (*)    HCT 48.2 (*)    All other components within normal limits  BASIC METABOLIC PANEL - Abnormal; Notable for the following components:   Glucose, Bld 148 (*)    All other components within normal limits  MAGNESIUM   ____________________________________________  EKG  ED ECG REPORT I, Hinda Kehr, the attending physician, personally viewed and interpreted this ECG.  Date: 08/25/2018 EKG Time: 1:18 AM Rate: 108 Rhythm: Mild sinus tachycardia QRS Axis: normal Intervals: normal ST/T Wave abnormalities: normal Narrative Interpretation: no evidence of acute ischemia ____________________________________________  RADIOLOGY   ED MD interpretation: No indication for imaging  Official radiology report(s): No results found.  ____________________________________________   PROCEDURES   Procedure(s)  performed (including Critical Care):  Procedures   ____________________________________________   INITIAL IMPRESSION / MDM / [place] / ED COURSE  As part of my medical decision making, I reviewed the following data within the [place] notes reviewed and incorporated, Labs reviewed , EKG interpreted , Old EKG reviewed, Old chart reviewed, Notes from prior ED visits and [place] [employer]rolled Sub[employer] Bru[place] was evaluated in Emergency Department on 08/25/2018 for the symptoms described in the history of present illness. She was evaluated in the context of the global COVID-19 pandemic, which necessitated consideration that the patient might be at risk for infection with the SARS-CoV-2 virus that causes COVID-19. Institutional protocols and algorithms that pertain to the evaluation of patients at risk for COVID-19 are in a state of rapid change based on information released by regulatory bodies including the CDC and federal and state organizations. These policies and algorithms were followed during the patient's care in the ED.  Some ED evaluations and interventions may be delayed as a result of limited staffing during the pandemic.*  Differential diagnosis includes, but is not limited to, SVT/AVNRT, sinus tachycardia, PE, A. fib with RVR.  The patient has a well-documented history of SVT which is generally well controlled with metoprolol.  It Is unclear the cause of her episode tonight, but she was clearly in SVT upon arrival.  I reviewed the rhythm strip from EMS and her heart rate was 189 and consistent with SVT.  However, as she was being settled into her ED room, I was present in the room and interviewing her when the nurse was establishing IV.  The patient was reacting to the IV, wincing and bearing down and holding her breath, and she converted to sinus rhythm.  She did not require medication.  She calm down significantly after she  converted and I provided reassurance.  She was still mildly tachycardic with sinus tach, but no longer in SVT verified by EKG.  I am checking electrolytes and will give her an extra dose of metoprolol tartrate 25 mg by mouth, but I anticipate a relatively short period of observation in the emergency department with discharge for outpatient follow-up.  Although she had a prior episode of trauma induced lower extremity DVT, she is on Lovenox after her hysterectomy and there is no reason to believe that this is a result of PE induced SVT, particularly since she has recovered well from the surgery, is ambulatory and mobile, and had genetic testing which was negative.  I will give her a liter of  fluids but I think that anxiety is contributing to the sinus tachycardia.  If her labs are reassuring, she is asymptomatic, and her tachycardia resolves, I believe she will be appropriate for discharge and outpatient follow-up.  I will consider CTA chest if her tachycardia persists or if her symptoms persist, but she just had a CTA chest during her hospitalization at [facility] and I am reluctant to scan her every time she has an episode of SVT based on one prior episode of trauma induced lower extremity DVT.  Clinical Course as of Aug 25 454  Wed Aug 25, 2018  0208 Lab work is reassuring.  BMP is within normal limits, magnesium is normal, CBC is notable for an elevated hemoglobin which suggest to me she may be volume depleted.  The liter of fluid should help and I will reassess.   [CF]  3614 The patient is feeling very well and has had no additional symptoms or episodes.  Heart rate was down to 88 when I went to check on her.  Her blood pressure is stable.  I did give her the extra dose of metoprolol 25 mg and she is tolerating it well.  I encouraged her to continue her regular medications starting tomorrow.  I am giving her cardiology follow-up information.  I continue to not be concerned about the possibility of PE and she is  already on Lovenox.  I gave my usual and customary return precautions and she agrees with the plan and is comfortable going home.   [CF]    Clinical Course User Index [CF] Hinda Kehr, MD     ____________________________________________  FINAL CLINICAL IMPRESSION(S) / ED DIAGNOSES  Final diagnoses:  SVT (supraventricular tachycardia) ([place])     MEDICATIONS GIVEN DURING THIS VISIT:  Medications  metoprolol tartrate (LOPRESSOR) tablet 25 mg (25 mg Oral Given 08/25/18 0336)  sodium chloride 0.9 % bolus 1,000 mL (0 mLs Intravenous Stopped 08/25/18 0405)     ED Discharge Orders    None       Note:  This document was prepared using Dragon voice recognition software and may include unintentional dictation errors.   Hinda Kehr, MD 08/25/18 [phone]

## 2018-08-25 NOTE — ED Notes (Signed)
Dr. Karma Greaser at the bedside. Pt placed on monitor and pads.

## 2018-08-25 NOTE — ED Notes (Signed)
Pt up to restroom with steady gait. No distress noted at this time. No complaints. Provided for safety and comfort and will continue to assess.

## 2018-08-25 NOTE — ED Triage Notes (Signed)
Pt arrived from home via EMS with c/o rapid heart rate. Pt was said to have a HR of 189-190 upon  EMS arrival. Hx of the same X3. While starting IV after arriving to ED pt rate changed to 117. MD at the bedside. Denies chest pain or sob.

## 2018-08-25 NOTE — ED Notes (Signed)
Pt resting on stretcher with lights off to enhance rest. No increased work of breathing or acute distress noted. Pt denies pain or sob. HR remains around 100. MD aware.

## 2019-05-12 ENCOUNTER — Emergency Department
Admission: EM | Admit: 2019-05-12 | Discharge: 2019-05-12 | Disposition: A | Discharge status: Home / Self Care | Payer: Self-pay | Attending: Student | Admitting: Student

## 2019-05-12 ENCOUNTER — Other Ambulatory Visit: Payer: Self-pay

## 2019-05-12 DIAGNOSIS — I10 Essential (primary) hypertension: Secondary | ICD-10-CM | POA: Insufficient documentation

## 2019-05-12 DIAGNOSIS — Z79899 Other long term (current) drug therapy: Secondary | ICD-10-CM | POA: Insufficient documentation

## 2019-05-12 LAB — CBC WITH DIFFERENTIAL/PLATELET
Abs Immature Granulocytes: 0.02 10*3/uL (ref 0.00–0.07)
Basophils Absolute: 0 10*3/uL (ref 0.0–0.1)
Basophils Relative: 0 %
Eosinophils Absolute: 0 10*3/uL (ref 0.0–0.5)
Eosinophils Relative: 1 %
HCT: 47.1 % — ABNORMAL HIGH (ref 36.0–46.0)
Hemoglobin: 15.2 g/dL — ABNORMAL HIGH (ref 12.0–15.0)
Immature Granulocytes: 0 %
Lymphocytes Relative: 30 %
Lymphs Abs: 1.5 10*3/uL (ref 0.7–4.0)
MCH: 27.2 pg (ref 26.0–34.0)
MCHC: 32.3 g/dL (ref 30.0–36.0)
MCV: 84.4 fL (ref 80.0–100.0)
Monocytes Absolute: 0.4 10*3/uL (ref 0.1–1.0)
Monocytes Relative: 9 %
Neutro Abs: 2.9 10*3/uL (ref 1.7–7.7)
Neutrophils Relative %: 60 %
Platelets: 309 10*3/uL (ref 150–400)
RBC: 5.58 MIL/uL — ABNORMAL HIGH (ref 3.87–5.11)
RDW: 14 % (ref 11.5–15.5)
WBC: 4.9 10*3/uL (ref 4.0–10.5)
nRBC: 0 % (ref 0.0–0.2)

## 2019-05-12 LAB — COMPREHENSIVE METABOLIC PANEL
ALT: 24 U/L (ref 0–44)
AST: 19 U/L (ref 15–41)
Albumin: 4.3 g/dL (ref 3.5–5.0)
Alkaline Phosphatase: 92 U/L (ref 38–126)
Anion gap: 7 (ref 5–15)
BUN: 9 mg/dL (ref 6–20)
CO2: 25 mmol/L (ref 22–32)
Calcium: 9 mg/dL (ref 8.9–10.3)
Chloride: 105 mmol/L (ref 98–111)
Creatinine, Ser: 0.63 mg/dL (ref 0.44–1.00)
GFR calc Af Amer: >60 mL/min (ref >60)
GFR calc non Af Amer: >60 mL/min (ref >60)
Glucose, Bld: 92 mg/dL (ref 70–99)
Potassium: 3.8 mmol/L (ref 3.5–5.1)
Sodium: 137 mmol/L (ref 135–145)
Total Bilirubin: 0.6 mg/dL (ref 0.3–1.2)
Total Protein: 7.8 g/dL (ref 6.5–8.1)

## 2019-05-12 LAB — TROPONIN I (HIGH SENSITIVITY): Troponin I (High Sensitivity): <2 ng/L (ref <18)

## 2019-05-12 MED ORDER — METOPROLOL SUCCINATE ER 25 MG PO TB24: 25.0000 mg | ORAL_TABLET | Freq: Every day | ORAL | 3 refills | Status: DC

## 2019-05-12 MED ORDER — METOPROLOL TARTRATE 25 MG PO TABS
25.0000 mg | ORAL_TABLET | Freq: Once | ORAL | Status: AC
  Administered 2019-05-12: 25 mg via ORAL
  Filled 2019-05-12: qty 1

## 2019-05-12 MED ORDER — LISINOPRIL-HYDROCHLOROTHIAZIDE 10-12.5 MG PO TABS: 1.0000 | ORAL_TABLET | Freq: Every day | ORAL | 3 refills | Status: DC

## 2019-05-12 MED ORDER — LISINOPRIL 10 MG PO TABS
10.0000 mg | ORAL_TABLET | Freq: Once | ORAL | Status: AC
  Administered 2019-05-12: 10 mg via ORAL
  Filled 2019-05-12: qty 1

## 2019-05-12 MED ORDER — HYDROCHLOROTHIAZIDE 12.5 MG PO CAPS
12.5000 mg | ORAL_CAPSULE | Freq: Every day | ORAL | Status: DC
  Administered 2019-05-12: 12.5 mg via ORAL
  Filled 2019-05-12: qty 1

## 2019-05-12 NOTE — ED Notes (Signed)
Pt out of blood pressure meds for a long time.  Hx htn.    No chest pain or sob.  Pt requesting  Med refill.  Nonsmoker,  Pt alert  Speech clear.

## 2019-05-12 NOTE — ED Provider Notes (Signed)
Emergency Department Provider Note  ____________________________________________  Time seen: Approximately 5:25 PM  I have reviewed the triage vital signs and the nursing notes.   HISTORY  Chief Complaint Hypertension   Historian Patient     HPI Kaitlin Brown is a 54 y.o. female with a history of hypertension and SVT, presents to the emergency department with concern for hypertension and some changes in her upper chest sensation.  Patient states that she has been out of her blood pressure medication for a week.  She denies chest pain, chest tightness, shortness of breath or abdominal pain.  Patient states that she normally takes metoprolol, hydrochlorothiazide and lisinopril.   Past Medical History:  Diagnosis Date  . Anemia due to blood loss, chronic    due to menorrhagia  . Back pain   . Bladder infection   . Chicken pox   . Fibroid uterus   . Hypertension   . SVT (supraventricular tachycardia) ([place])      Immunizations up to date:  Yes.     Past Medical History:  Diagnosis Date  . Anemia due to blood loss, chronic    due to menorrhagia  . Back pain   . Bladder infection   . Chicken pox   . Fibroid uterus   . Hypertension   . SVT (supraventricular tachycardia) [facility])     Patient Active Problem List   Diagnosis Date Noted  . Anemia 07/17/2016  . Anemia due to chronic blood loss 07/16/2016  . Acute pulmonary embolism ([place]) 03/17/2016  . DVT (deep venous thrombosis) ([place]) 03/17/2016  . Fibroid uterus   . Anemia due to blood loss, chronic   . Symptomatic anemia 09/28/2015  . Uterine mass 09/28/2015    Past Surgical History:  Procedure Laterality Date  . ABDOMINAL HYSTERECTOMY    . EMBOLIZATION Bilateral 07/18/2016   Procedure: Embolization, uterine arteries;  Surgeon: Algernon Huxley, MD;  Location: [place] [facility];  Service: Cardiovascular;  Laterality: Bilateral;  . HYSTEROSCOPY WITH D & C  10/2014   [facility], Dr Guadalupe Maple  . none      Prior  to Admission medications   Medication Sig Start Date End Date Taking? Authorizing Provider  acetaminophen (TYLENOL) 325 MG tablet Take by mouth. 08/12/18   [provider]  cyclobenzaprine (FLEXERIL) 5 MG tablet Take 1-2 tablets 3 times daily as needed 03/23/17   Laban Emperor, PA-C  ferrous gluconate (FERGON) 324 MG tablet Take 1 tablet (324 mg total) by mouth daily with breakfast. 09/29/15   Hower, Aaron Mose, MD  hydrOXYzine (ATARAX/VISTARIL) 25 MG tablet Take by mouth. 08/16/18   [provider]  ibuprofen (ADVIL) 600 MG tablet Take by mouth. 08/12/18   [provider]  Iron-Vitamins (GERITOL COMPLETE PO) Take by mouth.    [provider]  lisinopril-hydrochlorothiazide (ZESTORETIC) 10-12.5 MG tablet Take 1 tablet by mouth daily. 05/12/19   Lannie Fields, PA-C  metoprolol succinate (TOPROL-XL) 25 MG 24 hr tablet Take 1 tablet (25 mg total) by mouth daily. 05/12/19 06/11/19  Lannie Fields, PA-C  metoprolol tartrate (LOPRESSOR) 25 MG tablet Take 1 tablet (25 mg total) by mouth 2 (two) times daily. 09/23/17 10/23/17  Carrie Mew, MD  norethindrone (AYGESTIN) 5 MG tablet Take by mouth. 07/26/18 07/26/19  [provider]    Allergies Latex  Family History  Problem Relation Age of Onset  . Hypertension Mother   . Diabetes Mother   . Glaucoma Mother   . Heart disease Father  Congestive heart condition  . Clotting disorder Maternal Grandmother     Social History Social History   Tobacco Use  . Smoking status: Never Smoker  . Smokeless tobacco: Never Used  Substance Use Topics  . Alcohol use: Yes    Comment: occasionally  . Drug use: No     Review of Systems  Constitutional: No fever/chills Eyes:  No discharge ENT: No upper respiratory complaints. Respiratory: no cough. No SOB/ use of accessory muscles to breath Gastrointestinal:   No nausea, no vomiting.  No diarrhea.  No constipation. Musculoskeletal: Negative for musculoskeletal  pain. Skin: Negative for rash, abrasions, lacerations, ecchymosis.    ____________________________________________   PHYSICAL EXAM:  VITAL SIGNS: ED Triage Vitals  Enc Vitals Group     BP 05/12/19 1642 (!) 201/115     Pulse Rate 05/12/19 1642 94     Resp 05/12/19 1642 18     Temp 05/12/19 1642 98.5 F (36.9 C)     Temp Source 05/12/19 1642 Oral     SpO2 --      Weight 05/12/19 1643 230 lb (104.3 kg)     Height 05/12/19 1643 5\' 6"  (1.676 m)     Head Circumference --      Peak Flow --      Pain Score 05/12/19 1643 0     Pain Loc --      Pain Edu? --      Excl. in [place]? --      Constitutional: Alert and oriented. Well appearing and in no acute distress. Eyes: Conjunctivae are normal. PERRL. EOMI. Head: Atraumatic. Neck: No stridor.  No cervical spine tenderness to palpation. Cardiovascular: Normal rate, regular rhythm. Normal S1 and S2.  Good peripheral circulation. Respiratory: Normal respiratory effort without tachypnea or retractions. Lungs CTAB. Good air entry to the bases with no decreased or absent breath sounds Gastrointestinal: Bowel sounds x 4 quadrants. Soft and nontender to palpation. No guarding or rigidity. No distention. Musculoskeletal: Full range of motion to all extremities. No obvious deformities noted Neurologic:  Normal for age. No gross focal neurologic deficits are appreciated.  Skin:  Skin is warm, dry and intact. No rash noted. Psychiatric: Mood and affect are normal for age. Speech and behavior are normal.   ____________________________________________   LABS (all labs ordered are listed, but only abnormal results are displayed)  Labs Reviewed  CBC WITH DIFFERENTIAL/PLATELET - Abnormal; Notable for the following components:      Result Value   RBC 5.58 (*)    Hemoglobin 15.2 (*)    HCT 47.1 (*)    All other components within normal limits  COMPREHENSIVE METABOLIC PANEL  TROPONIN I (HIGH SENSITIVITY)  TROPONIN I (HIGH SENSITIVITY)    ____________________________________________  EKG   ____________________________________________  RADIOLOGY   No results found.  ____________________________________________    PROCEDURES  Procedure(s) performed:     Procedures     Medications  hydrochlorothiazide (MICROZIDE) capsule 12.5 mg (12.5 mg Oral Given 05/12/19 1729)  lisinopril (ZESTRIL) tablet 10 mg (10 mg Oral Given 05/12/19 1729)  metoprolol tartrate (LOPRESSOR) tablet 25 mg (25 mg Oral Given 05/12/19 1729)     ____________________________________________   INITIAL IMPRESSION / ASSESSMENT AND PLAN / ED COURSE  Pertinent labs & imaging results that were available during my care of the patient were reviewed by me and considered in my medical decision making (see chart for details).      Assessment and Plan:  Hypertension 54 year old female presents to the emergency department  with concern for hypertension.  Patient was hypertensive at triage but vital signs were otherwise reassuring.  EKG revealed normal sinus rhythm without ST segment elevation or ischemic changes.  Troponin was within reference range.  Patient's blood pressure trended down with metoprolol, hydrochlorothiazide and lisinopril administered in the emergency department.  Patient was discharged with aforementioned medications and given 3 refills until she can establish care with a primary care provider.  Return precautions were given to return with new or worsening symptoms.  All patient questions were answered.  ____________________________________________  FINAL CLINICAL IMPRESSION(S) / ED DIAGNOSES  Final diagnoses:  Hypertension, unspecified type      NEW MEDICATIONS STARTED DURING THIS VISIT:  ED Discharge Orders         Ordered    lisinopril-hydrochlorothiazide (ZESTORETIC) 10-12.5 MG tablet  Daily     05/12/19 1833    metoprolol succinate (TOPROL-XL) 25 MG 24 hr tablet  Daily     05/12/19 1833               This chart was dictated using voice recognition software/Dragon. Despite best efforts to proofread, errors can occur which can change the meaning. Any change was purely unintentional.     Lannie Fields, PA-C 05/12/19 1850    Lilia Pro., MD 05/12/19 2032

## 2019-05-12 NOTE — ED Triage Notes (Signed)
Pt to the er for htn. Pt out of bp meds and next md visit is 3/15. Pt needs a refill.

## 2019-06-04 ENCOUNTER — Encounter: Payer: Self-pay | Admitting: Emergency Medicine

## 2019-06-04 ENCOUNTER — Other Ambulatory Visit: Payer: Self-pay

## 2019-06-04 ENCOUNTER — Emergency Department
Admission: EM | Admit: 2019-06-04 | Discharge: 2019-06-04 | Disposition: A | Discharge status: Home / Self Care | Payer: Self-pay | Attending: Emergency Medicine | Admitting: Emergency Medicine

## 2019-06-04 DIAGNOSIS — Z86718 Personal history of other venous thrombosis and embolism: Secondary | ICD-10-CM | POA: Insufficient documentation

## 2019-06-04 DIAGNOSIS — Z91040 Latex allergy status: Secondary | ICD-10-CM | POA: Insufficient documentation

## 2019-06-04 DIAGNOSIS — I10 Essential (primary) hypertension: Secondary | ICD-10-CM | POA: Insufficient documentation

## 2019-06-04 DIAGNOSIS — Z79899 Other long term (current) drug therapy: Secondary | ICD-10-CM | POA: Insufficient documentation

## 2019-06-04 DIAGNOSIS — Z86711 Personal history of pulmonary embolism: Secondary | ICD-10-CM | POA: Insufficient documentation

## 2019-06-04 LAB — TROPONIN I (HIGH SENSITIVITY)
Troponin I (High Sensitivity): <2 ng/L (ref <18)
Troponin I (High Sensitivity): <2 ng/L (ref <18)

## 2019-06-04 LAB — BASIC METABOLIC PANEL
Anion gap: 8 (ref 5–15)
BUN: 9 mg/dL (ref 6–20)
CO2: 28 mmol/L (ref 22–32)
Calcium: 9.6 mg/dL (ref 8.9–10.3)
Chloride: 100 mmol/L (ref 98–111)
Creatinine, Ser: 0.59 mg/dL (ref 0.44–1.00)
GFR calc Af Amer: >60 mL/min (ref >60)
GFR calc non Af Amer: >60 mL/min (ref >60)
Glucose, Bld: 127 mg/dL — ABNORMAL HIGH (ref 70–99)
Potassium: 3.7 mmol/L (ref 3.5–5.1)
Sodium: 136 mmol/L (ref 135–145)

## 2019-06-04 LAB — CBC
HCT: 48.3 % — ABNORMAL HIGH (ref 36.0–46.0)
Hemoglobin: 15.4 g/dL — ABNORMAL HIGH (ref 12.0–15.0)
MCH: 27.1 pg (ref 26.0–34.0)
MCHC: 31.9 g/dL (ref 30.0–36.0)
MCV: 84.9 fL (ref 80.0–100.0)
Platelets: 326 10*3/uL (ref 150–400)
RBC: 5.69 MIL/uL — ABNORMAL HIGH (ref 3.87–5.11)
RDW: 13.6 % (ref 11.5–15.5)
WBC: 5.1 10*3/uL (ref 4.0–10.5)
nRBC: 0 % (ref 0.0–0.2)

## 2019-06-04 MED ORDER — ACETAMINOPHEN 500 MG PO TABS
ORAL_TABLET | ORAL | Status: AC
  Filled 2019-06-04: qty 2

## 2019-06-04 MED ORDER — HYDROCHLOROTHIAZIDE 12.5 MG PO CAPS
12.5000 mg | ORAL_CAPSULE | Freq: Every day | ORAL | Status: DC
  Administered 2019-06-04: 12.5 mg via ORAL
  Filled 2019-06-04: qty 1

## 2019-06-04 MED ORDER — ACETAMINOPHEN 500 MG PO TABS
1000.0000 mg | ORAL_TABLET | Freq: Once | ORAL | Status: AC
  Administered 2019-06-04: 1000 mg via ORAL

## 2019-06-04 MED ORDER — DIPHENHYDRAMINE HCL 50 MG/ML IJ SOLN
25.0000 mg | Freq: Once | INTRAMUSCULAR | Status: AC
  Administered 2019-06-04: 25 mg via INTRAVENOUS
  Filled 2019-06-04: qty 1

## 2019-06-04 MED ORDER — METOCLOPRAMIDE HCL 5 MG/ML IJ SOLN
10.0000 mg | Freq: Once | INTRAMUSCULAR | Status: AC
  Administered 2019-06-04: 22:00:00 10 mg via INTRAVENOUS
  Filled 2019-06-04: qty 2

## 2019-06-04 MED ORDER — LISINOPRIL 10 MG PO TABS
10.0000 mg | ORAL_TABLET | Freq: Once | ORAL | Status: DC
  Filled 2019-06-04: qty 1

## 2019-06-04 MED ORDER — METOCLOPRAMIDE HCL 10 MG PO TABS: 10.0000 mg | ORAL_TABLET | Freq: Four times a day (QID) | ORAL | 0 refills | Status: DC | PRN

## 2019-06-04 MED ORDER — AMLODIPINE BESYLATE 5 MG PO TABS
5.0000 mg | ORAL_TABLET | Freq: Once | ORAL | Status: DC
  Filled 2019-06-04: qty 1

## 2019-06-04 NOTE — ED Triage Notes (Signed)
Pt arrived via POV with reports of high blood pressure, pt states she was seen by cardiology yesterday at [facility], states she was started on Amlodipine. Pt currently takes metoprolol and lisinopril as well.

## 2019-06-04 NOTE — Discharge Instructions (Addendum)
Continue your blood pressure medicines   Take reglan as needed for nausea   See your doctor   Return to ER if you have worse headaches, chest pain, trouble breathing

## 2019-06-04 NOTE — ED Provider Notes (Signed)
[facility] EMERGENCY DEPARTMENT Provider Note   CSN: [id number] Arrival date & time: 06/04/19  1726     History Chief Complaint  Patient presents with  . Hypertension    Kaitlin Brown is a 54 y.o. female history of uncontrolled hypertension, anemia, who presenting with headache, hypertension.  Patient states that she just does not feel well for the last several days.  She went to her doctor at [facility] yesterday for hypertension.  Her blood pressure was in the 170s.  She is already taking her hydrochlorothiazide and lisinopril.  She was put on Norvasc yesterday.  She did take 1 dose last night and her medicines this morning.  She states that she woke up and she has headaches.  She states that she commonly get headaches when her blood pressure is elevated.  She took some ibuprofen with no relief.  Denies any trouble speaking denies any weakness.  Denies any history of cerebral aneurysms.  The history is provided by the patient.       Past Medical History:  Diagnosis Date  . Anemia due to blood loss, chronic    due to menorrhagia  . Back pain   . Bladder infection   . Chicken pox   . Fibroid uterus   . Hypertension   . SVT (supraventricular tachycardia) [facility])     Patient Active Problem List   Diagnosis Date Noted  . Anemia 07/17/2016  . Anemia due to chronic blood loss 07/16/2016  . Acute pulmonary embolism ([place]) 03/17/2016  . DVT (deep venous thrombosis) ([place]) 03/17/2016  . Fibroid uterus   . Anemia due to blood loss, chronic   . Symptomatic anemia 09/28/2015  . Uterine mass 09/28/2015    Past Surgical History:  Procedure Laterality Date  . ABDOMINAL HYSTERECTOMY    . EMBOLIZATION Bilateral 07/18/2016   Procedure: Embolization, uterine arteries;  Surgeon: Algernon Huxley, MD;  Location: [facility];  Service: Cardiovascular;  Laterality: Bilateral;  . HYSTEROSCOPY WITH D & C  10/2014   [facility], Dr Guadalupe Maple  . none       OB History    Gravida  2   Para  0   Term  0   Preterm  0   AB  2   Living  0     SAB  2   TAB      Ectopic      Multiple      Live Births              Family History  Problem Relation Age of Onset  . Hypertension Mother   . Diabetes Mother   . Glaucoma Mother   . Heart disease Father        Congestive heart condition  . Clotting disorder Maternal Grandmother     Social History   Tobacco Use  . Smoking status: Never Smoker  . Smokeless tobacco: Never Used  Substance Use Topics  . Alcohol use: Yes    Comment: occasionally  . Drug use: No    Home Medications Prior to Admission medications   Medication Sig Start Date End Date Taking? Authorizing Provider  acetaminophen (TYLENOL) 325 MG tablet Take by mouth. 08/12/18   [provider]  cyclobenzaprine (FLEXERIL) 5 MG tablet Take 1-2 tablets 3 times daily as needed 03/23/17   Laban Emperor, PA-C  ferrous gluconate (FERGON) 324 MG tablet Take 1 tablet (324 mg total) by mouth daily with breakfast. 09/29/15   Hower, Shanon Brow  K, MD  hydrOXYzine (ATARAX/VISTARIL) 25 MG tablet Take by mouth. 08/16/18   [provider]  ibuprofen (ADVIL) 600 MG tablet Take by mouth. 08/12/18   [provider]  Iron-Vitamins (GERITOL COMPLETE PO) Take by mouth.    [provider]  lisinopril-hydrochlorothiazide (ZESTORETIC) 10-12.5 MG tablet Take 1 tablet by mouth daily. 05/12/19   Lannie Fields, PA-C  metoprolol succinate (TOPROL-XL) 25 MG 24 hr tablet Take 1 tablet (25 mg total) by mouth daily. 05/12/19 06/11/19  Lannie Fields, PA-C  metoprolol tartrate (LOPRESSOR) 25 MG tablet Take 1 tablet (25 mg total) by mouth 2 (two) times daily. 09/23/17 10/23/17  Carrie Mew, MD  norethindrone (AYGESTIN) 5 MG tablet Take by mouth. 07/26/18 07/26/19  [provider]    Allergies    Latex  Review of Systems   Review of Systems  Neurological: Positive for headaches.  All other systems reviewed and are  negative.   Physical Exam Updated Vital Signs BP (!) 153/94   Pulse 78   Temp 98.6 F (37 C) (Oral)   Resp 18   Ht 5\' 6"  (1.676 m)   Wt 104.3 kg   LMP 03/23/2017   SpO2 97%   BMI 37.12 kg/m   Physical Exam Vitals and nursing note reviewed.  Constitutional:      Comments: Slightly uncomfortable   HENT:     Head: Normocephalic.     Nose: Nose normal.     Mouth/Throat:     Mouth: Mucous membranes are moist.  Eyes:     Extraocular Movements: Extraocular movements intact.     Pupils: Pupils are equal, round, and reactive to light.  Cardiovascular:     Rate and Rhythm: Normal rate and regular rhythm.     Pulses: Normal pulses.     Heart sounds: Normal heart sounds.  Pulmonary:     Effort: Pulmonary effort is normal.     Breath sounds: Normal breath sounds.  Abdominal:     General: Abdomen is flat.     Palpations: Abdomen is soft.  Musculoskeletal:        General: Normal range of motion.     Cervical back: Normal range of motion.  Skin:    General: Skin is warm.     Capillary Refill: Capillary refill takes less than 2 seconds.  Neurological:     General: No focal deficit present.     Mental Status: She is oriented to person, place, and time.     Comments: CN 2- 12 intact, nl strength and sensation throughout. Nl gait   Psychiatric:        Mood and Affect: Mood normal.     ED Results / Procedures / Treatments   Labs (all labs ordered are listed, but only abnormal results are displayed) Labs Reviewed  BASIC METABOLIC PANEL - Abnormal; Notable for the following components:      Result Value   Glucose, Bld 127 (*)    All other components within normal limits  CBC - Abnormal; Notable for the following components:   RBC 5.69 (*)    Hemoglobin 15.4 (*)    HCT 48.3 (*)    All other components within normal limits  TROPONIN I (HIGH SENSITIVITY)  TROPONIN I (HIGH SENSITIVITY)    EKG EKG Interpretation  Date/Time:  Saturday June 04 2019 18:11:43  EDT Ventricular Rate:  80 PR Interval:  156 QRS Duration: 78 QT Interval:  388 QTC Calculation: 447 R Axis:   21 Text Interpretation:  Normal sinus rhythm Cannot rule out Anterior infarct , age undetermined Abnormal ECG When compared with ECG of 12-May-2019 16:46, QRS axis Shifted right Minimal criteria for Anterior infarct are now Present Nonspecific T wave abnormality, worse in Lateral leads Confirmed by Wandra Arthurs [phone]) on 06/04/2019 8:30:44 PM   Radiology No results found.  Procedures Procedures (including critical care time)  Medications Ordered in ED Medications  hydrochlorothiazide (MICROZIDE) capsule 12.5 mg (12.5 mg Oral Given 06/04/19 2157)  lisinopril (ZESTRIL) tablet 10 mg (10 mg Oral Not Given 06/04/19 2154)  acetaminophen (TYLENOL) tablet 1,000 mg (1,000 mg Oral Given 06/04/19 1943)  acetaminophen (TYLENOL) 500 MG tablet (  Given 06/04/19 2158)  metoCLOPramide (REGLAN) injection 10 mg (10 mg Intravenous Given 06/04/19 2206)  diphenhydrAMINE (BENADRYL) injection 25 mg (25 mg Intravenous Given 06/04/19 2207)    ED Course  I have reviewed the triage vital signs and the nursing notes.  Pertinent labs & imaging results that were available during my care of the patient were reviewed by me and considered in my medical decision making (see chart for details).    MDM Rules/Calculators/A&P                      Kaitlin Brown is a 54 y.o. female presenting with headache.  She has nonfocal neuro exam.  Blood pressure is 170s which is similar to yesterday.  I think she likely has symptomatic hypertension.  Discussed the CT to rule rule out intracranial bleeding.  She would like to just get her blood pressure medicine and migraine cocktail right now and would like to hold off on the CT.  Will get basic blood work to rule out hypertensive urgency as well.  11:10 PM Labs unremarkable. BP down to 150s from 180. Stable for discharge. Likely symptomatic hypertension   Final  Clinical Impression(s) / ED Diagnoses Final diagnoses:  None    Rx / DC Orders ED Discharge Orders    None       Drenda Freeze, MD 06/04/19 2312

## 2019-09-02 ENCOUNTER — Inpatient Hospital Stay
Admission: EM | Admit: 2019-09-02 | Discharge: 2019-09-04 | DRG: 310 | Disposition: A | Discharge status: Home / Self Care | Payer: BLUE CROSS/BLUE SHIELD | Attending: Internal Medicine | Admitting: Internal Medicine

## 2019-09-02 ENCOUNTER — Emergency Department: Payer: BLUE CROSS/BLUE SHIELD

## 2019-09-02 ENCOUNTER — Other Ambulatory Visit: Payer: Self-pay

## 2019-09-02 DIAGNOSIS — Z86718 Personal history of other venous thrombosis and embolism: Secondary | ICD-10-CM

## 2019-09-02 DIAGNOSIS — R0789 Other chest pain: Secondary | ICD-10-CM | POA: Diagnosis present

## 2019-09-02 DIAGNOSIS — I10 Essential (primary) hypertension: Secondary | ICD-10-CM | POA: Diagnosis present

## 2019-09-02 DIAGNOSIS — I471 Supraventricular tachycardia: Secondary | ICD-10-CM | POA: Diagnosis not present

## 2019-09-02 DIAGNOSIS — Z833 Family history of diabetes mellitus: Secondary | ICD-10-CM

## 2019-09-02 DIAGNOSIS — Z90710 Acquired absence of both cervix and uterus: Secondary | ICD-10-CM

## 2019-09-02 DIAGNOSIS — I272 Pulmonary hypertension, unspecified: Secondary | ICD-10-CM | POA: Diagnosis present

## 2019-09-02 DIAGNOSIS — Z8249 Family history of ischemic heart disease and other diseases of the circulatory system: Secondary | ICD-10-CM

## 2019-09-02 DIAGNOSIS — Z832 Family history of diseases of the blood and blood-forming organs and certain disorders involving the immune mechanism: Secondary | ICD-10-CM

## 2019-09-02 DIAGNOSIS — E876 Hypokalemia: Secondary | ICD-10-CM

## 2019-09-02 DIAGNOSIS — Z83511 Family history of glaucoma: Secondary | ICD-10-CM

## 2019-09-02 DIAGNOSIS — I472 Ventricular tachycardia, unspecified: Secondary | ICD-10-CM

## 2019-09-02 DIAGNOSIS — Z91040 Latex allergy status: Secondary | ICD-10-CM

## 2019-09-02 DIAGNOSIS — R778 Other specified abnormalities of plasma proteins: Secondary | ICD-10-CM | POA: Diagnosis present

## 2019-09-02 DIAGNOSIS — D500 Iron deficiency anemia secondary to blood loss (chronic): Secondary | ICD-10-CM | POA: Diagnosis present

## 2019-09-02 DIAGNOSIS — T502X5A Adverse effect of carbonic-anhydrase inhibitors, benzothiadiazides and other diuretics, initial encounter: Secondary | ICD-10-CM | POA: Diagnosis present

## 2019-09-02 DIAGNOSIS — Z79899 Other long term (current) drug therapy: Secondary | ICD-10-CM

## 2019-09-02 DIAGNOSIS — Z20822 Contact with and (suspected) exposure to covid-19: Secondary | ICD-10-CM | POA: Diagnosis present

## 2019-09-02 LAB — CBC WITH DIFFERENTIAL/PLATELET
Abs Immature Granulocytes: 0.02 10*3/uL (ref 0.00–0.07)
Basophils Absolute: 0 10*3/uL (ref 0.0–0.1)
Basophils Relative: 1 %
Eosinophils Absolute: 0.1 10*3/uL (ref 0.0–0.5)
Eosinophils Relative: 1 %
HCT: 41.8 % (ref 36.0–46.0)
Hemoglobin: 13.9 g/dL (ref 12.0–15.0)
Immature Granulocytes: 0 %
Lymphocytes Relative: 40 %
Lymphs Abs: 2.6 10*3/uL (ref 0.7–4.0)
MCH: 28.1 pg (ref 26.0–34.0)
MCHC: 33.3 g/dL (ref 30.0–36.0)
MCV: 84.4 fL (ref 80.0–100.0)
Monocytes Absolute: 0.6 10*3/uL (ref 0.1–1.0)
Monocytes Relative: 9 %
Neutro Abs: 3.2 10*3/uL (ref 1.7–7.7)
Neutrophils Relative %: 49 %
Platelets: 335 10*3/uL (ref 150–400)
RBC: 4.95 MIL/uL (ref 3.87–5.11)
RDW: 13.5 % (ref 11.5–15.5)
WBC: 6.4 10*3/uL (ref 4.0–10.5)
nRBC: 0 % (ref 0.0–0.2)

## 2019-09-02 MED ORDER — AMIODARONE HCL IN DEXTROSE 360-4.14 MG/200ML-% IV SOLN
60.0000 mg/h | INTRAVENOUS | Status: DC
  Administered 2019-09-02 – 2019-09-03 (×3): 60 mg/h via INTRAVENOUS

## 2019-09-02 MED ORDER — AMIODARONE HCL IN DEXTROSE 360-4.14 MG/200ML-% IV SOLN: 30.0000 mg/h | INTRAVENOUS | Status: DC

## 2019-09-02 MED ORDER — DEXTROSE 5 % IV SOLN
INTRAVENOUS | Status: DC | PRN
  Administered 2019-09-02: 150 mg via INTRAVENOUS

## 2019-09-02 MED ORDER — PROCAINAMIDE HCL 100 MG/ML IJ SOLN
1000.0000 mg | Freq: Once | INTRAVENOUS | Status: AC
  Administered 2019-09-03: 1000 mg via INTRAVENOUS
  Filled 2019-09-02: qty 10

## 2019-09-02 MED ORDER — LACTATED RINGERS IV BOLUS
1000.0000 mL | Freq: Once | INTRAVENOUS | Status: AC
  Administered 2019-09-02: 1000 mL via INTRAVENOUS

## 2019-09-02 MED ORDER — MAGNESIUM SULFATE 2 GM/50ML IV SOLN
2.0000 g | Freq: Once | INTRAVENOUS | Status: AC
  Administered 2019-09-02: 2 g via INTRAVENOUS

## 2019-09-02 NOTE — ED Triage Notes (Signed)
Pt BIB EMS, emergency traffic as she was driving home from a friends house she pulled into a police station and EMS was called. EMS states pt was SOB and diaphoretic. EMS states pt was in and out of v-tach for 3 minutes. Pt states she feels so much better and that her SOB resolved. Pt states history of rapid heart rate. EMS states pt resolved to Sinus rhythm.

## 2019-09-02 NOTE — Code Documentation (Signed)
Pt states when she goes into episodes of v-tach she starts to fill dizzy. Pt states she is seen by cardiology. Pt states she has leg pain that comes and goes and that earlier today she had chest pain.

## 2019-09-02 NOTE — Code Documentation (Signed)
150mg  of Amiodarone bolus completed.

## 2019-09-02 NOTE — ED Notes (Signed)
Pt is in v-tach. ER provider at bedside.

## 2019-09-03 ENCOUNTER — Encounter: Payer: Self-pay | Admitting: Family Medicine

## 2019-09-03 ENCOUNTER — Other Ambulatory Visit: Payer: Self-pay

## 2019-09-03 DIAGNOSIS — I472 Ventricular tachycardia, unspecified: Secondary | ICD-10-CM

## 2019-09-03 DIAGNOSIS — Z91040 Latex allergy status: Secondary | ICD-10-CM | POA: Diagnosis not present

## 2019-09-03 DIAGNOSIS — Z83511 Family history of glaucoma: Secondary | ICD-10-CM | POA: Diagnosis not present

## 2019-09-03 DIAGNOSIS — R0789 Other chest pain: Secondary | ICD-10-CM | POA: Diagnosis present

## 2019-09-03 DIAGNOSIS — Z90710 Acquired absence of both cervix and uterus: Secondary | ICD-10-CM | POA: Diagnosis not present

## 2019-09-03 DIAGNOSIS — I10 Essential (primary) hypertension: Secondary | ICD-10-CM

## 2019-09-03 DIAGNOSIS — R778 Other specified abnormalities of plasma proteins: Secondary | ICD-10-CM | POA: Diagnosis present

## 2019-09-03 DIAGNOSIS — Z86718 Personal history of other venous thrombosis and embolism: Secondary | ICD-10-CM | POA: Diagnosis not present

## 2019-09-03 DIAGNOSIS — Z20822 Contact with and (suspected) exposure to covid-19: Secondary | ICD-10-CM | POA: Diagnosis present

## 2019-09-03 DIAGNOSIS — I272 Pulmonary hypertension, unspecified: Secondary | ICD-10-CM | POA: Diagnosis present

## 2019-09-03 DIAGNOSIS — Z833 Family history of diabetes mellitus: Secondary | ICD-10-CM | POA: Diagnosis not present

## 2019-09-03 DIAGNOSIS — E876 Hypokalemia: Secondary | ICD-10-CM

## 2019-09-03 DIAGNOSIS — R9431 Abnormal electrocardiogram [ECG] [EKG]: Secondary | ICD-10-CM | POA: Diagnosis not present

## 2019-09-03 DIAGNOSIS — T502X5A Adverse effect of carbonic-anhydrase inhibitors, benzothiadiazides and other diuretics, initial encounter: Secondary | ICD-10-CM | POA: Diagnosis present

## 2019-09-03 DIAGNOSIS — I471 Supraventricular tachycardia: Principal | ICD-10-CM

## 2019-09-03 DIAGNOSIS — D500 Iron deficiency anemia secondary to blood loss (chronic): Secondary | ICD-10-CM | POA: Diagnosis present

## 2019-09-03 DIAGNOSIS — Z832 Family history of diseases of the blood and blood-forming organs and certain disorders involving the immune mechanism: Secondary | ICD-10-CM | POA: Diagnosis not present

## 2019-09-03 DIAGNOSIS — Z79899 Other long term (current) drug therapy: Secondary | ICD-10-CM | POA: Diagnosis not present

## 2019-09-03 DIAGNOSIS — Z8249 Family history of ischemic heart disease and other diseases of the circulatory system: Secondary | ICD-10-CM | POA: Diagnosis not present

## 2019-09-03 LAB — MRSA PCR SCREENING: MRSA by PCR: NEGATIVE

## 2019-09-03 LAB — COMPREHENSIVE METABOLIC PANEL
ALT: 27 U/L (ref 0–44)
AST: 19 U/L (ref 15–41)
Albumin: 3 g/dL — ABNORMAL LOW (ref 3.5–5.0)
Alkaline Phosphatase: 62 U/L (ref 38–126)
Anion gap: 8 (ref 5–15)
BUN: 9 mg/dL (ref 6–20)
CO2: 20 mmol/L — ABNORMAL LOW (ref 22–32)
Calcium: 6.8 mg/dL — ABNORMAL LOW (ref 8.9–10.3)
Chloride: 112 mmol/L — ABNORMAL HIGH (ref 98–111)
Creatinine, Ser: 0.68 mg/dL (ref 0.44–1.00)
GFR calc Af Amer: >60 mL/min (ref >60)
GFR calc non Af Amer: >60 mL/min (ref >60)
Glucose, Bld: 167 mg/dL — ABNORMAL HIGH (ref 70–99)
Potassium: 2.5 mmol/L — CL (ref 3.5–5.1)
Sodium: 140 mmol/L (ref 135–145)
Total Bilirubin: 0.4 mg/dL (ref 0.3–1.2)
Total Protein: 5.6 g/dL — ABNORMAL LOW (ref 6.5–8.1)

## 2019-09-03 LAB — BASIC METABOLIC PANEL
Anion gap: 8 (ref 5–15)
BUN: 9 mg/dL (ref 6–20)
CO2: 27 mmol/L (ref 22–32)
Calcium: 9 mg/dL (ref 8.9–10.3)
Chloride: 105 mmol/L (ref 98–111)
Creatinine, Ser: 0.82 mg/dL (ref 0.44–1.00)
GFR calc Af Amer: >60 mL/min (ref >60)
GFR calc non Af Amer: >60 mL/min (ref >60)
Glucose, Bld: 157 mg/dL — ABNORMAL HIGH (ref 70–99)
Potassium: 4.6 mmol/L (ref 3.5–5.1)
Sodium: 140 mmol/L (ref 135–145)

## 2019-09-03 LAB — PROTIME-INR
INR: 1 (ref 0.8–1.2)
Prothrombin Time: 13.2 seconds (ref 11.4–15.2)

## 2019-09-03 LAB — MAGNESIUM
Magnesium: 1.5 mg/dL — ABNORMAL LOW (ref 1.7–2.4)
Magnesium: 2.1 mg/dL (ref 1.7–2.4)

## 2019-09-03 LAB — TROPONIN I (HIGH SENSITIVITY)
Troponin I (High Sensitivity): 169 ng/L (ref <18)
Troponin I (High Sensitivity): 6 ng/L (ref <18)

## 2019-09-03 LAB — CBC
HCT: 44.4 % (ref 36.0–46.0)
Hemoglobin: 15.2 g/dL — ABNORMAL HIGH (ref 12.0–15.0)
MCH: 28.2 pg (ref 26.0–34.0)
MCHC: 34.2 g/dL (ref 30.0–36.0)
MCV: 82.4 fL (ref 80.0–100.0)
Platelets: 351 10*3/uL (ref 150–400)
RBC: 5.39 MIL/uL — ABNORMAL HIGH (ref 3.87–5.11)
RDW: 13.2 % (ref 11.5–15.5)
WBC: 6.9 10*3/uL (ref 4.0–10.5)
nRBC: 0 % (ref 0.0–0.2)

## 2019-09-03 LAB — T4, FREE: Free T4: 0.83 ng/dL (ref 0.61–1.12)

## 2019-09-03 LAB — TSH: TSH: 0.985 u[IU]/mL (ref 0.350–4.500)

## 2019-09-03 LAB — APTT: aPTT: 45 seconds — ABNORMAL HIGH (ref 24–36)

## 2019-09-03 LAB — HIV ANTIBODY (ROUTINE TESTING W REFLEX): HIV Screen 4th Generation wRfx: NONREACTIVE

## 2019-09-03 LAB — HEPARIN LEVEL (UNFRACTIONATED)
Heparin Unfractionated: 0.2 IU/mL — ABNORMAL LOW (ref 0.30–0.70)
Heparin Unfractionated: 0.21 IU/mL — ABNORMAL LOW (ref 0.30–0.70)

## 2019-09-03 LAB — SARS CORONAVIRUS 2 BY RT PCR (HOSPITAL ORDER, PERFORMED IN ~~LOC~~ HOSPITAL LAB): SARS Coronavirus 2: NEGATIVE

## 2019-09-03 LAB — GLUCOSE, CAPILLARY: Glucose-Capillary: 88 mg/dL (ref 70–99)

## 2019-09-03 MED ORDER — AMIODARONE IV BOLUS ONLY 150 MG/100ML
INTRAVENOUS | Status: AC
  Filled 2019-09-03: qty 200

## 2019-09-03 MED ORDER — ADENOSINE 6 MG/2ML IV SOLN
INTRAVENOUS | Status: AC
  Filled 2019-09-03: qty 2

## 2019-09-03 MED ORDER — AMIODARONE HCL IN DEXTROSE 360-4.14 MG/200ML-% IV SOLN
30.0000 mg/h | INTRAVENOUS | Status: DC
  Administered 2019-09-03: 30 mg/h via INTRAVENOUS
  Filled 2019-09-03: qty 200

## 2019-09-03 MED ORDER — AMIODARONE HCL IN DEXTROSE 360-4.14 MG/200ML-% IV SOLN
60.0000 mg/h | INTRAVENOUS | Status: DC
  Administered 2019-09-03: 60 mg/h via INTRAVENOUS

## 2019-09-03 MED ORDER — METOPROLOL SUCCINATE ER 25 MG PO TB24
25.0000 mg | ORAL_TABLET | Freq: Every day | ORAL | Status: DC
  Administered 2019-09-03 – 2019-09-04 (×2): 25 mg via ORAL
  Filled 2019-09-03 (×2): qty 1

## 2019-09-03 MED ORDER — ONDANSETRON HCL 4 MG PO TABS: 4.0000 mg | ORAL_TABLET | Freq: Four times a day (QID) | ORAL | Status: DC | PRN

## 2019-09-03 MED ORDER — POTASSIUM CHLORIDE CRYS ER 20 MEQ PO TBCR: 40.0000 meq | EXTENDED_RELEASE_TABLET | Freq: Once | ORAL | Status: DC

## 2019-09-03 MED ORDER — ADENOSINE 12 MG/4ML IV SOLN
INTRAVENOUS | Status: AC
  Filled 2019-09-03: qty 4

## 2019-09-03 MED ORDER — HEPARIN BOLUS VIA INFUSION
1200.0000 [IU] | Freq: Once | INTRAVENOUS | Status: AC
  Administered 2019-09-03: 1200 [IU] via INTRAVENOUS
  Filled 2019-09-03: qty 1200

## 2019-09-03 MED ORDER — CHLORHEXIDINE GLUCONATE CLOTH 2 % EX PADS
6.0000 | MEDICATED_PAD | Freq: Every day | CUTANEOUS | Status: DC
  Administered 2019-09-04: 6 via TOPICAL
  Filled 2019-09-03: qty 6

## 2019-09-03 MED ORDER — POTASSIUM CHLORIDE 20 MEQ PO PACK
40.0000 meq | PACK | ORAL | Status: AC
  Administered 2019-09-03 (×3): 40 meq via ORAL
  Filled 2019-09-03 (×3): qty 2

## 2019-09-03 MED ORDER — ADENOSINE 6 MG/2ML IV SOLN
INTRAVENOUS | Status: DC | PRN
  Administered 2019-09-03: 6 mg via INTRAVENOUS

## 2019-09-03 MED ORDER — CALCIUM GLUCONATE-NACL 1-0.675 GM/50ML-% IV SOLN
1.0000 g | Freq: Once | INTRAVENOUS | Status: AC
  Administered 2019-09-03: 1000 mg via INTRAVENOUS
  Filled 2019-09-03: qty 50

## 2019-09-03 MED ORDER — AMLODIPINE BESYLATE 5 MG PO TABS
5.0000 mg | ORAL_TABLET | Freq: Every day | ORAL | Status: DC
  Administered 2019-09-03 – 2019-09-04 (×2): 5 mg via ORAL
  Filled 2019-09-03 (×2): qty 1

## 2019-09-03 MED ORDER — ETOMIDATE 2 MG/ML IV SOLN
INTRAVENOUS | Status: AC
  Filled 2019-09-03: qty 10

## 2019-09-03 MED ORDER — HEPARIN (PORCINE) 25000 UT/250ML-% IV SOLN
1250.0000 [IU]/h | INTRAVENOUS | Status: DC
  Administered 2019-09-03: 1100 [IU]/h via INTRAVENOUS
  Filled 2019-09-03: qty 250

## 2019-09-03 MED ORDER — SODIUM CHLORIDE 0.9 % IV SOLN: INTRAVENOUS | Status: DC

## 2019-09-03 MED ORDER — MAGNESIUM HYDROXIDE 400 MG/5ML PO SUSP: 30.0000 mL | Freq: Every day | ORAL | Status: DC | PRN

## 2019-09-03 MED ORDER — ACETAMINOPHEN 650 MG RE SUPP: 650.0000 mg | Freq: Four times a day (QID) | RECTAL | Status: DC | PRN

## 2019-09-03 MED ORDER — HYDROCODONE-ACETAMINOPHEN 5-325 MG PO TABS
1.0000 | ORAL_TABLET | Freq: Four times a day (QID) | ORAL | Status: DC | PRN
  Administered 2019-09-04 (×2): 1 via ORAL
  Filled 2019-09-03 (×2): qty 1

## 2019-09-03 MED ORDER — POTASSIUM CHLORIDE 10 MEQ/100ML IV SOLN
10.0000 meq | INTRAVENOUS | Status: DC
  Administered 2019-09-03 (×2): 10 meq via INTRAVENOUS
  Filled 2019-09-03 (×2): qty 100

## 2019-09-03 MED ORDER — ENOXAPARIN SODIUM 120 MG/0.8ML ~~LOC~~ SOLN
1.0000 mg/kg | Freq: Two times a day (BID) | SUBCUTANEOUS | Status: DC
  Filled 2019-09-03: qty 0.8

## 2019-09-03 MED ORDER — ACETAMINOPHEN 325 MG PO TABS
650.0000 mg | ORAL_TABLET | Freq: Four times a day (QID) | ORAL | Status: DC | PRN
  Administered 2019-09-03 (×2): 650 mg via ORAL
  Filled 2019-09-03 (×2): qty 2

## 2019-09-03 MED ORDER — HEPARIN BOLUS VIA INFUSION
4000.0000 [IU] | Freq: Once | INTRAVENOUS | Status: AC
  Administered 2019-09-03: 4000 [IU] via INTRAVENOUS
  Filled 2019-09-03: qty 4000

## 2019-09-03 MED ORDER — TRAZODONE HCL 50 MG PO TABS: 25.0000 mg | ORAL_TABLET | Freq: Every evening | ORAL | Status: DC | PRN

## 2019-09-03 MED ORDER — ENOXAPARIN SODIUM 40 MG/0.4ML ~~LOC~~ SOLN: 40.0000 mg | Freq: Two times a day (BID) | SUBCUTANEOUS | Status: DC

## 2019-09-03 MED ORDER — POTASSIUM CHLORIDE CRYS ER 20 MEQ PO TBCR
40.0000 meq | EXTENDED_RELEASE_TABLET | Freq: Once | ORAL | Status: AC
  Administered 2019-09-03: 40 meq via ORAL
  Filled 2019-09-03: qty 2

## 2019-09-03 MED ORDER — ONDANSETRON HCL 4 MG/2ML IJ SOLN: 4.0000 mg | Freq: Four times a day (QID) | INTRAMUSCULAR | Status: DC | PRN

## 2019-09-03 NOTE — ED Notes (Signed)
Date and time results received: 09/03/19 0034 (use smartphrase ".now" to insert current time)  Test: Potassium Critical Value: 2.5  Name of Provider Notified: Alfred Levins, MD ER provider at bedside.   Orders Received? Or Actions Taken?: provider at bedside

## 2019-09-03 NOTE — ED Notes (Signed)
As per ER provider another 100mg  of procainamide given as a bolus at 960mL/hr

## 2019-09-03 NOTE — ED Notes (Signed)
Procainamide gtt changed to 6mg /min as per ER provider

## 2019-09-03 NOTE — H&P (Addendum)
[facility] [place] NAME: Kaitlin Brown    MR#:  [id number]  DATE OF BIRTH:  25-Jun-1965  DATE OF ADMISSION:  09/02/2019  PRIMARY CARE PHYSICIAN: System, Pcp Not In   REQUESTING/REFERRING PHYSICIAN: Gonzella Lex, MD  CHIEF COMPLAINT:   Chief Complaint  Patient presents with  . Tachycardia    HISTORY OF PRESENT ILLNESS:  Kaitlin Brown  is a 54 y.o. African-American female with a known history of hypertension and SVT, who presented to the emergency room with the onset of sharp left-sided chest pain with associated palpitations without dyspnea or nausea or vomiting or cough or wheezing.  She denies any fever or chills.  She has been having mild heartburn.  No headache or dizziness or blurred vision.  No paresthesias or focal muscle weakness.  No dysuria, oliguria or hematuria or flank pain.  Upon presentation to the emergency room, heart rate was 103 and vital signs were otherwise within normal.  Labs revealed hypokalemia of 2.5 and hypomagnesemia of 1.5 with a calcium level of 6.8 and albumin of 3.  CBC was within normal and high-sensitivity troponin I was 6.  Portable chest x-ray showed mild pulmonary vascular congestion.  EKG showed3.  Labs EKG showed sinus tachycardia with a rate of 100 with right axis deviation. Repeat EKG showed junctional tachycardia with rate 144. Latest EKG showed sinus tachycardia with a rate of 109.  The patient was given an amiodarone bolus and later on developed SVT for which she was given IV adenosine.  She she later converted to sinus tachycardia and specially after replacement of potassium and magnesium as well as calcium.  She will be admitted to a stepdown unit for further evaluation and management. PAST MEDICAL HISTORY:   Past Medical History:  Diagnosis Date  . Anemia due to blood loss, chronic    due to menorrhagia  . Back pain   . Bladder infection   . Chicken pox   . Fibroid uterus   . Hypertension   .  SVT (supraventricular tachycardia) ([place])     PAST SURGICAL HISTORY:   Past Surgical History:  Procedure Laterality Date  . ABDOMINAL HYSTERECTOMY    . EMBOLIZATION Bilateral 07/18/2016   Procedure: Embolization, uterine arteries;  Surgeon: Algernon Huxley, MD;  Location: [place] [facility];  Service: Cardiovascular;  Laterality: Bilateral;  . HYSTEROSCOPY WITH D & C  10/2014   [facility], Dr Guadalupe Maple  . none      SOCIAL HISTORY:   Social History   Tobacco Use  . Smoking status: Never Smoker  . Smokeless tobacco: Never Used  Substance Use Topics  . Alcohol use: Yes    Comment: occasionally    FAMILY HISTORY:   Family History  Problem Relation Age of Onset  . Hypertension Mother   . Diabetes Mother   . Glaucoma Mother   . Heart disease Father        Congestive heart condition  . Clotting disorder Maternal Grandmother     DRUG ALLERGIES:   Allergies  Allergen Reactions  . Latex Itching    REVIEW OF SYSTEMS:   ROS As per history of present illness. All pertinent systems were reviewed above. Constitutional,  HEENT, cardiovascular, respiratory, GI, GU, musculoskeletal, neuro, psychiatric, endocrine,  integumentary and hematologic systems were reviewed and are otherwise  negative/unremarkable except for positive findings mentioned above in the HPI.   MEDICATIONS AT HOME:   Prior to Admission medications   Medication Sig Start Date  End Date Taking? Authorizing Provider  amLODipine (NORVASC) 5 MG tablet Take 5 mg by mouth daily. 08/04/19  Yes [provider]  HYDROcodone-acetaminophen (NORCO/VICODIN) 5-325 MG tablet Take 1 tablet by mouth 3 (three) times daily as needed. 08/26/19  Yes [provider]  lisinopril-hydrochlorothiazide (ZESTORETIC) 10-12.5 MG tablet Take 1 tablet by mouth daily. 05/12/19  Yes Vallarie Mare M, PA-C  metoprolol succinate (TOPROL-XL) 25 MG 24 hr tablet Take 1 tablet (25 mg total) by mouth daily. 05/12/19 09/03/19 Yes Vallarie Mare M,  PA-C      VITAL SIGNS:  Blood pressure 126/83, pulse 94, temperature 98.1 F (36.7 C), resp. rate 20, height 5\' 6"  (1.676 m), weight 113.4 kg, last menstrual period 03/23/2017, SpO2 97 %.  PHYSICAL EXAMINATION:  Physical Exam  GENERAL:  54 y.o.-year-old African-American female patient lying in the bed with no acute distress.  EYES: Pupils equal, round, reactive to light and accommodation. No scleral icterus. Extraocular muscles intact.  HEENT: Head atraumatic, normocephalic. Oropharynx and nasopharynx clear.  NECK:  Supple, no jugular venous distention. No thyroid enlargement, no tenderness.  LUNGS: Normal breath sounds bilaterally, no wheezing, rales,rhonchi or crepitation. No use of accessory muscles of respiration.  CARDIOVASCULAR: Regular rate and rhythm, S1, S2 normal. No murmurs, rubs, or gallops.  ABDOMEN: Soft, nondistended, nontender. Bowel sounds present. No organomegaly or mass.  EXTREMITIES: No pedal edema, cyanosis, or clubbing.  NEUROLOGIC: Cranial nerves II through XII are intact. Muscle strength 5/5 in all extremities. Sensation intact. Gait not checked.  PSYCHIATRIC: The patient is alert and oriented x 3.  Normal affect and good eye contact. SKIN: No obvious rash, lesion, or ulcer.   LABORATORY PANEL:   CBC Recent Labs  Lab 09/02/19 2341  WBC 6.4  HGB 13.9  HCT 41.8  PLT 335   ------------------------------------------------------------------------------------------------------------------  Chemistries  Recent Labs  Lab 09/02/19 2341  NA 140  K 2.5*  CL 112*  CO2 20*  GLUCOSE 167*  BUN 9  CREATININE 0.68  CALCIUM 6.8*  MG 1.5*  AST 19  ALT 27  ALKPHOS 62  BILITOT 0.4   ------------------------------------------------------------------------------------------------------------------  Cardiac Enzymes No results for input(s): TROPONINI in the last 168  hours. ------------------------------------------------------------------------------------------------------------------  RADIOLOGY:  DG Chest Portable 1 View  Result Date: 09/03/2019 CLINICAL DATA:  Chest pain EXAM: PORTABLE CHEST 1 VIEW COMPARISON:  September 17, 2017 FINDINGS: The heart size and mediastinal contours are within normal limits. There is prominence of the central pulmonary vasculature. The visualized skeletal structures are unremarkable. IMPRESSION: Mild pulmonary vascular congestion. Electronically Signed   By: Prudencio Pair M.D.   On: 09/03/2019 00:31      IMPRESSION AND PLAN:  1.  Nonsustained ventricular tachycardia and brief SVT.  She currently has elevated troponin I, now up to 169 from 6. -The patient will be admitted to a stepdown unit bed. -We will continue IV amiodarone drip. -Obtain a 2D echo and cardiology consultation in a.m. -Dr. Rockey Situ was notified about the patient. -We will follow serial troponin I's. -We will place her on IV heparin pending further troponin I's.  2.  Hypokalemia. -This is clearly contributing to #1. -Potassium will be aggressively replaced.  3.  Hypomagnesemia. -Magnesium will be replaced. -It is also contributing to #1.  4.  Hypocalcemia. -The patient received IV calcium gluconate. -Calcium level will be repeated in a.m.  5.  Hypertension. -We will continue Toprol-XL and amlodipine.  We will hold off lisinopril HCT for now.  6.  DVT prophylaxis. -The patient will  be on IV heparin.  All the records are reviewed and case discussed with ED provider. The plan of care was discussed in details with the patient (and family). I answered all questions. The patient agreed to proceed with the above mentioned plan. Further management will depend upon hospital course.   CODE STATUS: Full code  Status is: Inpatient  Remains inpatient appropriate because:Persistent severe electrolyte disturbances, Ongoing diagnostic testing needed not  appropriate for outpatient work up, Unsafe d/c plan, IV treatments appropriate due to intensity of illness or inability to take PO and Inpatient level of care appropriate due to severity of illness   Dispo: The patient is from: Home              Anticipated d/c is to: Home              Anticipated d/c date is: 2 days              Patient currently is not medically stable to d/c.  TOTAL TIME TAKING CARE OF THIS PATIENT: 55 minutes.    Christel Mormon M.D on 09/03/2019 at 1:42 AM  [facility]   From 7 PM-7 AM, contact night-coverage [email or web address]  CC: Primary care physician; System, Pcp Not In   Note: This dictation was prepared with Dragon dictation along with smaller phrase technology. Any transcriptional typo errors that result from this process are unintentional.

## 2019-09-03 NOTE — ED Notes (Signed)
Pt was able to walk to bathroom and back. Pt states she felt fine. RN was with pt the entire time.

## 2019-09-03 NOTE — Progress Notes (Signed)
ANTICOAGULATION CONSULT NOTE - Initial Consult  Pharmacy Consult for heparin Indication: chest pain/ACS  Allergies  Allergen Reactions  . Latex Itching    Patient Measurements: Height: 5\' 6"  (167.6 cm) Weight: 113.3 kg (249 lb 12.5 oz) IBW/kg (Calculated) : 59.3  Heparin dosing weight: 81 kg  Vital Signs: Temp: 98.2 F (36.8 C) (06/19 0800) Temp Source: Oral (06/19 0800) BP: 119/79 (06/19 1157) Pulse Rate: 71 (06/19 1157)  Labs: Recent Labs    09/02/19 2341 09/03/19 0146 09/03/19 0403 09/03/19 1225  HGB 13.9  --  15.2*  --   HCT 41.8  --  44.4  --   PLT 335  --  351  --   APTT  --   --  45*  --   LABPROT 13.2  --   --   --   INR 1.0  --   --   --   HEPARINUNFRC  --   --   --  0.20*  CREATININE 0.68  --  0.82  --   TROPONINIHS 6 169*  --   --     Estimated Creatinine Clearance: 103.7 mL/min (by C-G formula based on SCr of 0.82 mg/dL).   Medical History: Past Medical History:  Diagnosis Date  . Anemia due to blood loss, chronic    due to menorrhagia  . Back pain   . Bladder infection   . Chicken pox   . Fibroid uterus   . Hypertension   . SVT (supraventricular tachycardia) ([facility])     Medications:  Scheduled:  . amLODipine  5 mg Oral Daily  . Chlorhexidine Gluconate Cloth  6 each Topical Daily  . metoprolol succinate  25 mg Oral Daily    Assessment: Patient w/ h/o HTN, SVT, and chronic anemia admitted for tachycardia w/ runs of V-tach in and out, was placed on amiodarone and converted to NSR, then went back into V-tach, placed on procainamide converted again to NSR, then went back into V-tach where then she was cardioverted w/ adenosine back into NSR. Troponin has rose 6 >> 169, EKG showing minimal ST abnormalities w/ r axis deviation then SVT in following EKG and finally NSR in sinus tach in most recent EKG. No anticoagulation PTA,. Patient is being started on heparin for anticoagulation of NSTEMI.  6/19 1225 HL 0.2   Goal of Therapy:  Heparin level:  0.3 - 0.7 IU/mL Monitor platelets by anticoagulation protocol: Yes   Plan:  Heparin level is subtherapeutic. Will give a 1200 unit heparin bolus. Will increase heparin infusion to 1250 units/hr. Will check anti-xa level in 6 hours. CBC daily while on heparin.   Eleonore Chiquito, PharmD, BCPS Clinical Pharmacist 09/03/2019,1:17 PM

## 2019-09-03 NOTE — Progress Notes (Deleted)
ANTICOAGULATION CONSULT NOTE - Initial Consult  Pharmacy Consult for heparin Indication: chest pain/ACS  Allergies  Allergen Reactions   Latex Itching    Patient Measurements: Height: 5\' 6"  (119.1 cm) Weight: 113.3 kg (249 lb 12.5 oz) IBW/kg (Calculated) : 59.3  Heparin dosing weight: 81 kg  Vital Signs: Temp: 97.8 F (36.6 C) (06/19 2030) Temp Source: Oral (06/19 2030) BP: 126/90 (06/19 2030) Pulse Rate: 68 (06/19 2030)  Labs: Recent Labs    09/02/19 2341 09/03/19 0146 09/03/19 0403 09/03/19 1225 09/03/19 1933  HGB 13.9  --  15.2*  --   --   HCT 41.8  --  44.4  --   --   PLT 335  --  351  --   --   APTT  --   --  45*  --   --   LABPROT 13.2  --   --   --   --   INR 1.0  --   --   --   --   HEPARINUNFRC  --   --   --  0.20* 0.21*  CREATININE 0.68  --  0.82  --   --   TROPONINIHS 6 169*  --   --   --     Estimated Creatinine Clearance: 103.7 mL/min (by C-G formula based on SCr of 0.82 mg/dL).   Medical History: Past Medical History:  Diagnosis Date   Anemia due to blood loss, chronic    due to menorrhagia   Back pain    Bladder infection    Chicken pox    Fibroid uterus    Hypertension    SVT (supraventricular tachycardia) ([facility])     Medications:  Scheduled:   amLODipine  5 mg Oral Daily   Chlorhexidine Gluconate Cloth  6 each Topical Daily   metoprolol succinate  25 mg Oral Daily    Assessment: Patient w/ h/o HTN, SVT, and chronic anemia admitted for tachycardia w/ runs of V-tach in and out, was placed on amiodarone and converted to NSR, then went back into V-tach, placed on procainamide converted again to NSR, then went back into V-tach where then she was cardioverted w/ adenosine back into NSR. Troponin has rose 6 >> 169, EKG showing minimal ST abnormalities w/ r axis deviation then SVT in following EKG and finally NSR in sinus tach in most recent EKG. No anticoagulation PTA.   Patient is being started on heparin for anticoagulation of  NSTEMI.  Started patient with 4000 unit bolus, followed by 1100 units/hr  6/19 1225 HL 0.2 - subtherapeutic - gave 1200 unit heparin bolus incr rate to 1250 units/hr 6/19 1933 HL 0.21  Goal of Therapy:  Heparin level: 0.3 - 0.7 IU/mL Monitor platelets by anticoagulation protocol: Yes   Plan:  Heparin level 0.21 is subtherapeutic.   Will give a 2000 unit heparin bolus. Will increase heparin infusion to 1400 units/hr.   Will check anti-xa level in 6 hours. CBC daily while on heparin.   Lu Duffel, PharmD, BCPS Clinical Pharmacist 09/03/2019 10:35 PM

## 2019-09-03 NOTE — ED Notes (Signed)
Pt taken to floor with RN and tech. Pt on bed and monitor is on.

## 2019-09-03 NOTE — Code Documentation (Signed)
Pt is now in sinus tach. All medications have stopped except the LR bolus. Pt is resting in room. Pt states she is feeling better. Pt is still hooked up to zoll

## 2019-09-03 NOTE — Progress Notes (Addendum)
ANTICOAGULATION CONSULT NOTE - Initial Consult  Pharmacy Consult for heparin Indication: chest pain/ACS  Allergies  Allergen Reactions  . Latex Itching    Patient Measurements: Height: 5\' 6"  (167.6 cm) Weight: 113.3 kg (249 lb 12.5 oz) IBW/kg (Calculated) : 59.3  Heparin dosing weight: 81 kg  Vital Signs: Temp: 97.8 F (36.6 C) (06/19 0250) Temp Source: Oral (06/19 0250) BP: 133/64 (06/19 0250) Pulse Rate: 94 (06/19 0250)  Labs: Recent Labs    09/02/19 2341 09/03/19 0146  HGB 13.9  --   HCT 41.8  --   PLT 335  --   LABPROT 13.2  --   INR 1.0  --   CREATININE 0.68  --   TROPONINIHS 6 169*    Estimated Creatinine Clearance: 106.3 mL/min (by C-G formula based on SCr of 0.68 mg/dL).   Medical History: Past Medical History:  Diagnosis Date  . Anemia due to blood loss, chronic    due to menorrhagia  . Back pain   . Bladder infection   . Chicken pox   . Fibroid uterus   . Hypertension   . SVT (supraventricular tachycardia) (HCC)     Medications:  Scheduled:  . adenosine      . adenosine      . amLODipine  5 mg Oral Daily  . Chlorhexidine Gluconate Cloth  6 each Topical Daily  . enoxaparin (LOVENOX) injection  1 mg/kg Subcutaneous Q12H  . metoprolol succinate  25 mg Oral Daily  . potassium chloride  40 mEq Oral Q30 min    Assessment: Patient w/ h/o HTN, SVT, and chronic anemia admitted for tachycardia w/ runs of V-tach in and out, was placed on amiodarone and converted to NSR, then went back into V-tach, placed on procainamide converted again to NSR, then went back into V-tach where then she was cardioverted w/ adenosine back into NSR. Troponin has rose 6 >> 169, EKG showing minimal ST abnormalities w/ r axis deviation then SVT in following EKG and finally NSR in sinus tach in most recent EKG. No anticoagulation PTA, baseline labs WNL, aPTT pending. Patient is being started on heparin for anticoagulation of NSTEMI.  Goal of Therapy:  Heparin level: 0.3 -  0.7 IU/mL Monitor platelets by anticoagulation protocol: Yes   Plan:  Will bolus heparin 4000 units IV x 1. Will start heparin rate at 1100 units/hr. Will check anti-Xa level at 1000. Will monitor daily CBC's and will adjust dose based on anti-Xa levels.  Tobie Lords, PharmD, BCPS Clinical Pharmacist 09/03/2019,3:18 AM

## 2019-09-03 NOTE — Plan of Care (Signed)
  Problem: Education: Goal: Knowledge of General Education information will improve Description: Including pain rating scale, medication(s)/side effects and non-pharmacologic comfort measures Outcome: Progressing   Problem: Clinical Measurements: Goal: Cardiovascular complication will be avoided Outcome: Progressing   Problem: Safety: Goal: Ability to remain free from injury will improve Outcome: Progressing   Problem: Clinical Measurements: Goal: Ability to maintain clinical measurements within normal limits will improve Outcome: Progressing

## 2019-09-03 NOTE — ED Notes (Addendum)
Dr. Sidney Ace notified that pt was not tolerating IV potassium and he stated that he will change the order to PO. Beth Buono notified via secure chat and Doroteo Bradford in ICU notified

## 2019-09-03 NOTE — Progress Notes (Signed)
PROGRESS NOTE    Kaitlin Brown  ZHY:[id number] DOB: 10-23-1965 DOA: 09/02/2019 PCP: System, Pcp Not In   Chief complaint.  Palpitation and chest pain.  Brief Narrative:  Kaitlin Brown  is a 54 y.o. African-American female with a known history of hypertension and SVT, who presented to the emergency room with the onset of sharp left-sided chest pain with associated palpitations without dyspnea or nausea or vomiting or cough or wheezing.  She had  3 prior episodes. Upon arrival in the emergency room, chest x-ray showed mild pulmonary vascular congestion, EKG showed supraventricular tachycardia of 144. Troponin 169. Patient was started on amiodarone drip.  She has converted to sinus rhythm.   Assessment & Plan:   Active Problems:   Ventricular tachycardia ([place])  #1.  PSVT. I have reviewed all the strips scanned in the computer and EKG in the medical chart.  There is an episode of supraventricular tachycardia.  Did not see an episode with ventricular tachycardia.  Patient states that she had 3 prior episodes.  In 1 one episode, she was told by her cardiologist that " the heart beats initiated from the top of the heart, had a slow conduction".  Question if she had WPW.  Current episode was triggered by hypokalemia.  Potassium level has normalized.  She did not have recurrence of arrhythmia.  I will transfer patient to the progressive unit and monitor overnight.  She will be seen by cardiology.  Echocardiogram is pending.  She is also on beta-blocker.  #2.  Hypokalemia. Caused by recent diarrhea and hydrochlorothiazide.  I may discontinue her hydrochlorothiazide at discharge.  #3.  Hypomagnesemia. Recheck level tomorrow.  #4 essential hypertension. Continue home medicine except hydrochlorothiazide.  #5.  Elevated troponin. Continue heparin drip and beta-blocker.  Patient will be seen by cardiology.     DVT prophylaxis: Heparin drip Code Status: Full Family Communication:  None Disposition Plan:  . Patient came from: Home           . Anticipated d/c place: Home . Barriers to d/c OR conditions which need to be met to effect a safe d/c:   Consultants:   Cardiology   Procedures:None  Antimicrobials: None  Subjective: Patient doing well today.  No additional chest pain or palpitation.  No nausea vomiting. No short of breath or cough.  Objective: Vitals:   09/03/19 0800 09/03/19 0900 09/03/19 1000 09/03/19 1157  BP: (!) 137/94 (!) 134/91 122/87 119/79  Pulse: 72 74 74 71  Resp: 12 (!) 23 15   Temp: 98.2 F (36.8 C)     TempSrc: Oral     SpO2: 97% 100% 93%   Weight:      Height:        Intake/Output Summary (Last 24 hours) at 09/03/2019 1415 Last data filed at 09/03/2019 1041 Gross per 24 hour  Intake 660.89 ml  Output 500 ml  Net 160.89 ml   Filed Weights   09/02/19 2332 09/03/19 0250  Weight: 113.4 kg 113.3 kg    Examination:  General exam: Appears calm and comfortable morbid obese. Respiratory system: Clear to auscultation. Respiratory effort normal. Cardiovascular system: S1 & S2 heard, RRR. No JVD, murmurs, rubs, gallops or clicks. No pedal edema. Gastrointestinal system: Abdomen is nondistended, soft and nontender. No organomegaly or masses felt. Normal bowel sounds heard. Central nervous system: Alert and oriented. No focal neurological deficits. Extremities: Symmetric 5 x 5 power. Skin: No rashes, lesions or ulcers Psychiatry: Judgement and insight appear normal. Mood &  affect appropriate.     Data Reviewed: I have personally reviewed following labs and imaging studies  CBC: Recent Labs  Lab 09/02/19 2341 09/03/19 0403  WBC 6.4 6.9  NEUTROABS 3.2  --   HGB 13.9 15.2*  HCT 41.8 44.4  MCV 84.4 82.4  PLT 335 338   Basic Metabolic Panel: Recent Labs  Lab 09/02/19 2341 09/03/19 0403  NA 140 140  K 2.5* 4.6  CL 112* 105  CO2 20* 27  GLUCOSE 167* 157*  BUN 9 9  CREATININE 0.68 0.82  CALCIUM 6.8* 9.0  MG  1.5*  --    GFR: Estimated Creatinine Clearance: 103.7 mL/min (by C-G formula based on SCr of 0.82 mg/dL). Liver Function Tests: Recent Labs  Lab 09/02/19 2341  AST 19  ALT 27  ALKPHOS 62  BILITOT 0.4  PROT 5.6*  ALBUMIN 3.0*   No results for input(s): LIPASE, AMYLASE in the last 168 hours. No results for input(s): AMMONIA in the last 168 hours. Coagulation Profile: Recent Labs  Lab 09/02/19 2341  INR 1.0   Cardiac Enzymes: No results for input(s): CKTOTAL, CKMB, CKMBINDEX, TROPONINI in the last 168 hours. BNP (last 3 results) No results for input(s): PROBNP in the last 8760 hours. HbA1C: No results for input(s): HGBA1C in the last 72 hours. CBG: No results for input(s): GLUCAP in the last 168 hours. Lipid Profile: No results for input(s): CHOL, HDL, LDLCALC, TRIG, CHOLHDL, LDLDIRECT in the last 72 hours. Thyroid Function Tests: Recent Labs    09/02/19 2341  TSH 0.985  FREET4 0.83   Anemia Panel: No results for input(s): VITAMINB12, FOLATE, FERRITIN, TIBC, IRON, RETICCTPCT in the last 72 hours. Sepsis Labs: No results for input(s): PROCALCITON, LATICACIDVEN in the last 168 hours.  Recent Results (from the past 240 hour(s))  SARS Coronavirus 2 by RT PCR (hospital order, performed in [facility] hospital lab) Nasopharyngeal Nasopharyngeal Swab     Status: None   Collection Time: 09/02/19  1:06 AM   Specimen: Nasopharyngeal Swab  Result Value Ref Range Status   SARS Coronavirus 2 NEGATIVE NEGATIVE Final    Comment: (NOTE) SARS-CoV-2 target nucleic acids are NOT DETECTED.  The SARS-CoV-2 RNA is generally detectable in upper and lower respiratory specimens during the acute phase of infection. The lowest concentration of SARS-CoV-2 viral copies this assay can detect is 250 copies / mL. A negative result does not preclude SARS-CoV-2 infection and should not be used as the sole basis for treatment or other patient management decisions.  A negative result may occur  with improper specimen collection / handling, submission of specimen other than nasopharyngeal swab, presence of viral mutation(s) within the areas targeted by this assay, and inadequate number of viral copies (<250 copies / mL). A negative result must be combined with clinical observations, patient history, and epidemiological information.  Fact Sheet for Patients:   StrictlyIdeas.no  Fact Sheet for Healthcare Providers: BankingDealers.co.za  This test is not yet approved or  cleared by the [place] FDA and has been authorized for detection and/or diagnosis of SARS-CoV-2 by FDA under an Emergency Use Authorization (EUA).  This EUA will remain in effect (meaning this test can be used) for the duration of the COVID-19 declaration under Section 564(b)(1) of the Act, 21 U.S.C. section 360bbb-3(b)(1), unless the authorization is terminated or revoked sooner.  Performed at [facility], [address] [place],  [address]   MRSA PCR Screening     Status: None   Collection Time:  09/03/19  2:39 AM   Specimen: Nasopharyngeal  Result Value Ref Range Status   MRSA by PCR NEGATIVE NEGATIVE Final    Comment:        The GeneXpert MRSA Assay (FDA approved for NASAL specimens only), is one component of a comprehensive MRSA colonization surveillance program. It is not intended to diagnose MRSA infection nor to guide or monitor treatment for MRSA infections. Performed at [facility], [address], [place], [place] [address]          Radiology Studies: DG Chest Portable 1 View  Result Date: 09/03/2019 CLINICAL DATA:  Chest pain EXAM: PORTABLE CHEST 1 VIEW COMPARISON:  September 17, 2017 FINDINGS: The heart size and mediastinal contours are within normal limits. There is prominence of the central pulmonary vasculature. The visualized skeletal structures are unremarkable. IMPRESSION: Mild pulmonary vascular  congestion. Electronically Signed   By: Prudencio Pair M.D.   On: 09/03/2019 00:31        Scheduled Meds: . amLODipine  5 mg Oral Daily  . Chlorhexidine Gluconate Cloth  6 each Topical Daily  . heparin  1,200 Units Intravenous Once  . metoprolol succinate  25 mg Oral Daily   Continuous Infusions: . amiodarone (NEXTERONE) IV bolus only 150 mg/100 mL    . heparin 1,100 Units/hr (09/03/19 0700)     LOS: 0 days    Time spent: 26 minutes    Sharen Hones, MD [facility]   To contact the attending provider between 7A-7P or the covering provider during after hours 7P-7A, please log into the web site [email or web address] and access using universal [place] password for that web site. If you do not have the password, please call the hospital operator.  09/03/2019, 2:15 PM

## 2019-09-03 NOTE — ED Provider Notes (Signed)
[facility] Emergency Department Provider Note  ____________________________________________  Time seen: Approximately 12:50 AM  I have reviewed the triage vital signs and the nursing notes.   HISTORY  Chief Complaint Tachycardia   HPI Janisha Bueso is a 54 y.o. female with a history of anemia, SVT, hypertension who presents for evaluation of chest pain. Patient reports that she was driving home when she started experiencing chest pain and palpitations. She felt very dizzy. She points to a police station and they called 911. When EMS arrived patient was found to be in V. tach. She received a dose of 150 mg of IV amiodarone and was started on a drip. Patient converted in route to the emergency room. She was complaining of discomfort in her chest which she attributed to GERD and mild shortness of breath associated with the symptoms. At this time she denies any chest pain or shortness of breath.  Patient reports prior history of SVT which converted with adenosine. Denies any prior history of V. tach.  Past Medical History:  Diagnosis Date  . Anemia due to blood loss, chronic    due to menorrhagia  . Back pain   . Bladder infection   . Chicken pox   . Fibroid uterus   . Hypertension   . SVT (supraventricular tachycardia) [facility])     Patient Active Problem List   Diagnosis Date Noted  . Ventricular tachycardia ([place]) 09/03/2019  . Anemia 07/17/2016  . Anemia due to chronic blood loss 07/16/2016  . Acute pulmonary embolism ([place]) 03/17/2016  . DVT (deep venous thrombosis) ([place]) 03/17/2016  . Fibroid uterus   . Anemia due to blood loss, chronic   . Symptomatic anemia 09/28/2015  . Uterine mass 09/28/2015    Past Surgical History:  Procedure Laterality Date  . ABDOMINAL HYSTERECTOMY    . EMBOLIZATION Bilateral 07/18/2016   Procedure: Embolization, uterine arteries;  Surgeon: Algernon Huxley, MD;  Location: [facility];  Service:  Cardiovascular;  Laterality: Bilateral;  . HYSTEROSCOPY WITH D & C  10/2014   [facility], Dr Guadalupe Maple  . none      Prior to Admission medications   Medication Sig Start Date End Date Taking? Authorizing Provider  amLODipine (NORVASC) 5 MG tablet Take 5 mg by mouth daily. 08/04/19  Yes [provider]  HYDROcodone-acetaminophen (NORCO/VICODIN) 5-325 MG tablet Take 1 tablet by mouth 3 (three) times daily as needed. 08/26/19  Yes [provider]  lisinopril-hydrochlorothiazide (ZESTORETIC) 10-12.5 MG tablet Take 1 tablet by mouth daily. 05/12/19  Yes Vallarie Mare M, PA-C  metoprolol succinate (TOPROL-XL) 25 MG 24 hr tablet Take 1 tablet (25 mg total) by mouth daily. 05/12/19 09/03/19 Yes Vallarie Mare M, PA-C    Allergies Latex  Family History  Problem Relation Age of Onset  . Hypertension Mother   . Diabetes Mother   . Glaucoma Mother   . Heart disease Father        Congestive heart condition  . Clotting disorder Maternal Grandmother     Social History Social History   Tobacco Use  . Smoking status: Never Smoker  . Smokeless tobacco: Never Used  Vaping Use  . Vaping Use: Never used  Substance Use Topics  . Alcohol use: Yes    Comment: occasionally  . Drug use: No    Review of Systems  Constitutional: Negative for fever. + Lightheadedness Eyes: Negative for visual changes. ENT: Negative for sore throat. Neck: No neck pain  Cardiovascular: + chest  pain and palpitation Respiratory: + shortness of breath. Gastrointestinal: Negative for abdominal pain, vomiting or diarrhea. Genitourinary: Negative for dysuria. Musculoskeletal: Negative for back pain. Skin: Negative for rash. Neurological: Negative for headaches, weakness or numbness. Psych: No SI or HI  ____________________________________________   PHYSICAL EXAM:  VITAL SIGNS: ED Triage Vitals  Enc Vitals Group     BP 09/02/19 2335 130/86     Pulse Rate 09/02/19 2335 (!) 103     Resp 09/02/19 2335 20      Temp 09/02/19 2335 98.1 F (36.7 C)     Temp src --      SpO2 09/02/19 2335 97 %     Weight 09/02/19 2332 250 lb (113.4 kg)     Height 09/02/19 2332 5\' 6"  (1.676 m)     Head Circumference --      Peak Flow --      Pain Score 09/02/19 2332 0     Pain Loc --      Pain Edu? --      Excl. in [place]? --     Constitutional: Alert and oriented. Well appearing and in no apparent distress. HEENT:      Head: Normocephalic and atraumatic.         Eyes: Conjunctivae are normal. Sclera is non-icteric.       Mouth/Throat: Mucous membranes are moist.       Neck: Supple with no signs of meningismus. Cardiovascular: Regular rate and rhythm. No murmurs, gallops, or rubs.  Respiratory: Normal respiratory effort. Lungs are clear to auscultation bilaterally. Gastrointestinal: Soft, non tender Musculoskeletal: No edema, cyanosis, or erythema of extremities. Neurologic: Normal speech and language. Face is symmetric. Moving all extremities. No gross focal neurologic deficits are appreciated. Skin: Skin is warm, dry and intact. No rash noted. Psychiatric: Mood and affect are normal. Speech and behavior are normal.  ____________________________________________   LABS (all labs ordered are listed, but only abnormal results are displayed)  Labs Reviewed  COMPREHENSIVE METABOLIC PANEL - Abnormal; Notable for the following components:      Result Value   Potassium 2.5 (*)    Chloride 112 (*)    CO2 20 (*)    Glucose, Bld 167 (*)    Calcium 6.8 (*)    Total Protein 5.6 (*)    Albumin 3.0 (*)    All other components within normal limits  MAGNESIUM - Abnormal; Notable for the following components:   Magnesium 1.5 (*)    All other components within normal limits  SARS CORONAVIRUS 2 BY RT PCR (HOSPITAL ORDER, [place] [facility])  MRSA PCR SCREENING  CBC WITH DIFFERENTIAL/PLATELET  PROTIME-INR  TSH  T4, FREE  BASIC METABOLIC PANEL  CBC  HIV ANTIBODY (ROUTINE TESTING W REFLEX)    TROPONIN I (HIGH SENSITIVITY)  TROPONIN I (HIGH SENSITIVITY)   ____________________________________________  EKG  ED ECG REPORT I, Rudene Re, the attending physician, personally viewed and interpreted this ECG.  Sinus tachycardia, rate of 100, right axis deviation, no ST elevations or depressions.  00:12AM -SVT, rate of 144, prolonged QTC, no ST elevations or depressions.  00:18 AM -sinus tachycardia, rate of 109, normal intervals, normal axis, no ST elevations or depressions. ____________________________________________  RADIOLOGY  I have personally reviewed the images performed during this visit and I agree with the Radiologist's read.   Interpretation by Radiologist:  DG Chest Portable 1 View  Result Date: 09/03/2019 CLINICAL DATA:  Chest pain EXAM: PORTABLE CHEST 1 VIEW COMPARISON:  September 17, 2017 FINDINGS: The heart size and mediastinal contours are within normal limits. There is prominence of the central pulmonary vasculature. The visualized skeletal structures are unremarkable. IMPRESSION: Mild pulmonary vascular congestion. Electronically Signed   By: Prudencio Pair M.D.   On: 09/03/2019 00:31     ____________________________________________   PROCEDURES  Procedure(s) performed:yes .1-3 Lead EKG Interpretation Performed by: Rudene Re, MD Authorized by: Rudene Re, MD     Interpretation: abnormal     ECG rate assessment: tachycardic     Rhythm: ventricular tachycardia   .Cardioversion  Date/Time: 09/03/2019 12:54 AM Performed by: Rudene Re, MD Authorized by: Rudene Re, MD   Consent:    Consent obtained:  Verbal   Consent given by:  Patient   Risks discussed:  Induced arrhythmia   Alternatives discussed:  Alternative treatment Pre-procedure details:    Cardioversion basis:  Emergent   Rhythm:  Supraventricular tachycardia   Electrode placement:  Anterior-posterior Attempt one:    Cardioversion mode attempt one:  Adenosine.   Shock outcome:  Conversion to normal sinus rhythm Post-procedure details:    Patient status:  Awake   Patient tolerance of procedure:  Tolerated well, no immediate complications   Critical Care performed: yes  CRITICAL CARE Performed by: Rudene Re  ?  Total critical care time: 60 min  Critical care time was exclusive of separately billable procedures and treating other patients.  Critical care was necessary to treat or prevent imminent or life-threatening deterioration.  Critical care was time spent personally by me on the following activities: development of treatment plan with patient and/or surrogate as well as nursing, discussions with consultants, evaluation of patient's response to treatment, examination of patient, obtaining history from patient or surrogate, ordering and performing treatments and interventions, ordering and review of laboratory studies, ordering and review of radiographic studies, pulse oximetry and re-evaluation of patient's condition.  ____________________________________________   INITIAL IMPRESSION / ASSESSMENT AND PLAN / ED COURSE   54 y.o. female with a history of anemia, SVT, hypertension who presents for evaluation of chest pain. Patient found to be in V. tach per EMS. Received a bolus of 150 mg of IV amiodarone and started on a drip. When patient arrived to the emergency room she was back on normal sinus rhythm. Her EKG shows no bundle branch block or ischemia. Patient then went back into V. tach. She received several boluses of amiodarone and placed on a drip but continues to go in and out of V. tach. She was then switched to procainamide and received 200 mg boluses and started on a drip with no significant changes. Her mentation was normal blood pressure normal throughout the entire time. She was given IV magnesium as well. At this point I discussed with Dr. Rockey Situ who recommended continuing amiodarone bolus and increasing to 300 mg.  After receiving a bolus of 300 mg of amiodarone patient went into SVT. She was then given 6 mg of adenosine and converted back to normal sinus rhythm. She continued to have intermittent short runs of V. tach and was placed on amiodarone continuous infusion at 6 mg/min. She has several electrolyte abnormalities including hypomagnesemia, hypokalemia, hypocalcemia. All of these electrolytes were supplemented. Patient is currently stable on NSR on amio drip. Will discuss with hospitalist for admission. Old medical records reviewed. Patient is on telemetry for close monitoring. Case discussed with Dr. Rockey Situ and Dr. Sidney Ace  _________________________ 2:14 AM on 09/03/2019 -----------------------------------------  Since supplementation of electrolytes started patient has remained in normal sinus  rhythm with no further episodes of V. tach.  Thyroid studies showing no evidence of hyperthyroidism.    _____________________________________________ Please note:  Patient was evaluated in Emergency Department today for the symptoms described in the history of present illness. Patient was evaluated in the context of the global COVID-19 pandemic, which necessitated consideration that the patient might be at risk for infection with the SARS-CoV-2 virus that causes COVID-19. Institutional protocols and algorithms that pertain to the evaluation of patients at risk for COVID-19 are in a state of rapid change based on information released by regulatory bodies including the CDC and federal and state organizations. These policies and algorithms were followed during the patient's care in the ED.  Some ED evaluations and interventions may be delayed as a result of limited staffing during the pandemic.   [place] Controlled Substance Database was reviewed by me. ____________________________________________   FINAL CLINICAL IMPRESSION(S) / ED DIAGNOSES   Final diagnoses:  Ventricular tachycardia ([place])  Hypokalemia    Hypomagnesemia  Hypocalcemia      NEW MEDICATIONS STARTED DURING THIS VISIT:  ED Discharge Orders    None       Note:  This document was prepared using Dragon voice recognition software and may include unintentional dictation errors.    Alfred Levins, [place], MD 09/03/19 [phone]

## 2019-09-03 NOTE — ED Notes (Signed)
Procainamide bolus going. 100mg  at 929mL/hr. As per ER provider

## 2019-09-03 NOTE — ED Notes (Signed)
Pts potassium going over 2 hours and not 1 after talking with ER provider. Pt stated it was burning and causing pain.

## 2019-09-03 NOTE — ED Notes (Signed)
Er provider is on the phone with cardiology.

## 2019-09-03 NOTE — ED Notes (Signed)
Pt is not tolerating IV potassium. Admit provider knows that it has been stopped for transport. Secure chat opened between ICU RN, provider and this RN trying to decide another course of action.

## 2019-09-03 NOTE — ED Notes (Signed)
Pt went in and out of v-tach, less than 30 seconds. ER provider at bedside.

## 2019-09-03 NOTE — ED Notes (Signed)
Pt is in v-tach. ER provider is at bedside and charge RN is at bedside.

## 2019-09-03 NOTE — ED Notes (Signed)
Magnesium went in as a rapid bolus, wide open and finished faster than 1 hour as per ER provider

## 2019-09-03 NOTE — Consult Note (Signed)
CARDIOLOGY CONSULT NOTE  Patient ID: Kaitlin Brown MRN: [id number] DOB/AGE: 52-Nov-1967 52 y.o.  Admit date: 09/02/2019 Primary Physician   None Primary Cardiologist None Chief Complaint  Tachycaria Requesting  Dr. Roosevelt Locks  HPI:   She has a past history of SVT.  She is seen at [facility].  I was able to review [facility]ilogy records although I do not see actual recommendations or previous treatments of this SVT I see mention of her having this treated with Valsalva.  She mentions previous treatment with adenosine as well.  Sounds like she has had now couple of episodes in the last year and several episodes overall.  There was a mention of possibly doing an ablation in the future.  She reports a treadmill test years ago.  She has been managed for her blood pressure and has seen a cardiologist for this.  Sounds like she has been on amlodipine and has felt dizzy with this.  The other medications including her lisinopril HCTZ and beta-blocker does seem to have been stable.  She went to her friend's house and ate some pizza.  She was driving home when she felt her usual rapid heart rate that is her SVT.  She pulled over into the police station.  She was transported to the emergency room.  She had tachycardia.  This was thought to be VT.  However, the only EKG I see is narrow complex.  She was treated with amiodarone and converted to NSR.  She had recurrent tachycardia and then was treated with procainamide and converted to NSR.  However, he had recurrent tachycardia and was treated with adenosine.    Converted after adenosine and seems to be maintaining NSR.  Of note she was found to be very hypokalemic.  She said that when she had this arrhythmia she did feel some pain in her back.  This is her usual along with some substernal discomfort and some mild left arm discomfort.  She says that when she is not in this rhythm she feels great.  Has been exercising routinely and denies any recent cardiovascular  symptoms such as chest discomfort, neck or arm discomfort.  She has not had any shortness of breath, PND or orthopnea.  Past Medical History:  Diagnosis Date  . Anemia due to blood loss, chronic    due to menorrhagia  . Back pain   . Bladder infection   . Chicken pox   . Fibroid uterus   . Hypertension   . SVT (supraventricular tachycardia) (HCC)     Past Surgical History:  Procedure Laterality Date  . ABDOMINAL HYSTERECTOMY    . EMBOLIZATION Bilateral 07/18/2016   Procedure: Embolization, uterine arteries;  Surgeon: Algernon Huxley, MD;  Location: [facility];  Service: Cardiovascular;  Laterality: Bilateral;  . HYSTEROSCOPY WITH D & C  10/2014   [facility], Dr Guadalupe Maple  . none      Allergies  Allergen Reactions  . Latex Itching   Medications Prior to Admission  Medication Sig Dispense Refill Last Dose  . amLODipine (NORVASC) 5 MG tablet Take 5 mg by mouth daily.   09/03/2019 at 1100  . HYDROcodone-acetaminophen (NORCO/VICODIN) 5-325 MG tablet Take 1 tablet by mouth 3 (three) times daily as needed.   prn at prn  . lisinopril-hydrochlorothiazide (ZESTORETIC) 10-12.5 MG tablet Take 1 tablet by mouth daily. 30 tablet 3 09/03/2019 at 1100  . metoprolol succinate (TOPROL-XL) 25 MG 24 hr tablet Take 1 tablet (25 mg total) by mouth daily. [place]  tablet 3 09/03/2019 at 1100   Family History  Problem Relation Age of Onset  . Hypertension Mother   . Diabetes Mother   . Glaucoma Mother   . Heart disease Father        Congestive heart condition  . Clotting disorder Maternal Grandmother     Social History   Socioeconomic History  . Marital status: Single    Spouse name: Not on file  . Number of children: Not on file  . Years of education: Not on file  . Highest education level: Not on file  Occupational History  . Not on file  Tobacco Use  . Smoking status: Never Smoker  . Smokeless tobacco: Never Used  Vaping Use  . Vaping Use: Never used  Substance and Sexual Activity  . Alcohol  use: Yes    Comment: occasionally  . Drug use: No  . Sexual activity: Yes    Birth control/protection: None  Other Topics Concern  . Not on file  Social History Narrative  . Not on file   Social Determinants of Health   Financial Resource Strain:   . Difficulty of Paying Living Expenses:   Food Insecurity:   . Worried About Charity fundraiser in the Last Year:   . Arboriculturist in the Last Year:   Transportation Needs:   . Film/video editor (Medical):   Marland Kitchen Lack of Transportation (Non-Medical):   Physical Activity:   . Days of Exercise per Week:   . Minutes of Exercise per Session:   Stress:   . Feeling of Stress :   Social Connections:   . Frequency of Communication with Friends and Family:   . Frequency of Social Gatherings with Friends and Family:   . Attends Religious Services:   . Active Member of Clubs or Organizations:   . Attends Archivist Meetings:   Marland Kitchen Marital Status:   Intimate Partner Violence:   . Fear of Current or Ex-Partner:   . Emotionally Abused:   Marland Kitchen Physically Abused:   . Sexually Abused:      ROS:    As stated in the HPI and negative for all other systems.  Physical Exam: Blood pressure 119/79, pulse 71, temperature 98.2 F (36.8 C), temperature source Oral, resp. rate 15, height 5\' 6"  (1.676 m), weight 113.3 kg, last menstrual period 03/23/2017, SpO2 93 %.  GENERAL:  Well appearing HEENT:  Pupils equal round and reactive, fundi not visualized, oral mucosa unremarkable NECK:  No jugular venous distention, waveform within normal limits, carotid upstroke brisk and symmetric, no bruits, no thyromegaly LYMPHATICS:  No cervical, inguinal adenopathy LUNGS:  Clear to auscultation bilaterally BACK:  No CVA tenderness CHEST:  Unremarkable HEART:  PMI not displaced or sustained,S1 and S2 within normal limits, no S3, no S4, no clicks, no rubs, no murmurs ABD:  Flat, positive bowel sounds normal in frequency in pitch, no bruits, no rebound,  no guarding, no midline pulsatile mass, no hepatomegaly, no splenomegaly EXT:  2 plus pulses throughout, no edema, no cyanosis no clubbing SKIN:  No rashes no nodules NEURO:  Cranial nerves II through XII grossly intact, motor grossly intact throughout PSYCH:  Cognitively intact, oriented to person place and time   Labs: Lab Results  Component Value Date   BUN 9 09/03/2019   Lab Results  Component Value Date   CREATININE 0.82 09/03/2019   Lab Results  Component Value Date   NA 140 09/03/2019   K 4.6  09/03/2019   CL 105 09/03/2019   CO2 27 09/03/2019   Lab Results  Component Value Date   TROPONINI <0.03 09/19/2017   Lab Results  Component Value Date   WBC 6.9 09/03/2019   HGB 15.2 (H) 09/03/2019   HCT 44.4 09/03/2019   MCV 82.4 09/03/2019   PLT 351 09/03/2019   No results found for: CHOL, HDL, LDLCALC, LDLDIRECT, TRIG, CHOLHDL Lab Results  Component Value Date   ALT 27 09/02/2019   AST 19 09/02/2019   ALKPHOS 62 09/02/2019   BILITOT 0.4 09/02/2019    ECHO:  Pending.     Radiology:   CXR:    Mild pulmonary vascular congestion.    EKG:   NSR, rate 144, narrow complex SVT.  Rightward axis.  No acute ST T wave changes.    ASSESSMENT AND PLAN:   TACHYCARDIA:  SVT.  The patient had recurrent narrow complex tachycardia.  Unlikely increase her beta-blocker at discharge.  She will get an echocardiogram.  We can set her up to see electrophysiology as an outpatient for possible ablation.  CHEST PAIN:    Her troponin is slightly elevated but I suspect this is related to rapid heart rate.  She otherwise can be physically active without symptoms.  Not suggesting any other ischemia evaluation will be necessary.  Echo is pending.  HTN: I will stop her amlodipine at discharge and most likely continue lisinopril perhaps at a higher dose as well as increase beta-blocker.  HYPOKALEMIA:  Likely contributing to refractory tachycardia.  Now supplemented per primary team.    I  suspect this may have been related to her HCTZ also she is chronically been on this dose.  That will be stopped.   SignedMinus Breeding 09/03/2019, 2:51 PM

## 2019-09-04 ENCOUNTER — Inpatient Hospital Stay (HOSPITAL_COMMUNITY)
Admit: 2019-09-04 | Discharge: 2019-09-04 | Disposition: A | Discharge status: Home / Self Care | Payer: BLUE CROSS/BLUE SHIELD | Attending: Internal Medicine | Admitting: Internal Medicine

## 2019-09-04 DIAGNOSIS — R9431 Abnormal electrocardiogram [ECG] [EKG]: Secondary | ICD-10-CM

## 2019-09-04 DIAGNOSIS — R778 Other specified abnormalities of plasma proteins: Secondary | ICD-10-CM

## 2019-09-04 DIAGNOSIS — I272 Pulmonary hypertension, unspecified: Secondary | ICD-10-CM

## 2019-09-04 LAB — BASIC METABOLIC PANEL
Anion gap: 5 (ref 5–15)
BUN: 6 mg/dL (ref 6–20)
CO2: 27 mmol/L (ref 22–32)
Calcium: 8.9 mg/dL (ref 8.9–10.3)
Chloride: 107 mmol/L (ref 98–111)
Creatinine, Ser: 0.54 mg/dL (ref 0.44–1.00)
GFR calc Af Amer: >60 mL/min (ref >60)
GFR calc non Af Amer: >60 mL/min (ref >60)
Glucose, Bld: 103 mg/dL — ABNORMAL HIGH (ref 70–99)
Potassium: 4.1 mmol/L (ref 3.5–5.1)
Sodium: 139 mmol/L (ref 135–145)

## 2019-09-04 LAB — GLUCOSE, CAPILLARY: Glucose-Capillary: 229 mg/dL — ABNORMAL HIGH (ref 70–99)

## 2019-09-04 LAB — CBC
HCT: 43.2 % (ref 36.0–46.0)
Hemoglobin: 14.3 g/dL (ref 12.0–15.0)
MCH: 27.6 pg (ref 26.0–34.0)
MCHC: 33.1 g/dL (ref 30.0–36.0)
MCV: 83.2 fL (ref 80.0–100.0)
Platelets: 319 10*3/uL (ref 150–400)
RBC: 5.19 MIL/uL — ABNORMAL HIGH (ref 3.87–5.11)
RDW: 13.6 % (ref 11.5–15.5)
WBC: 5.4 10*3/uL (ref 4.0–10.5)
nRBC: 0 % (ref 0.0–0.2)

## 2019-09-04 LAB — ECHOCARDIOGRAM COMPLETE
Height: 66 in
Weight: 3996.5 oz

## 2019-09-04 MED ORDER — METOPROLOL SUCCINATE ER 50 MG PO TB24: 50.0000 mg | ORAL_TABLET | Freq: Every day | ORAL | 0 refills | Status: DC

## 2019-09-04 MED ORDER — METOPROLOL SUCCINATE ER 50 MG PO TB24: 50.0000 mg | ORAL_TABLET | Freq: Every day | ORAL | Status: DC

## 2019-09-04 MED ORDER — LISINOPRIL 10 MG PO TABS
10.0000 mg | ORAL_TABLET | Freq: Every day | ORAL | 0 refills | Status: DC
Start: 2019-09-04 — End: 2019-10-03

## 2019-09-04 NOTE — Discharge Instructions (Signed)
Follow-up with the cardiology to schedule EP study.

## 2019-09-04 NOTE — Plan of Care (Signed)

## 2019-09-04 NOTE — Discharge Summary (Signed)
Physician Discharge Summary  Patient ID: Kaitlin Brown MRN: [id number] DOB/AGE: [birth year] 52 y.o.  Admit date: 09/02/2019 Discharge date: 09/04/2019  Admission Diagnoses:  Discharge Diagnoses:  Active Problems:   Ventricular tachycardia [facility])   Discharged Condition: good  Hospital Course:  RosalindRichmondis a89 y.o.African-American femalewith a known history of hypertension and SVT, who presented to the emergency room with the onset ofsharp left-sided chest pain with associated palpitations without dyspnea or nausea or vomiting or cough or wheezing.  She had  3 prior episodes. Upon arrival in the emergency room, chest x-ray showed mild pulmonary vascular congestion, EKG showed supraventricular tachycardia of 144. Troponin 169. Patient was started on amiodarone drip.  She has converted to sinus rhythm.  Patient is seen by cardiology, echocardiogram showed normal ejection fraction.  Mild pulmonary hypertension.  No valvular disease.  Some diastolic dysfunction.  Since admission to the hospital, patient did not have recurrence of arrhythmia.  He is medically stable to be discharged.   #1.  PSVT. I have reviewed all the strips scanned in the computer and EKG in the medical chart.  There is an episode of supraventricular tachycardia.  Did not see an episode with ventricular tachycardia.  Patient states that she had 3 prior episodes.  In 1 one episode, she was told by her cardiologist that " the heart beats initiated from the top of the heart, had a slow conduction".  Question if she had WPW.  Current episode was triggered by hypokalemia.  Potassium level has normalized.  She did not have recurrence of arrhythmia.    Echocardiogram performed, results are listed below.  Patient is medically stable to be discharged.  #2.  Hypokalemia. Caused by recent diarrhea and hydrochlorothiazide.    I will discontinue HCTZe to prevent recurrence of hypokalemia.  #3.   Hypomagnesemia. Normalized  #4 essential hypertension. Continue home medicine except hydrochlorothiazide.  #5.  Elevated troponin. Continue heparin drip and beta-blocker.    Patient is seen by cardiology, believed to be caused by tachycardia.   Consults: cardiology  Significant Diagnostic Studies:  Echo: 1. Left ventricular ejection fraction, by estimation, is 60 to 65%. The  left ventricle has normal function. The left ventricle has no regional  wall motion abnormalities. There is mild left ventricular hypertrophy.  Left ventricular diastolic parameters  are consistent with Grade II diastolic dysfunction (pseudonormalization).  2. Right ventricular systolic function is normal. The right ventricular  size is normal. There is mildly elevated pulmonary artery systolic  pressure. The estimated right ventricular systolic pressure is 88.4 mmHg.   Treatments: Amiodarone drip  Discharge Exam: Blood pressure 118/71, pulse 73, temperature 98.5 F (36.9 C), resp. rate 11, height 5\' 6"  (1.676 m), weight 113.3 kg, last menstrual period 03/23/2017, SpO2 97 %. General appearance: alert and cooperative Resp: clear to auscultation bilaterally Cardio: regular rate and rhythm, S1, S2 normal, no murmur, click, rub or gallop GI: soft, non-tender; bowel sounds normal; no masses,  no organomegaly Extremities: extremities normal, atraumatic, no cyanosis or edema  Disposition: Discharge disposition: 01-Home or Self Care       Discharge Instructions    Diet - low sodium heart healthy   Complete by: As directed    Increase activity slowly   Complete by: As directed      Allergies as of 09/04/2019      Reactions   Latex Itching      Medication List    STOP taking these medications   amLODipine 5 MG tablet Commonly known as:  NORVASC   lisinopril-hydrochlorothiazide 10-12.5 MG tablet Commonly known as: ZESTORETIC     TAKE these medications   HYDROcodone-acetaminophen 5-325 MG  tablet Commonly known as: NORCO/VICODIN Take 1 tablet by mouth 3 (three) times daily as needed.   lisinopril 10 MG tablet Commonly known as: ZESTRIL Take 1 tablet (10 mg total) by mouth daily.   metoprolol succinate 50 MG 24 hr tablet Commonly known as: TOPROL-XL Take 1 tablet (50 mg total) by mouth daily. Take with or immediately following a meal. Start taking on: September 05, 2019 What changed:   medication strength  how much to take  additional instructions       Follow-up Information    Minus Breeding, MD Follow up in 1 week(s).   Specialty: Cardiology Contact information: [address] [place] [address] [phone]               Signed: Sharen Hones 09/04/2019, 2:17 PM

## 2019-09-04 NOTE — Progress Notes (Signed)
Progress Note  Patient Name: Kaitlin Brown Date of Encounter: 09/04/2019  Primary Cardiologist:   No primary care provider on file.   Subjective   No arrhythmias or pain last night.  No presyncope or dizziness.  She has had a headache  Inpatient Medications    Scheduled Meds: . amLODipine  5 mg Oral Daily  . Chlorhexidine Gluconate Cloth  6 each Topical Daily  . metoprolol succinate  25 mg Oral Daily   Continuous Infusions: . amiodarone (NEXTERONE) IV bolus only 150 mg/100 mL     PRN Meds: acetaminophen **OR** acetaminophen, adenosine, amiodarone (NEXTERONE) IV bolus only 150 mg/100 mL, HYDROcodone-acetaminophen, magnesium hydroxide, ondansetron **OR** ondansetron (ZOFRAN) IV   Vital Signs    Vitals:   09/03/19 2030 09/04/19 0000 09/04/19 0400 09/04/19 0415  BP: 126/90 118/85 115/72 115/72  Pulse: 68 70 74 74  Resp:      Temp: 97.8 F (36.6 C) 97.7 F (36.5 C) 98.3 F (36.8 C) 98.3 F (36.8 C)  TempSrc: Oral  Oral Oral  SpO2: 100% 98% 97% 97%  Weight:      Height:        Intake/Output Summary (Last 24 hours) at 09/04/2019 0952 Last data filed at 09/03/2019 1440 Gross per 24 hour  Intake 226.66 ml  Output 500 ml  Net -273.34 ml   Filed Weights   09/02/19 2332 09/03/19 0250  Weight: 113.4 kg 113.3 kg    Telemetry    NSR - Personally Reviewed  ECG    NA - Personally Reviewed  Physical Exam   GEN: No acute distress.   Neck: No  JVD Cardiac: RRR, no murmurs, rubs, or gallops.  Respiratory: Clear  to auscultation bilaterally. GI: Soft, nontender, non-distended  MS: No  edema; No deformity. Neuro:  Nonfocal  Psych: Normal affect   Labs    Chemistry Recent Labs  Lab 09/02/19 2341 09/03/19 0403 09/04/19 0441  NA 140 140 139  K 2.5* 4.6 4.1  CL 112* 105 107  CO2 20* 27 27  GLUCOSE 167* 157* 103*  BUN 9 9 6   CREATININE 0.68 0.82 0.54  CALCIUM 6.8* 9.0 8.9  PROT 5.6*  --   --   ALBUMIN 3.0*  --   --   AST 19  --   --   ALT  27  --   --   ALKPHOS 62  --   --   BILITOT 0.4  --   --   GFRNONAA >60 >60 >60  GFRAA >60 >60 >60  ANIONGAP 8 8 5      Hematology Recent Labs  Lab 09/02/19 2341 09/03/19 0403 09/04/19 0441  WBC 6.4 6.9 5.4  RBC 4.95 5.39* 5.19*  HGB 13.9 15.2* 14.3  HCT 41.8 44.4 43.2  MCV 84.4 82.4 83.2  MCH 28.1 28.2 27.6  MCHC 33.3 34.2 33.1  RDW 13.5 13.2 13.6  PLT 335 351 319    Cardiac EnzymesNo results for input(s): TROPONINI in the last 168 hours. No results for input(s): TROPIPOC in the last 168 hours.    BNPNo results for input(s): BNP, PROBNP in the last 168 hours.   DDimer No results for input(s): DDIMER in the last 168 hours.   Radiology    DG Chest Portable 1 View  Result Date: 09/03/2019 CLINICAL DATA:  Chest pain EXAM: PORTABLE CHEST 1 VIEW COMPARISON:  September 17, 2017 FINDINGS: The heart size and mediastinal contours are within normal limits. There is prominence of the central pulmonary vasculature.  The visualized skeletal structures are unremarkable. IMPRESSION: Mild pulmonary vascular congestion. Electronically Signed   By: Prudencio Pair M.D.   On: 09/03/2019 00:31    Cardiac Studies   ECHO:  Pending.    Patient Profile     54 y.o. female with SVT and chest pain.    Assessment & Plan    SVT:    I will send her home on a slightly increased dose of beta blocker.   Stop the Norvasc.   She and I reviewed vagal maneuvers    CHEST PAIN:   Needs echo to be completed prior to discharge.  If NL I am not planning further ischemia work up as she has been very active without any symptoms and elevated trop is likely secondary to SVT.   We will arrange an EP appt in our office.     For questions or updates, please contact [place] Please consult [email or web address] for contact info under Cardiology/STEMI.   Signed, Minus Breeding, MD  09/04/2019, 9:52 AM

## 2019-09-10 ENCOUNTER — Emergency Department: Payer: BLUE CROSS/BLUE SHIELD

## 2019-09-10 ENCOUNTER — Emergency Department
Admission: EM | Admit: 2019-09-10 | Discharge: 2019-09-10 | Disposition: A | Discharge status: Home / Self Care | Payer: BLUE CROSS/BLUE SHIELD | Attending: Emergency Medicine | Admitting: Emergency Medicine

## 2019-09-10 ENCOUNTER — Other Ambulatory Visit: Payer: Self-pay

## 2019-09-10 ENCOUNTER — Encounter: Payer: Self-pay | Admitting: Emergency Medicine

## 2019-09-10 DIAGNOSIS — M79604 Pain in right leg: Secondary | ICD-10-CM | POA: Insufficient documentation

## 2019-09-10 DIAGNOSIS — M79605 Pain in left leg: Secondary | ICD-10-CM | POA: Insufficient documentation

## 2019-09-10 DIAGNOSIS — Z79899 Other long term (current) drug therapy: Secondary | ICD-10-CM | POA: Insufficient documentation

## 2019-09-10 DIAGNOSIS — I10 Essential (primary) hypertension: Secondary | ICD-10-CM | POA: Diagnosis not present

## 2019-09-10 DIAGNOSIS — M79606 Pain in leg, unspecified: Secondary | ICD-10-CM | POA: Diagnosis present

## 2019-09-10 LAB — COMPREHENSIVE METABOLIC PANEL
ALT: 33 U/L (ref 0–44)
AST: 24 U/L (ref 15–41)
Albumin: 4 g/dL (ref 3.5–5.0)
Alkaline Phosphatase: 84 U/L (ref 38–126)
Anion gap: 6 (ref 5–15)
BUN: 8 mg/dL (ref 6–20)
CO2: 27 mmol/L (ref 22–32)
Calcium: 8.8 mg/dL — ABNORMAL LOW (ref 8.9–10.3)
Chloride: 106 mmol/L (ref 98–111)
Creatinine, Ser: 0.71 mg/dL (ref 0.44–1.00)
GFR calc Af Amer: >60 mL/min (ref >60)
GFR calc non Af Amer: >60 mL/min (ref >60)
Glucose, Bld: 143 mg/dL — ABNORMAL HIGH (ref 70–99)
Potassium: 3.8 mmol/L (ref 3.5–5.1)
Sodium: 139 mmol/L (ref 135–145)
Total Bilirubin: 0.6 mg/dL (ref 0.3–1.2)
Total Protein: 7.4 g/dL (ref 6.5–8.1)

## 2019-09-10 LAB — CBC
HCT: 46 % (ref 36.0–46.0)
Hemoglobin: 15.2 g/dL — ABNORMAL HIGH (ref 12.0–15.0)
MCH: 27.4 pg (ref 26.0–34.0)
MCHC: 33 g/dL (ref 30.0–36.0)
MCV: 82.9 fL (ref 80.0–100.0)
Platelets: 316 10*3/uL (ref 150–400)
RBC: 5.55 MIL/uL — ABNORMAL HIGH (ref 3.87–5.11)
RDW: 13.4 % (ref 11.5–15.5)
WBC: 4.8 10*3/uL (ref 4.0–10.5)
nRBC: 0 % (ref 0.0–0.2)

## 2019-09-10 LAB — CK: Total CK: 66 U/L (ref 38–234)

## 2019-09-10 MED ORDER — OXYCODONE-ACETAMINOPHEN 5-325 MG PO TABS: 1.0000 | ORAL_TABLET | Freq: Four times a day (QID) | ORAL | 0 refills | Status: DC | PRN

## 2019-09-10 MED ORDER — METHOCARBAMOL 500 MG PO TABS: 500.0000 mg | ORAL_TABLET | Freq: Four times a day (QID) | ORAL | 0 refills | Status: DC

## 2019-09-10 NOTE — ED Triage Notes (Signed)
Pt here for severe bilateral leg pain.  Started  Yesterday.  Pt denies injury or strenuous exercise but has been walking a lot. When asked if hx of low K, admits she was admitted for same recently but legs did not hurt then.

## 2019-09-10 NOTE — Discharge Instructions (Signed)
Your lab tests and ultrasound today were all okay.  Continue taking your home medications and add on Robaxin and Percocet as needed.  Keep your appointment with cardiology on Monday for further assessment of your heart arrhythmia.

## 2019-09-10 NOTE — ED Provider Notes (Signed)
[facility] Emergency Department Provider Note  ____________________________________________  Time seen: Approximately 1:33 PM  I have reviewed the triage vital signs and the nursing notes.   HISTORY  Chief Complaint Leg Pain    HPI Kaitlin Brown is a 54 y.o. female with a history of ventricular tachycardia, DVT, anemia who comes ED complaining of bilateral leg pain, gradual onset yesterday, waxing waning, better with getting upright and walking, worse lying down.  Hurts in the whole leg from thigh to ankle.  No back pain, no recent trips slips or falls or trauma.  Denies bowel or bladder incontinence or retention, no numbness or paresthesia.  No fever.  No skin rash.      Past Medical History:  Diagnosis Date  . Anemia due to blood loss, chronic    due to menorrhagia  . Back pain   . Bladder infection   . Chicken pox   . Fibroid uterus   . Hypertension   . SVT (supraventricular tachycardia) [facility])      Patient Active Problem List   Diagnosis Date Noted  . Ventricular tachycardia ([place]) 09/03/2019  . Anemia 07/17/2016  . Anemia due to chronic blood loss 07/16/2016  . Acute pulmonary embolism ([place]) 03/17/2016  . DVT (deep venous thrombosis) ([place]) 03/17/2016  . Fibroid uterus   . Anemia due to blood loss, chronic   . Symptomatic anemia 09/28/2015  . Uterine mass 09/28/2015     Past Surgical History:  Procedure Laterality Date  . ABDOMINAL HYSTERECTOMY    . EMBOLIZATION Bilateral 07/18/2016   Procedure: Embolization, uterine arteries;  Surgeon: Algernon Huxley, MD;  Location: [place] [facility];  Service: Cardiovascular;  Laterality: Bilateral;  . HYSTEROSCOPY WITH D & C  10/2014   [facility], Dr Guadalupe Maple  . none       Prior to Admission medications   Medication Sig Start Date End Date Taking? Authorizing Provider  HYDROcodone-acetaminophen (NORCO/VICODIN) 5-325 MG tablet Take 1 tablet by mouth 3 (three) times daily as needed. 08/26/19    [provider]  lisinopril (ZESTRIL) 10 MG tablet Take 1 tablet (10 mg total) by mouth daily. 09/04/19 10/04/19  Sharen Hones, MD  methocarbamol (ROBAXIN) 500 MG tablet Take 1 tablet (500 mg total) by mouth 4 (four) times daily. 09/10/19   Carrie Mew, MD  metoprolol succinate (TOPROL-XL) 50 MG 24 hr tablet Take 1 tablet (50 mg total) by mouth daily. Take with or immediately following a meal. 09/05/19   Sharen Hones, MD  oxyCODONE-acetaminophen (PERCOCET) 5-325 MG tablet Take 1 tablet by mouth every 6 (six) hours as needed for severe pain. 09/10/19 09/09/20  Carrie Mew, MD     Allergies Latex   Family History  Problem Relation Age of Onset  . Hypertension Mother   . Diabetes Mother   . Glaucoma Mother   . Heart disease Father        Congestive heart condition  . Clotting disorder Maternal Grandmother     Social History Social History   Tobacco Use  . Smoking status: Never Smoker  . Smokeless tobacco: Never Used  Vaping Use  . Vaping Use: Never used  Substance Use Topics  . Alcohol use: Yes    Comment: occasionally  . Drug use: No    Review of Systems  Constitutional:   No fever or chills.  ENT:   No sore throat. No rhinorrhea. Cardiovascular:   No chest pain or syncope. Respiratory:   No dyspnea or cough. Gastrointestinal:  Negative for abdominal pain, vomiting and diarrhea.  Musculoskeletal:   Bilateral leg pain as above All other systems reviewed and are negative except as documented above in ROS and HPI.  ____________________________________________   PHYSICAL EXAM:  VITAL SIGNS: ED Triage Vitals  Enc Vitals Group     BP 09/10/19 1014 (!) 164/98     Pulse Rate 09/10/19 1014 90     Resp 09/10/19 1014 20     Temp 09/10/19 1014 98.3 F (36.8 C)     Temp Source 09/10/19 1014 Oral     SpO2 09/10/19 1014 96 %     Weight 09/10/19 1012 249 lb 12.5 oz (113.3 kg)     Height 09/10/19 1012 5\' 6"  (1.676 m)     Head Circumference --      Peak  Flow --      Pain Score 09/10/19 1012 10     Pain Loc --      Pain Edu? --      Excl. in [place]? --     Vital signs reviewed, nursing assessments reviewed.   Constitutional:   Alert and oriented. Non-toxic appearance. Eyes:   Conjunctivae are normal. EOMI. PERRL. ENT      Head:   Normocephalic and atraumatic.      Nose:   Wearing a mask.      Mouth/Throat:   Wearing a mask.      Neck:   No meningismus. Full ROM. Hematological/Lymphatic/Immunilogical:   No cervical lymphadenopathy. Cardiovascular:   RRR. Symmetric bilateral radial and DP pulses.  No murmurs. Cap refill less than 2 seconds. Respiratory:   Normal respiratory effort without tachypnea/retractions. Breath sounds are clear and equal bilaterally. No wheezes/rales/rhonchi. Gastrointestinal:   Soft and nontender. Non distended. There is no CVA tenderness.  No rebound, rigidity, or guarding.  Musculoskeletal:   Normal range of motion in all extremities. No joint effusions.  No lower extremity tenderness.  No edema.  No midline spinal tenderness.  Symmetric calf circumference.  Negative Homans' sign. Neurologic:   Normal speech and language.  Motor grossly intact. No acute focal neurologic deficits are appreciated.  Skin:    Skin is warm, dry and intact. No rash noted.  No inflammatory changes. no petechiae, purpura, or bullae.  ____________________________________________    LABS (pertinent positives/negatives) (all labs ordered are listed, but only abnormal results are displayed) Labs Reviewed  CBC - Abnormal; Notable for the following components:      Result Value   RBC 5.55 (*)    Hemoglobin 15.2 (*)    All other components within normal limits  COMPREHENSIVE METABOLIC PANEL - Abnormal; Notable for the following components:   Glucose, Bld 143 (*)    Calcium 8.8 (*)    All other components within normal limits  CK  URINALYSIS, COMPLETE (UACMP) WITH MICROSCOPIC    ____________________________________________   EKG  Interpreted by me  Date: 09/10/2019  Rate: 83  Rhythm: normal sinus rhythm  QRS Axis: normal  Intervals: normal  ST/T Wave abnormalities: normal  Conduction Disutrbances: none  Narrative Interpretation: unremarkable      ____________________________________________    RADIOLOGY  US Venous Img Lower Bilateral  Result Date: 09/10/2019 CLINICAL DATA:  Bilateral leg pain.  Recent hospital admission. EXAM: BILATERAL LOWER EXTREMITY VENOUS DOPPLER ULTRASOUND TECHNIQUE: Gray-scale sonography with graded compression, as well as color Doppler and duplex ultrasound were performed to evaluate the lower extremity deep venous systems from the level of the common femoral vein and including the common femoral,  femoral, profunda femoral, popliteal and calf veins including the posterior tibial, peroneal and gastrocnemius veins when visible. The superficial great saphenous vein was also interrogated. Spectral Doppler was utilized to evaluate flow at rest and with distal augmentation maneuvers in the common femoral, femoral and popliteal veins. COMPARISON:  None. FINDINGS: RIGHT LOWER EXTREMITY Common Femoral Vein: No evidence of thrombus. Normal compressibility, respiratory phasicity and response to augmentation. Saphenofemoral Junction: No evidence of thrombus. Normal compressibility and flow on color Doppler imaging. Profunda Femoral Vein: No evidence of thrombus. Normal compressibility and flow on color Doppler imaging. Femoral Vein: No evidence of thrombus. Normal compressibility, respiratory phasicity and response to augmentation. Popliteal Vein: No evidence of thrombus. Normal compressibility, respiratory phasicity and response to augmentation. Calf Veins: No evidence of thrombus. Normal compressibility and flow on color Doppler imaging. Superficial Great Saphenous Vein: No evidence of thrombus. Normal compressibility. Venous Reflux:  None. Other  Findings:  None. LEFT LOWER EXTREMITY Common Femoral Vein: No evidence of thrombus. Normal compressibility, respiratory phasicity and response to augmentation. Saphenofemoral Junction: No evidence of thrombus. Normal compressibility and flow on color Doppler imaging. Profunda Femoral Vein: No evidence of thrombus. Normal compressibility and flow on color Doppler imaging. Femoral Vein: No evidence of thrombus. Normal compressibility, respiratory phasicity and response to augmentation. Popliteal Vein: No evidence of thrombus. Normal compressibility, respiratory phasicity and response to augmentation. Calf Veins: No evidence of thrombus. Normal compressibility and flow on color Doppler imaging. Superficial Great Saphenous Vein: No evidence of thrombus. Normal compressibility. Venous Reflux:  None. Other Findings:  None. IMPRESSION: No evidence of deep venous thrombosis in either lower extremity. Electronically Signed   By: Claudie Revering M.D.   On: 09/10/2019 12:14    ____________________________________________   PROCEDURES Procedures  ____________________________________________  DIFFERENTIAL DIAGNOSIS   DVT, musculoskeletal pain, medication side effect, PAD  CLINICAL IMPRESSION / ASSESSMENT AND PLAN / ED COURSE  Medications ordered in the ED: Medications - No data to display  Pertinent labs & imaging results that were available during my care of the patient were reviewed by me and considered in my medical decision making (see chart for details).  Kaitlin Brown was evaluated in Emergency Department on 09/10/2019 for the symptoms described in the history of present illness. She was evaluated in the context of the global COVID-19 pandemic, which necessitated consideration that the patient might be at risk for infection with the SARS-CoV-2 virus that causes COVID-19. Institutional protocols and algorithms that pertain to the evaluation of patients at risk for COVID-19 are in a state of  rapid change based on information released by regulatory bodies including the CDC and federal and state organizations. These policies and algorithms were followed during the patient's care in the ED.   Patient presents with bilateral leg pain, nonfocal, normal vital signs, doubt cellulitis abscess necrotizing fasciitis.  Ultrasounds are negative for DVT.  Labs unremarkable.  Appears to be musculoskeletal, possibly sciatica, possibly medication related due to recent increase in metoprolol and related decrease in cardiac output if she has underlying PAD.  Recommend she continue follow-up with electrophysiology as scheduled in 2 days, follow-up with vascular surgery if symptoms have not resolved by then.      ____________________________________________   FINAL CLINICAL IMPRESSION(S) / ED DIAGNOSES    Final diagnoses:  Bilateral leg pain     ED Discharge Orders         Ordered    oxyCODONE-acetaminophen (PERCOCET) 5-325 MG tablet  Every 6 hours PRN     Discontinue  Reprint     09/10/19 1331    methocarbamol (ROBAXIN) 500 MG tablet  4 times daily     Discontinue  Reprint     09/10/19 1331          Portions of this note were generated with dragon dictation software. Dictation errors may occur despite best attempts at proofreading.   Carrie Mew, MD 09/10/19 [phone]

## 2019-09-11 NOTE — Progress Notes (Deleted)
Cardiology Office Note   Date:  09/11/2019   ID:  Kaitlin Brown, DOB [birth year], MRN [id number]  PCP:  System, Pcp Not In  Cardiologist:   No primary care provider on file. Referring:  ***  No chief complaint on file.     History of Present Illness: Kaitlin Brown is a 54 y.o. female who presents for ***    RosalindRichmondis a38 y.o.African-American femalewith a known history of hypertension and SVT, who presented to the emergency room with the onset ofsharp left-sided chest pain with associated palpitations without dyspnea or nausea or vomiting or cough or wheezing.She had 3 prior episodes. Upon arrival in the emergency room, chest x-ray showed mild pulmonary vascular congestion, EKG showed supraventricular tachycardia of 144.Troponin 169.Patient was started on amiodarone drip. She has converted to sinus rhythm.  Patient is seen by cardiology, echocardiogram showed normal ejection fraction.  Mild pulmonary hypertension.  No valvular disease.  Some diastolic dysfunction.  Since admission to the hospital, patient did not have recurrence of arrhythmia.  He is medically stable to be discharged.   #1. PSVT. I have reviewed all the strips scanned in the computer and EKG in the medical chart.There isan episode of supraventricular tachycardia. Did not see an episode with ventricular tachycardia. Patient states that she had 3 prior episodes.In1 oneepisode, she was told by her cardiologist that "the heart beatsinitiated from the top of the heart, had a slow conduction".Question if she had WPW. Current episode was triggered by hypokalemia. Potassium level has normalized. She did not have recurrence of arrhythmia.   Echocardiogram performed, results are listed below.  Patient is medically stable to be discharged.  #2. Hypokalemia. Caused by recent diarrhea and hydrochlorothiazide.   I will discontinue HCTZe to prevent recurrence of  hypokalemia.  #3. Hypomagnesemia. Normalized  #4 essential hypertension. Continue home medicine except hydrochlorothiazide.    Past Medical History:  Diagnosis Date  . Anemia due to blood loss, chronic    due to menorrhagia  . Back pain   . Bladder infection   . Chicken pox   . Fibroid uterus   . Hypertension   . SVT (supraventricular tachycardia) ([facility])     Past Surgical History:  Procedure Laterality Date  . ABDOMINAL HYSTERECTOMY    . EMBOLIZATION Bilateral 07/18/2016   Procedure: Embolization, uterine arteries;  Surgeon: Algernon Huxley, MD;  Location: [place] [facility];  Service: Cardiovascular;  Laterality: Bilateral;  . HYSTEROSCOPY WITH D & C  10/2014   [facility], Dr Guadalupe Maple  . none       Current Outpatient Medications  Medication Sig Dispense Refill  . HYDROcodone-acetaminophen (NORCO/VICODIN) 5-325 MG tablet Take 1 tablet by mouth 3 (three) times daily as needed.    Marland Kitchen lisinopril (ZESTRIL) 10 MG tablet Take 1 tablet (10 mg total) by mouth daily. 30 tablet 0  . methocarbamol (ROBAXIN) 500 MG tablet Take 1 tablet (500 mg total) by mouth 4 (four) times daily. 16 tablet 0  . metoprolol succinate (TOPROL-XL) 50 MG 24 hr tablet Take 1 tablet (50 mg total) by mouth daily. Take with or immediately following a meal. 30 tablet 0  . oxyCODONE-acetaminophen (PERCOCET) 5-325 MG tablet Take 1 tablet by mouth every 6 (six) hours as needed for severe pain. 8 tablet 0   No current facility-administered medications for this visit.    Allergies:   Latex    Social History:  The patient  reports that she has never smoked. She has never used smokeless tobacco.  She reports current alcohol use. She reports that she does not use drugs.   Family History:  [name] ***family history includes Clotting disorder in her maternal grandmother; Diabetes in her mother; Glaucoma in her mother; Heart disease in her father; Hypertension in her mother.    ROS:  Please see the history of present  illness.   Otherwise, review of systems are positive for {NONE DEFAULTED:18576::"none"}.   All other systems are reviewed and negative.    PHYSICAL EXAM: VS:  LMP 03/23/2017  , BMI There is no height or weight on file to calculate BMI. GENERAL:  Well appearing HEENT:  Pupils equal round and reactive, fundi not visualized, oral mucosa unremarkable NECK:  No jugular venous distention, waveform within normal limits, carotid upstroke brisk and symmetric, no bruits, no thyromegaly LYMPHATICS:  No cervical, inguinal adenopathy LUNGS:  Clear to auscultation bilaterally BACK:  No CVA tenderness CHEST:  Unremarkable HEART:  PMI not displaced or sustained,S1 and S2 within normal limits, no S3, no S4, no clicks, no rubs, *** murmurs ABD:  Flat, positive bowel sounds normal in frequency in pitch, no bruits, no rebound, no guarding, no midline pulsatile mass, no hepatomegaly, no splenomegaly EXT:  2 plus pulses throughout, no edema, no cyanosis no clubbing SKIN:  No rashes no nodules NEURO:  Cranial nerves II through XII grossly intact, motor grossly intact throughout PSYCH:  Cognitively intact, oriented to person place and time    EKG:  EKG {ACTION; IS/IS ONG:29528413} ordered today. The ekg ordered today demonstrates ***   Recent Labs: 09/02/2019: TSH 0.985 09/03/2019: Magnesium 2.1 09/10/2019: ALT 33; BUN 8; Creatinine, Ser 0.71; Hemoglobin 15.2; Platelets 316; Potassium 3.8; Sodium 139    Lipid Panel No results found for: CHOL, TRIG, HDL, CHOLHDL, VLDL, LDLCALC, LDLDIRECT    Wt Readings from Last 3 Encounters:  09/10/19 249 lb 12.5 oz (113.3 kg)  09/03/19 249 lb 12.5 oz (113.3 kg)  06/04/19 230 lb (104.3 kg)      Other studies Reviewed: Additional studies/ records that were reviewed today include: ***. Review of the above records demonstrates:  Please see elsewhere in the note.  ***   ASSESSMENT AND PLAN:  ***   Current medicines are reviewed at length with the patient today.   The patient {ACTIONS; HAS/DOES NOT HAVE:19233} concerns regarding medicines.  The following changes have been made:  {PLAN; NO CHANGE:13088:s}  Labs/ tests ordered today include: *** No orders of the defined types were placed in this encounter.    Disposition:   FU with ***    Signed, Minus Breeding, MD  09/11/2019 6:57 PM    [facility]

## 2019-09-12 ENCOUNTER — Ambulatory Visit: Payer: BLUE CROSS/BLUE SHIELD | Admitting: Cardiology

## 2019-09-12 ENCOUNTER — Telehealth: Payer: Self-pay | Admitting: Cardiology

## 2019-09-12 NOTE — Telephone Encounter (Signed)
        I went in pt's chart to see what her primary said about her following up with her Cardiologist

## 2019-09-12 NOTE — Telephone Encounter (Signed)
      I went in pt's chart to see who called her today. I did not see where anybody called her today.

## 2019-09-29 ENCOUNTER — Ambulatory Visit: Payer: BLUE CROSS/BLUE SHIELD | Admitting: Cardiology

## 2019-10-02 NOTE — Progress Notes (Addendum)
Cardiology Office Note Date:  10/03/2019  Patient ID:  Jamel Dunton, DOB 1965-07-25, MRN [id number] PCP:  System, Pcp Not In  Cardiologist:  Dr. Moshe Cipro [facility])     Chief Complaint: EP consult SVT vs VT  History of Present Illness: Cyndra Feinberg is a 54 y.o. female with history of SVT, HTN, DVT 2018 (post ankle injury, no longer on a/c).  Thew patient 09/03/2019 sought medical attention at [facility] with palpitations associated with weakness, SOB, CP. ER records report that she was driving pulled into a police station feeling poorly, was noted diaphoretic and SOB, EMS was called and reported she was  In VT for 3 minutes given amio bolus and started on a gtt. In the ER, notes report she was in VT associated with dizziness treated with amiodarone 150 > procainamide 100mg  x2 VT procainamide gtt, magnesium.  Arrhythmia stopped and procainamide stopped. It seems then she was given IV amio bolus of 300mg  and gtt with only short runs of VT then went into an SVT and was given adenosine and continued on amio K+ came back at 2.5, mag ws 1.5  EMS record reviewed, EKGs c/w VT, mention the pt was awake and asked them not to shock her.  Cardiology consult done, noting review of prior cardiology records in care everywhere with known h/o SVT historically had success with vagal maneuvers and adenosine with some discussion of getting an ablation done.  Pt reported a recent stress test about a year prior as well.  History obtained that her symptoms were similar/same as her SVT. Outside of this she exercises routinely without difficulties or symptoms. Her HS troponin was mildly elevated and felt to be demand.  Planned for echo. She was seen the following day, echo still pending, cleared for discharge once done, and if normal not felt to need ischemic evaluation given that she exercises without symptoms.  Her BB dose was increased. K+ 2.5 > 4.6 > 4.0 Mag 1.5 > 2.1 HS Trop 6 > 169 TSH  0.985 TTE noted LVEF 60-65%, Grade II DD  Note an er visit for leg pain 09/10/19 her K+ 3.8  She has seen her cardiologist 09/21/19 Dr. Moshe Cipro [facility]) notes report doing well with no recent arrhythmia issues planned for EP referral HTN meds adjusted Follow up 09/28/19 again mentions no recent issues with her tachycardia, BP better with salt restriction and diretic and only taking actively lisinopril 20mg  daily and metoprolol 50mg  BID    ER visits  06/04/19 HTN, reported taking meds, had just saw her cardiologist, started on norvasc, 153/94 05/12/19 HTN out of med 1 week, 201/115 08/25/2018, tachycardia, 144/116, 189bpm by EMS c/w SVT broke getting IV placed reported compliance w/her metoprolol 09/23/17: 132/101, 190pm adenosine x2 > SR  Since her last medicine adjustment to the current dose she feels well. Until then she was having a slight fluttering in her throat that usually is the 1st thing she feels prior to her tachycardia starting.   She states the day she went to the hospital in the tachycardia is the worst she has ever felt with her SVT, very weak, but not presyncopal, and heavy in her chest.  She reports typically she feels her chest a bit tight when in her SVT but this last episode was much worse.  She tends to have a lot of gas (flatulance) and occasionally heartburn, bo CP otherwise.  She has started a pretty rigorous exercise program a few months back and does not get any  CP or note any exertional intolerances with it or otherwise.  She has not had syncope.  She will occasionally get a "rush" upon standing from sitting.  Leading to her last tachycardia  And noted low mag and potassium, she mentions she had been on abn antibiotic for her periodontal disease and developed diarrhea with it.  She has completed the antibiotic for several weeks though c/w nearly daily diarrhea. She has been off the HCTZ and on spironolactone now since then as well.  She gave up caffeine years ago though a  few dasy leading to her tlast tachycardia she had been drinking coke, she drinks beer, maybe 2 on a couple days a week, not reguilarly.   Past Medical History:  Diagnosis Date  . Anemia due to blood loss, chronic    due to menorrhagia  . Back pain   . Bladder infection   . Chicken pox   . Fibroid uterus   . Hypertension   . SVT (supraventricular tachycardia) (HCC)     Past Surgical History:  Procedure Laterality Date  . ABDOMINAL HYSTERECTOMY    . EMBOLIZATION Bilateral 07/18/2016   Procedure: Embolization, uterine arteries;  Surgeon: Algernon Huxley, MD;  Location: [facility];  Service: Cardiovascular;  Laterality: Bilateral;  . HYSTEROSCOPY WITH D & C  10/2014   [facility], Dr Guadalupe Maple  . none      Current Outpatient Medications  Medication Sig Dispense Refill  . amLODipine (NORVASC) 10 MG tablet Take 10 mg by mouth daily.    Marland Kitchen lisinopril (ZESTRIL) 20 MG tablet Take 20 mg by mouth daily.    . methocarbamol (ROBAXIN) 500 MG tablet Take 1 tablet (500 mg total) by mouth 4 (four) times daily. 16 tablet 0  . metoprolol tartrate (LOPRESSOR) 50 MG tablet Take 100 mg by mouth 2 (two) times daily.    Marland Kitchen spironolactone (ALDACTONE) 25 MG tablet Take 25 mg by mouth daily.     No current facility-administered medications for this visit.    Allergies:   Latex   Social History:  The patient  reports that she has never smoked. She has never used smokeless tobacco. She reports current alcohol use. She reports that she does not use drugs.   Family History:  [name] family history includes Clotting disorder in her maternal grandmother; Diabetes in her mother; Glaucoma in her mother; Heart disease in her father; Hypertension in her mother.  ROS:  Please see the history of present illness.   All other systems are reviewed and otherwise negative.   PHYSICAL EXAM:  VS:  BP 126/74   Pulse 82   Ht 5\' 6"  (1.676 m)   Wt 250 lb (113.4 kg)   LMP 03/23/2017   SpO2 96%   BMI 40.35 kg/m  BMI:  Body mass index is 40.35 kg/m. Well nourished, well developed, in no acute distress  HEENT: normocephalic, atraumatic  Neck: no JVD, carotid bruits or masses Cardiac:  RRR; no significant murmurs, no rubs, or gallops Lungs:  CTAb/l, no wheezing, rhonchi or rales  Abd: soft, nontender MS: no deformity or  atrophy Ext:  no edema  Skin: warm and dry, no rash Neuro:  No gross deficits appreciated Psych: euthymic mood, full affect     EKG: not done today Hospital and EMS EKGs are reviewed with Dr. Curt Bears  09/04/2019: TTE IMPRESSIONS  1. Left ventricular ejection fraction, by estimation, is 60 to 65%. The  left ventricle has normal function. The left ventricle has no  regional  wall motion abnormalities. There is mild left ventricular hypertrophy.  Left ventricular diastolic parameters  are consistent with Grade II diastolic dysfunction (pseudonormalization).  2. Right ventricular systolic function is normal. The right ventricular  size is normal. There is mildly elevated pulmonary artery systolic  pressure. The estimated right ventricular systolic pressure is 16.1 mmHg.   Recent Labs: 09/02/2019: TSH 0.985 09/03/2019: Magnesium 2.1 09/10/2019: ALT 33; BUN 8; Creatinine, Ser 0.71; Hemoglobin 15.2; Platelets 316; Potassium 3.8; Sodium 139  No results found for requested labs within last 8760 hours.   CrCl cannot be calculated (Patient's most recent lab result is older than the maximum 21 days allowed.).   Wt Readings from Last 3 Encounters:  10/03/19 250 lb (113.4 kg)  09/10/19 249 lb 12.5 oz (113.3 kg)  09/03/19 249 lb 12.5 oz (113.3 kg)     Other studies reviewed: Additional studies/records reviewed today include: summarized above  ASSESSMENT AND PLAN:  1. SVT 2. WCT  EKG from 09/23/2017 is SVT 189bpm 09/03/2019 SVT 144bpm (morphology appears like 2019) 09/02/19 SR (morphology is that of her SVT, no ischemic looking changes 09/02/19 EMS is WCT approx 200bpm, transitions  V3, small tracing, difficult to asssess axis, neg I, aVR   She was seen and examined today by Dr. Curt Bears.  Discussed WCT perhaps aberrancy of her same SVT. She has no anginal symptoms with a very rigorous exercise program, preserved LVEF, no WMA  Recommends EPS, ablation, discussed rational, the procedure, potential risks, benefits, she would like to proceed. She was given a couple dates for the procedure, she will call Dr. Macky Lower nurse once she has looked at her schedule.  Continue her lopressor unchanged for now   3. HTN     Looks good     She mentions her BP has been very good at home with her current meds     She thinks the amlodipine is what is giving her loose stools  Disposition: pre-procedure labs today including mag, f/u with Dr. Curt Bears post procedure.  Current medicines are reviewed at length with the patient today.  The patient did not have any concerns regarding medicines.  Venetia Night, PA-C 10/03/2019 3:39 PM     [place] [phone] (office)  [phone] (fax)  I have seen and examined this patient with Tommye Standard.  Agree with above, note added to reflect my findings.  On exam, RRR, no murmurs.  Patient has a history of SVT.  Present of the hospital with a wide-complex tachycardia.  This could be due to apparent SVT versus outflow tract VT.  We will plan for EP study with possible ablation.  Risks and benefits have been discussed which include bleeding, tamponade, heart block, stroke, among others.  She understands these risks and is agreed to the procedure.  Will M. Camnitz MD 10/03/2019 3:39 PM

## 2019-10-03 ENCOUNTER — Other Ambulatory Visit: Payer: Self-pay

## 2019-10-03 ENCOUNTER — Ambulatory Visit: Payer: BLUE CROSS/BLUE SHIELD | Admitting: Physician Assistant

## 2019-10-03 VITALS — BP 126/74 | HR 82 | Ht 66.0 in | Wt 250.0 lb

## 2019-10-03 DIAGNOSIS — R000 Tachycardia, unspecified: Secondary | ICD-10-CM

## 2019-10-03 DIAGNOSIS — I471 Supraventricular tachycardia, unspecified: Secondary | ICD-10-CM

## 2019-10-03 DIAGNOSIS — I10 Essential (primary) hypertension: Secondary | ICD-10-CM | POA: Diagnosis not present

## 2019-10-03 DIAGNOSIS — I472 Ventricular tachycardia: Secondary | ICD-10-CM

## 2019-10-03 LAB — CBC
Hematocrit: 48.8 % — ABNORMAL HIGH (ref 34.0–46.6)
Hemoglobin: 15.6 g/dL (ref 11.1–15.9)
MCH: 27.2 pg (ref 26.6–33.0)
MCHC: 32 g/dL (ref 31.5–35.7)
MCV: 85 fL (ref 79–97)
Platelets: 347 10*3/uL (ref 150–450)
RBC: 5.73 x10E6/uL — ABNORMAL HIGH (ref 3.77–5.28)
RDW: 12.5 % (ref 11.7–15.4)
WBC: 5.9 10*3/uL (ref 3.4–10.8)

## 2019-10-03 LAB — MAGNESIUM: Magnesium: 2 mg/dL (ref 1.6–2.3)

## 2019-10-03 LAB — BASIC METABOLIC PANEL
BUN/Creatinine Ratio: 14 (ref 9–23)
BUN: 9 mg/dL (ref 6–24)
CO2: 23 mmol/L (ref 20–29)
Calcium: 9.4 mg/dL (ref 8.7–10.2)
Chloride: 103 mmol/L (ref 96–106)
Creatinine, Ser: 0.65 mg/dL (ref 0.57–1.00)
GFR calc Af Amer: 118 mL/min/{1.73_m2} (ref >59)
GFR calc non Af Amer: 102 mL/min/{1.73_m2} (ref >59)
Glucose: 135 mg/dL — ABNORMAL HIGH (ref 65–99)
Potassium: 4.6 mmol/L (ref 3.5–5.2)
Sodium: 138 mmol/L (ref 134–144)

## 2019-10-03 NOTE — Patient Instructions (Addendum)
Medication Instructions:   Your physician recommends that you continue on your current medications as directed. Please refer to the Current Medication list given to you today.  *If you need a refill on your cardiac medications before your next appointment, please call your pharmacy*   Lab Work: [facility] [place]   If you have labs (blood work) drawn today and your tests are completely normal, you will receive your results only by: Marland Kitchen MyChart Message (if you have MyChart) OR . A paper copy in the mail If you have any lab test that is abnormal or we need to change your treatment, we will call you to review the results.   Testing/Procedures: Your physician has recommended that you have an ablation. Catheter ablation is a medical procedure used to treat some cardiac arrhythmias (irregular heartbeats). During catheter ablation, a long, thin, flexible tube is put into a blood vessel in your groin (upper thigh), or neck. This tube is called an ablation catheter. It is then guided to your heart through the blood vessel. Radio frequency waves destroy small areas of heart tissue where abnormal heartbeats may cause an arrhythmia to start. Please see the instruction sheet given to you today.  Follow-Up: At [facility], you and your health needs are our priority.  As part of our continuing mission to provide you with exceptional heart care, we have created designated Provider Care Teams.  These Care Teams include your primary Cardiologist (physician) and Advanced Practice Providers (APPs -  Physician Assistants and Nurse Practitioners) who all work together to provide you with the care you need, when you need it.  We recommend signing up for the patient portal called "MyChart".  Sign up information is provided on this After Visit Summary.  MyChart is used to connect with patients for Virtual Visits (Telemedicine).  Patients are able to view lab/test results, encounter notes, upcoming appointments, etc.   Non-urgent messages can be sent to your provider as well.   To learn more about what you can do with MyChart, go to [email or web address].    Your next appointment:    YOU WILL BE  CONTACTED  BACK BY NURSE SHERRI ABOUT FURTHER STEPS FOR EP CARDIOVERSION AND FOLLOW UP    Provider:   You may see Dr. Shonna Chock    Cardiac Ablation Cardiac ablation is a procedure to disable (ablate) a small amount of heart tissue in very specific places. The heart has many electrical connections. Sometimes these connections are abnormal and can cause the heart to beat very fast or irregularly. Ablating some of the problem areas can improve the heart rhythm or return it to normal. Ablation may be done for people who:  Have Wolff-Parkinson-White syndrome.  Have fast heart rhythms (tachycardia).  Have taken medicines for an abnormal heart rhythm (arrhythmia) that were not effective or caused side effects.  Have a high-risk heartbeat that may be life-threatening. During the procedure, a small incision is made in the neck or the groin, and a long, thin, flexible tube (catheter) is inserted into the incision and moved to the heart. Small devices (electrodes) on the tip of the catheter will send out electrical currents. A type of X-ray (fluoroscopy) will be used to help guide the catheter and to provide images of the heart. Tell a health care provider about:  Any allergies you have.  All medicines you are taking, including vitamins, herbs, eye drops, creams, and over-the-counter medicines.  Any problems you or family members have had with anesthetic medicines.  Any blood disorders you have.  Any surgeries you have had.  Any medical conditions you have, such as kidney failure.  Whether you are pregnant or may be pregnant. What are the risks? Generally, this is a safe procedure. However, problems may occur, including:  Infection.  Bruising and bleeding at the catheter insertion site.  Bleeding into  the chest, especially into the sac that surrounds the heart. This is a serious complication.  Stroke or blood clots.  Damage to other structures or organs.  Allergic reaction to medicines or dyes.  Need for a permanent pacemaker if the normal electrical system is damaged. A pacemaker is a small computer that sends electrical signals to the heart and helps your heart beat normally.  The procedure not being fully effective. This may not be recognized until months later. Repeat ablation procedures are sometimes required. What happens before the procedure?  Follow instructions from your health care provider about eating or drinking restrictions.  Ask your health care provider about: ? Changing or stopping your regular medicines. This is especially important if you are taking diabetes medicines or blood thinners. ? Taking medicines such as aspirin and ibuprofen. These medicines can thin your blood. Do not take these medicines before your procedure if your health care provider instructs you not to.  Plan to have someone take you home from the hospital or clinic.  If you will be going home right after the procedure, plan to have someone with you for 24 hours. What happens during the procedure?  To lower your risk of infection: ? Your health care team will wash or sanitize their hands. ? Your skin will be washed with soap. ? Hair may be removed from the incision area.  An IV tube will be inserted into one of your veins.  You will be given a medicine to help you relax (sedative).  The skin on your neck or groin will be numbed.  An incision will be made in your neck or your groin.  A needle will be inserted through the incision and into a large vein in your neck or groin.  A catheter will be inserted into the needle and moved to your heart.  Dye may be injected through the catheter to help your surgeon see the area of the heart that needs treatment.  Electrical currents will be sent  from the catheter to ablate heart tissue in desired areas. There are three types of energy that may be used to ablate heart tissue: ? Heat (radiofrequency energy). ? Laser energy. ? Extreme cold (cryoablation).  When the necessary tissue has been ablated, the catheter will be removed.  Pressure will be held on the catheter insertion area to prevent excessive bleeding.  A bandage (dressing) will be placed over the catheter insertion area. The procedure may vary among health care providers and hospitals. What happens after the procedure?  Your blood pressure, heart rate, breathing rate, and blood oxygen level will be monitored until the medicines you were given have worn off.  Your catheter insertion area will be monitored for bleeding. You will need to lie still for a few hours to ensure that you do not bleed from the catheter insertion area.  Do not drive for 24 hours or as long as directed by your health care provider. Summary  Cardiac ablation is a procedure to disable (ablate) a small amount of heart tissue in very specific places. Ablating some of the problem areas can improve the heart rhythm or return it  to normal.  During the procedure, electrical currents will be sent from the catheter to ablate heart tissue in desired areas. This information is not intended to replace advice given to you by your health care provider. Make sure you discuss any questions you have with your health care provider. Document Revised: 08/24/2017 Document Reviewed: 01/21/2016 Elsevier Patient Education  [place].

## 2019-10-07 ENCOUNTER — Telehealth: Payer: Self-pay | Admitting: *Deleted

## 2019-10-07 NOTE — Telephone Encounter (Signed)
Scheduled SVT ablation for 8/6. Procedure instructions reviewed. Aware office will call to schedule post ablation follow up Patient verbalized understanding and agreeable to plan.

## 2019-10-18 NOTE — Telephone Encounter (Addendum)
Pt called and I spoke with her re: her upcoming 10/21/19 SVT Ablation... she has some concerns since she had a freid that ended up with a Paceamker after she had several asblations.. she is aksing to talk with Dr. Curt Bears about the procedure briefly prior to her having it..she did not want to cancel with the hopes that she could talk with him about it prior.   I tried to put her mind at ease but she requests that I forward to Dr. Curt Bears for his review and possibly set up a time that she could talk with him over the phone about a few added questions about the procedure.

## 2019-10-18 NOTE — Telephone Encounter (Signed)
Patient is calling to follow up in regards to SVT Ablation scheduled for 10/21/19. She states she is having second thoughts about having the procedure and she would like to discuss alternatives with Dr. Macky Lower nurse.

## 2019-10-20 NOTE — Telephone Encounter (Signed)
Note: Dr. Curt Bears had spoken with the pt and she reported that she was called by someone in the [facility] system and advised that with her insurance she was unable to have her procedure here and will be going to [facility].

## 2019-10-21 ENCOUNTER — Ambulatory Visit (HOSPITAL_COMMUNITY): Admission: RE | Admit: 2019-10-21 | Payer: BLUE CROSS/BLUE SHIELD | Source: Home / Self Care | Admitting: Cardiology

## 2019-10-21 ENCOUNTER — Encounter (HOSPITAL_COMMUNITY): Admission: RE | Payer: Self-pay | Source: Home / Self Care

## 2019-10-21 SURGERY — SVT ABLATION

## 2020-07-16 ENCOUNTER — Other Ambulatory Visit: Payer: Self-pay

## 2020-07-16 ENCOUNTER — Emergency Department: Payer: BLUE CROSS/BLUE SHIELD

## 2020-07-16 ENCOUNTER — Emergency Department
Admission: EM | Admit: 2020-07-16 | Discharge: 2020-07-16 | Disposition: A | Discharge status: Home / Self Care | Payer: BLUE CROSS/BLUE SHIELD | Attending: Emergency Medicine | Admitting: Emergency Medicine

## 2020-07-16 DIAGNOSIS — I10 Essential (primary) hypertension: Secondary | ICD-10-CM | POA: Insufficient documentation

## 2020-07-16 DIAGNOSIS — R000 Tachycardia, unspecified: Secondary | ICD-10-CM | POA: Insufficient documentation

## 2020-07-16 DIAGNOSIS — Z79899 Other long term (current) drug therapy: Secondary | ICD-10-CM | POA: Diagnosis not present

## 2020-07-16 DIAGNOSIS — R002 Palpitations: Secondary | ICD-10-CM | POA: Diagnosis not present

## 2020-07-16 DIAGNOSIS — Z91040 Latex allergy status: Secondary | ICD-10-CM | POA: Diagnosis not present

## 2020-07-16 DIAGNOSIS — F606 Avoidant personality disorder: Secondary | ICD-10-CM | POA: Diagnosis not present

## 2020-07-16 DIAGNOSIS — R070 Pain in throat: Secondary | ICD-10-CM | POA: Diagnosis not present

## 2020-07-16 DIAGNOSIS — R079 Chest pain, unspecified: Secondary | ICD-10-CM | POA: Insufficient documentation

## 2020-07-16 LAB — CBC
HCT: 49.4 % — ABNORMAL HIGH (ref 36.0–46.0)
Hemoglobin: 15.6 g/dL — ABNORMAL HIGH (ref 12.0–15.0)
MCH: 27.8 pg (ref 26.0–34.0)
MCHC: 31.6 g/dL (ref 30.0–36.0)
MCV: 88.1 fL (ref 80.0–100.0)
Platelets: 310 10*3/uL (ref 150–400)
RBC: 5.61 MIL/uL — ABNORMAL HIGH (ref 3.87–5.11)
RDW: 14.9 % (ref 11.5–15.5)
WBC: 4.5 10*3/uL (ref 4.0–10.5)
nRBC: 0 % (ref 0.0–0.2)

## 2020-07-16 LAB — BASIC METABOLIC PANEL
Anion gap: 9 (ref 5–15)
BUN: 11 mg/dL (ref 6–20)
CO2: 24 mmol/L (ref 22–32)
Calcium: 9.1 mg/dL (ref 8.9–10.3)
Chloride: 103 mmol/L (ref 98–111)
Creatinine, Ser: 0.71 mg/dL (ref 0.44–1.00)
GFR, Estimated: >60 mL/min (ref >60)
Glucose, Bld: 186 mg/dL — ABNORMAL HIGH (ref 70–99)
Potassium: 3.5 mmol/L (ref 3.5–5.1)
Sodium: 136 mmol/L (ref 135–145)

## 2020-07-16 LAB — TROPONIN I (HIGH SENSITIVITY)
Troponin I (High Sensitivity): 3 ng/L (ref <18)
Troponin I (High Sensitivity): 3 ng/L (ref <18)

## 2020-07-16 NOTE — ED Notes (Signed)
Says she has tachycardia this am.  Michela Pitcher she did drink alcohol over the weekend, and she may be dehydrated.  Also says her bp was up.  Says she started feeling better when she found out her ekg was okay.  She is alert and in nad.  No n/v

## 2020-07-16 NOTE — Discharge Instructions (Addendum)
To stay hydrated and take your metoprolol as prescribed.  Avoid heavy alcohol use.  Follow-up with your primary care doctor.  Return to the ER for new, worsening, or persistent severe palpitations, high heart rate, severe high blood pressure, weakness or lightheadedness, chest pain, or any other new or worsening symptoms that concern you.

## 2020-07-16 NOTE — ED Triage Notes (Signed)
Pt comes via EMS from home with c/o tachycardia, hypertension. Pt states she had some drinks this weekend and feels that could have made her Bp and HR elevated.   Pt states some chest indigestion earlier this morning. Pt states BP was elevated and HR so she wanted to get checked out.

## 2020-07-16 NOTE — ED Provider Notes (Signed)
[facility] Emergency Department Provider Note ____________________________________________   Event Date/Time   First MD Initiated Contact with Patient 07/16/20 1049     (approximate)  I have reviewed the triage vital signs and the nursing notes.   HISTORY  Chief Complaint Tachycardia    HPI Kaitlin Brown is a 55 y.o. female with PMH as noted below including anemia, ventricular tachycardia, and DVT (several years ago, provoked, no longer on anticoagulation) who presents with an episode of palpitations and elevated heart rate after she awoke this morning.  The patient states that she woke up with some burning in her throat and chest that felt like indigestion or reflux.  She then noted some palpitations and checked her pulse and blood pressure.  She noted the blood pressure to be in the 150s/90s and the heart rate around 110.  She then became anxious and concerned, and the blood pressure and heart rate elevated further.  She states that the symptoms have now resolved once she found out she had a normal EKG.  She states that this has happened occasionally in the past.  The patient reports that she was drinking heavily over the last several days while attending several social gatherings, likely more than 5 drinks per day.  She also states that she was off her metoprolol for few days last week after she was waiting on the refill.    Past Medical History:  Diagnosis Date  . Anemia due to blood loss, chronic    due to menorrhagia  . Back pain   . Bladder infection   . Chicken pox   . Fibroid uterus   . Hypertension   . SVT (supraventricular tachycardia) [facility])     Patient Active Problem List   Diagnosis Date Noted  . Ventricular tachycardia ([place]) 09/03/2019  . Anemia 07/17/2016  . Anemia due to chronic blood loss 07/16/2016  . Acute pulmonary embolism ([place]) 03/17/2016  . DVT (deep venous thrombosis) ([place]) 03/17/2016  . Fibroid uterus   .  Anemia due to blood loss, chronic   . Symptomatic anemia 09/28/2015  . Uterine mass 09/28/2015    Past Surgical History:  Procedure Laterality Date  . ABDOMINAL HYSTERECTOMY    . EMBOLIZATION Bilateral 07/18/2016   Procedure: Embolization, uterine arteries;  Surgeon: Algernon Huxley, MD;  Location: [place] [facility];  Service: Cardiovascular;  Laterality: Bilateral;  . HYSTEROSCOPY WITH D & C  10/2014   [facility], Dr Guadalupe Maple  . none      Prior to Admission medications   Medication Sig Start Date End Date Taking? Authorizing Provider  amLODipine (NORVASC) 10 MG tablet Take 1 tablet by mouth daily. 05/08/20  Yes [provider]  DULoxetine (CYMBALTA) 30 MG capsule Take 1 capsule by mouth daily. 05/09/20 05/09/21 Yes [provider]  meloxicam (MOBIC) 7.5 MG tablet Take one/day; max take 2/day if necessary 05/09/20  Yes [provider]  pregabalin (LYRICA) 25 MG capsule Take by mouth. 04/19/20 04/19/21 Yes [provider]  lisinopril (ZESTRIL) 20 MG tablet Take 20 mg by mouth daily. 09/21/19 10/21/19  [provider]  methocarbamol (ROBAXIN) 500 MG tablet Take 1 tablet (500 mg total) by mouth 4 (four) times daily. 09/10/19   Carrie Mew, MD  metoprolol tartrate (LOPRESSOR) 50 MG tablet Take 100 mg by mouth 2 (two) times daily. 09/21/19   [provider]  spironolactone (ALDACTONE) 25 MG tablet Take 25 mg by mouth daily. 09/21/19 10/21/19  [provider]  Allergies Latex  Family History  Problem Relation Age of Onset  . Hypertension Mother   . Diabetes Mother   . Glaucoma Mother   . Heart disease Father        Congestive heart condition  . Clotting disorder Maternal Grandmother     Social History Social History   Tobacco Use  . Smoking status: Never Smoker  . Smokeless tobacco: Never Used  Vaping Use  . Vaping Use: Never used  Substance Use Topics  . Alcohol use: Yes    Comment: occasionally  . Drug use: No    Review of  Systems  Constitutional: No fever/chills. Eyes: No redness. ENT: No sore throat. Cardiovascular: Positive for resolved chest discomfort. Respiratory: Denies shortness of breath. Gastrointestinal: No vomiting or diarrhea.  Genitourinary: Negative for flank pain.  Musculoskeletal: Negative for back pain. Skin: Negative for rash. Neurological: Negative for headaches, focal weakness or numbness.   ____________________________________________   PHYSICAL EXAM:  VITAL SIGNS: ED Triage Vitals  Enc Vitals Group     BP 07/16/20 0849 (!) 147/95     Pulse Rate 07/16/20 0849 (!) 110     Resp 07/16/20 0849 18     Temp 07/16/20 0849 98.4 F (36.9 C)     Temp Source 07/16/20 0849 Oral     SpO2 07/16/20 0849 96 %     Weight 07/16/20 0849 240 lb (108.9 kg)     Height 07/16/20 0849 5\' 6"  (1.676 m)     Head Circumference --      Peak Flow --      Pain Score 07/16/20 0849 0     Pain Loc --      Pain Edu? --      Excl. in [place]? --     Constitutional: Alert and oriented. Well appearing and in no acute distress. Eyes: Conjunctivae are normal.  Head: Atraumatic. Nose: No congestion/rhinnorhea. Mouth/Throat: Mucous membranes are moist.   Neck: Normal range of motion.  Cardiovascular: Normal rate, regular rhythm. Grossly normal heart sounds.  Good peripheral circulation. Respiratory: Normal respiratory effort.  No retractions. Lungs CTAB. Gastrointestinal: No distention.  Musculoskeletal: No lower extremity edema.  No calf or popliteal swelling or tenderness.  Extremities warm and well perfused.  Neurologic:  Normal speech and language. No gross focal neurologic deficits are appreciated.  Skin:  Skin is warm and dry. No rash noted. Psychiatric: Mood and affect are normal. Speech and behavior are normal.  ____________________________________________   LABS (all labs ordered are listed, but only abnormal results are displayed)  Labs Reviewed  BASIC METABOLIC PANEL - Abnormal; Notable for  the following components:      Result Value   Glucose, Bld 186 (*)    All other components within normal limits  CBC - Abnormal; Notable for the following components:   RBC 5.61 (*)    Hemoglobin 15.6 (*)    HCT 49.4 (*)    All other components within normal limits  POC URINE PREG, ED  TROPONIN I (HIGH SENSITIVITY)  TROPONIN I (HIGH SENSITIVITY)   ____________________________________________  EKG  ED ECG REPORT I, Arta Silence, the attending physician, personally viewed and interpreted this ECG.  Date: 07/16/2020 EKG Time: 0845 Rate: 113 Rhythm: Sinus tachycardia QRS Axis: normal Intervals: normal ST/T Wave abnormalities: normal Narrative Interpretation: no evidence of acute ischemia  ____________________________________________  RADIOLOGY  Chest x-ray interpreted by me shows no focal infiltrate or edema  ____________________________________________   PROCEDURES  Procedure(s) performed: No  Procedures  Critical  Care performed: No ____________________________________________   INITIAL IMPRESSION / ASSESSMENT AND PLAN / ED COURSE  Pertinent labs & imaging results that were available during my care of the patient were reviewed by me and considered in my medical decision making (see chart for details).  55 year old female with PMH as noted above presents with an episode of palpitations and tachycardia when she awoke this morning initially associated with some reflux type chest discomfort.  She then became anxious and noted a further increase in her heart rate and blood pressure.  The symptoms have now resolved.  This is occurring in the context of self-reported relatively heavy drinking over the last several days.  On exam, the patient is well-appearing.  Her vital signs are currently normal with a heart rate during my exam in the high 90s.  Physical exam is otherwise unremarkable.  EKG shows sinus tachycardia with no ischemic findings.  Overall  presentation is consistent with sinus tachycardia likely due to dehydration/hypovolemia.  I suspect that this was caused by the patient's relatively heavy alcohol use over the last several days and what sounds like some missed doses of metoprolol.  There is no evidence of atrial fibrillation or other cardiac arrhythmia.  I have a low suspicion for ACS.  The chest discomfort is consistent with GERD.  Although the patient does have a history of DVT, it was provoked and occurred several years ago.  She has no leg swelling or DVT symptoms at this time.  Given that the chest discomfort and tachycardia have both resolved without intervention and the patient has no shortness of breath, hypoxia, or other concerning symptoms, there is no clinical evidence for PE or indication for further work-up.  Basic labs are unremarkable.  Initial troponin is negative.  We will obtain a second troponin.  I anticipate discharge home.  ----------------------------------------- 12:27 PM on 07/16/2020 -----------------------------------------  The vital signs have completely normalized without any intervention.  Repeat troponin is negative.  At this time, there continues to be no evidence of cardiac cause or PE.  The patient is stable for discharge home.  I counseled her on the results of the work-up.  Return precautions given, and she expresses understanding.  ____________________________________________   FINAL CLINICAL IMPRESSION(S) / ED DIAGNOSES  Final diagnoses:  Sinus tachycardia  Palpitations      NEW MEDICATIONS STARTED DURING THIS VISIT:  New Prescriptions   No medications on file     Note:  This document was prepared using Dragon voice recognition software and may include unintentional dictation errors.   Arta Silence, MD 07/16/20 1228

## 2020-07-31 ENCOUNTER — Other Ambulatory Visit: Payer: Self-pay

## 2020-07-31 ENCOUNTER — Emergency Department
Admission: EM | Admit: 2020-07-31 | Discharge: 2020-07-31 | Disposition: A | Discharge status: Home / Self Care | Payer: BLUE CROSS/BLUE SHIELD | Attending: Emergency Medicine | Admitting: Emergency Medicine

## 2020-07-31 DIAGNOSIS — I10 Essential (primary) hypertension: Secondary | ICD-10-CM | POA: Insufficient documentation

## 2020-07-31 DIAGNOSIS — H9201 Otalgia, right ear: Secondary | ICD-10-CM

## 2020-07-31 DIAGNOSIS — Z91040 Latex allergy status: Secondary | ICD-10-CM | POA: Insufficient documentation

## 2020-07-31 DIAGNOSIS — J029 Acute pharyngitis, unspecified: Secondary | ICD-10-CM | POA: Insufficient documentation

## 2020-07-31 DIAGNOSIS — H6981 Other specified disorders of Eustachian tube, right ear: Secondary | ICD-10-CM | POA: Insufficient documentation

## 2020-07-31 DIAGNOSIS — Z79899 Other long term (current) drug therapy: Secondary | ICD-10-CM | POA: Insufficient documentation

## 2020-07-31 MED ORDER — MAGIC MOUTHWASH
10.0000 mL | Freq: Once | ORAL | Status: AC
  Administered 2020-07-31: 10 mL via ORAL
  Filled 2020-07-31 (×2): qty 10

## 2020-07-31 MED ORDER — MAGIC MOUTHWASH: 5.0000 mL | Freq: Three times a day (TID) | ORAL | 0 refills | Status: DC | PRN

## 2020-07-31 MED ORDER — PREDNISONE 20 MG PO TABS
30.0000 mg | ORAL_TABLET | Freq: Once | ORAL | Status: AC
  Administered 2020-07-31: 30 mg via ORAL
  Filled 2020-07-31: qty 1

## 2020-07-31 MED ORDER — AMOXICILLIN 500 MG PO CAPS
500.0000 mg | ORAL_CAPSULE | Freq: Once | ORAL | Status: AC
  Administered 2020-07-31: 500 mg via ORAL
  Filled 2020-07-31: qty 1

## 2020-07-31 MED ORDER — METHYLPREDNISOLONE 4 MG PO TBPK: ORAL_TABLET | ORAL | 0 refills | Status: DC

## 2020-07-31 MED ORDER — AMOXICILLIN 500 MG PO CAPS: 500.0000 mg | ORAL_CAPSULE | Freq: Three times a day (TID) | ORAL | 0 refills | Status: DC

## 2020-07-31 NOTE — ED Provider Notes (Signed)
[facility] Emergency Department Provider Note   ____________________________________________   Event Date/Time   First MD Initiated Contact with Patient 07/31/20 [phone]     (approximate)  I have reviewed the triage vital signs and the nursing notes.   HISTORY  Chief Complaint Sore Throat and Otalgia    HPI Kaitlin Brown is a 55 y.o. female who presents to the ED from home with a chief complaint of sore throat and right ear pain.  Onset of symptoms yesterday.  History of strep throat in the past.  Denies fever, chills, cough, chest pain, shortness of breath, abdominal pain, nausea, vomiting or dizziness.  Denies barotrauma.  Denies ear discharge.     Past Medical History:  Diagnosis Date  . Anemia due to blood loss, chronic    due to menorrhagia  . Back pain   . Bladder infection   . Chicken pox   . Fibroid uterus   . Hypertension   . SVT (supraventricular tachycardia) [facility])     Patient Active Problem List   Diagnosis Date Noted  . Ventricular tachycardia ([place]) 09/03/2019  . Anemia 07/17/2016  . Anemia due to chronic blood loss 07/16/2016  . Acute pulmonary embolism ([place]) 03/17/2016  . DVT (deep venous thrombosis) ([place]) 03/17/2016  . Fibroid uterus   . Anemia due to blood loss, chronic   . Symptomatic anemia 09/28/2015  . Uterine mass 09/28/2015    Past Surgical History:  Procedure Laterality Date  . ABDOMINAL HYSTERECTOMY    . EMBOLIZATION Bilateral 07/18/2016   Procedure: Embolization, uterine arteries;  Surgeon: Algernon Huxley, MD;  Location: [place] [facility];  Service: Cardiovascular;  Laterality: Bilateral;  . HYSTEROSCOPY WITH D & C  10/2014   [facility], Dr Guadalupe Maple  . none      Prior to Admission medications   Medication Sig Start Date End Date Taking? Authorizing Provider  amoxicillin (AMOXIL) 500 MG capsule Take 1 capsule (500 mg total) by mouth 3 (three) times daily. 07/31/20  Yes Paulette Blanch, MD  magic mouthwash SOLN  Take 5 mLs by mouth 3 (three) times daily as needed for mouth pain. 07/31/20  Yes Paulette Blanch, MD  methylPREDNISolone (MEDROL DOSEPAK) 4 MG TBPK tablet Take as directed 07/31/20  Yes Paulette Blanch, MD  amLODipine (NORVASC) 10 MG tablet Take 1 tablet by mouth daily. 05/08/20   [provider]  DULoxetine (CYMBALTA) 30 MG capsule Take 1 capsule by mouth daily. 05/09/20 05/09/21  [provider]  lisinopril (ZESTRIL) 20 MG tablet Take 20 mg by mouth daily. 09/21/19 10/21/19  [provider]  meloxicam (MOBIC) 7.5 MG tablet Take one/day; max take 2/day if necessary 05/09/20   [provider]  methocarbamol (ROBAXIN) 500 MG tablet Take 1 tablet (500 mg total) by mouth 4 (four) times daily. 09/10/19   Carrie Mew, MD  metoprolol tartrate (LOPRESSOR) 50 MG tablet Take 100 mg by mouth 2 (two) times daily. 09/21/19   [provider]  pregabalin (LYRICA) 25 MG capsule Take by mouth. 04/19/20 04/19/21  [provider]  spironolactone (ALDACTONE) 25 MG tablet Take 25 mg by mouth daily. 09/21/19 10/21/19  [provider]    Allergies Latex  Family History  Problem Relation Age of Onset  . Hypertension Mother   . Diabetes Mother   . Glaucoma Mother   . Heart disease Father        Congestive heart condition  . Clotting disorder Maternal Grandmother  Social History Social History   Tobacco Use  . Smoking status: Never Smoker  . Smokeless tobacco: Never Used  Vaping Use  . Vaping Use: Never used  Substance Use Topics  . Alcohol use: Yes    Comment: occasionally  . Drug use: No    Review of Systems  Constitutional: No fever/chills Eyes: No visual changes. ENT: Positive for right ear pain and sore throat. Cardiovascular: Denies chest pain. Respiratory: Denies shortness of breath. Gastrointestinal: No abdominal pain.  No nausea, no vomiting.  No diarrhea.  No constipation. Genitourinary: Negative for dysuria. Musculoskeletal: Negative for  back pain. Skin: Negative for rash. Neurological: Negative for headaches, focal weakness or numbness.   ____________________________________________   PHYSICAL EXAM:  VITAL SIGNS: ED Triage Vitals  Enc Vitals Group     BP 07/31/20 0041 (!) 144/107     Pulse Rate 07/31/20 0041 82     Resp 07/31/20 0041 20     Temp 07/31/20 0041 98.4 F (36.9 C)     Temp Source 07/31/20 0041 Oral     SpO2 07/31/20 0041 97 %     Weight 07/31/20 0040 250 lb (113.4 kg)     Height 07/31/20 0040 5\' 6"  (1.676 m)     Head Circumference --      Peak Flow --      Pain Score 07/31/20 0040 8     Pain Loc --      Pain Edu? --      Excl. in [place]? --     Constitutional: Alert and oriented. Well appearing and in no acute distress. Eyes: Conjunctivae are normal. PERRL. EOMI. Head: Atraumatic. Ears: Right TM bulging without perforation or erythema. Nose: No congestion/rhinnorhea. Mouth/Throat: Mucous membranes are moist.  Oropharynx moderately erythematous without tonsillar swelling, exudates peritonsillar abscess.  There is no hoarse or muffled voice.  There is no drooling. Neck: No stridor.  Supple neck without meningismus. Hematological/Lymphatic/Immunilogical: [place] right anterior cervical lymphadenopathy. Cardiovascular: Normal rate, regular rhythm. Grossly normal heart sounds.  Good peripheral circulation. Respiratory: Normal respiratory effort.  No retractions. Lungs CTAB. Gastrointestinal: Soft and nontender. No distention. No abdominal bruits. No CVA tenderness. Musculoskeletal: No lower extremity tenderness nor edema.  No joint effusions. Neurologic:  Normal speech and language. No gross focal neurologic deficits are appreciated. No gait instability. Skin:  Skin is warm, dry and intact. No rash noted.  No petechiae. Psychiatric: Mood and affect are normal. Speech and behavior are normal.  ____________________________________________   LABS (all labs ordered are listed, but only abnormal results  are displayed)  Labs Reviewed - No data to display ____________________________________________  EKG  None ____________________________________________  RADIOLOGY I, Alaija Ruble J, personally viewed and evaluated these images (plain radiographs) as part of my medical decision making, as well as reviewing the written report by the radiologist.  ED MD interpretation: None  Official radiology report(s): No results found.  ____________________________________________   PROCEDURES  Procedure(s) performed (including Critical Care):  Procedures   ____________________________________________   INITIAL IMPRESSION / ASSESSMENT AND PLAN / ED COURSE  As part of my medical decision making, I reviewed the following data within the [place] notes reviewed and incorporated, Old chart reviewed and Notes from prior ED visits     55 year old female presenting with sore throat and otalgia.  Will start amoxicillin, Magic mouthwash, steroid taper for eustachian tube dysfunction.  Strict return precautions given.  Patient verbalizes understanding agrees with plan of care.      ____________________________________________  FINAL CLINICAL IMPRESSION(S) / ED DIAGNOSES  Final diagnoses:  Pharyngitis, unspecified etiology  Right ear pain  Dysfunction of right eustachian tube  Sore throat     ED Discharge Orders         Ordered    amoxicillin (AMOXIL) 500 MG capsule  3 times daily        07/31/20 0053    magic mouthwash SOLN  3 times daily PRN        07/31/20 0053    methylPREDNISolone (MEDROL DOSEPAK) 4 MG TBPK tablet        07/31/20 0053          *Please note:  Annalysa Mohammad was evaluated in Emergency Department on 07/31/2020 for the symptoms described in the history of present illness. She was evaluated in the context of the global COVID-19 pandemic, which necessitated consideration that the patient might be at risk for infection with the  SARS-CoV-2 virus that causes COVID-19. Institutional protocols and algorithms that pertain to the evaluation of patients at risk for COVID-19 are in a state of rapid change based on information released by regulatory bodies including the CDC and federal and state organizations. These policies and algorithms were followed during the patient's care in the ED.  Some ED evaluations and interventions may be delayed as a result of limited staffing during and the pandemic.*   Note:  This document was prepared using Dragon voice recognition software and may include unintentional dictation errors.   Paulette Blanch, MD 07/31/20 [phone]

## 2020-07-31 NOTE — ED Notes (Signed)
Assumed care of pt upon being roomed, reports sore throat since Sunday that "seems to keep going down my throat." Hx of strep in the past. Reports last Tylenol at 1830. AOx4, breathing is regular and unlabored. Managing secretions well at this time

## 2020-07-31 NOTE — ED Triage Notes (Signed)
Pt in with co sore throat and right ear ache since Sunday.

## 2020-07-31 NOTE — Discharge Instructions (Signed)
1.  Start antibiotic as prescribed (Amoxicillin 500mg   3 times daily x7 days). 2.  You may take Magic mouthwash as needed for throat discomfort. 3.  Start steroid taper as prescribed (Medrol Dosepak). 4.  Return to the ER for worsening symptoms, persistent vomiting, difficulty breathing or other concerns

## 2020-08-12 ENCOUNTER — Other Ambulatory Visit: Payer: Self-pay

## 2020-08-12 ENCOUNTER — Encounter: Payer: Self-pay | Admitting: Emergency Medicine

## 2020-08-12 ENCOUNTER — Emergency Department
Admission: EM | Admit: 2020-08-12 | Discharge: 2020-08-12 | Disposition: A | Discharge status: Home / Self Care | Payer: BLUE CROSS/BLUE SHIELD | Attending: Emergency Medicine | Admitting: Emergency Medicine

## 2020-08-12 DIAGNOSIS — J029 Acute pharyngitis, unspecified: Secondary | ICD-10-CM | POA: Diagnosis present

## 2020-08-12 DIAGNOSIS — Z91040 Latex allergy status: Secondary | ICD-10-CM | POA: Insufficient documentation

## 2020-08-12 DIAGNOSIS — A545 Gonococcal pharyngitis: Secondary | ICD-10-CM | POA: Insufficient documentation

## 2020-08-12 LAB — CHLAMYDIA/NGC RT PCR (ARMC ONLY)
Chlamydia Tr: NOT DETECTED
Chlamydia Tr: NOT DETECTED
N gonorrhoeae: DETECTED — AB
N gonorrhoeae: NOT DETECTED

## 2020-08-12 LAB — MONONUCLEOSIS SCREEN: Mono Screen: NEGATIVE

## 2020-08-12 LAB — GROUP A STREP BY PCR: Group A Strep by PCR: NOT DETECTED

## 2020-08-12 MED ORDER — LIDOCAINE HCL (PF) 1 % IJ SOLN
5.0000 mL | Freq: Once | INTRAMUSCULAR | Status: AC
  Administered 2020-08-12: 5 mL via INTRADERMAL
  Filled 2020-08-12: qty 5

## 2020-08-12 MED ORDER — CEFTRIAXONE SODIUM 1 G IJ SOLR
500.0000 mg | Freq: Once | INTRAMUSCULAR | Status: AC
  Administered 2020-08-12: 500 mg via INTRAMUSCULAR
  Filled 2020-08-12: qty 10

## 2020-08-12 MED ORDER — DOXYCYCLINE MONOHYDRATE 100 MG PO TABS: 100.0000 mg | ORAL_TABLET | Freq: Two times a day (BID) | ORAL | 0 refills | Status: DC

## 2020-08-12 NOTE — Discharge Instructions (Addendum)
Follow-up with your regular doctor or [facility] [facility] for HIV and syphilis testing Return emergency department if worsening  Notify all partners.  They will need to be treated

## 2020-08-12 NOTE — ED Provider Notes (Addendum)
[facility] Emergency Department Provider Note  ____________________________________________   None    (approximate)  I have reviewed the triage vital signs and the nursing notes.   HISTORY  Chief Complaint Sore Throat, Generalized Body Aches, and Otalgia    HPI Kaitlin Brown is a 55 y.o. female complains of sore throat and fatigue.  Patient states that symptoms started 2 days ago.  Noticed patches in the back of her throat.  Was recently treated for acute pharyngitis and ear infection with amoxicillin.  Patient states symptoms returned after she finished the medication.  She denies fever /chills.  No cough or congestion   Past Medical History:  Diagnosis Date  . Anemia due to blood loss, chronic    due to menorrhagia  . Back pain   . Bladder infection   . Chicken pox   . Fibroid uterus   . Hypertension   . SVT (supraventricular tachycardia) [facility])     Patient Active Problem List   Diagnosis Date Noted  . Ventricular tachycardia ([place]) 09/03/2019  . Anemia 07/17/2016  . Anemia due to chronic blood loss 07/16/2016  . Acute pulmonary embolism ([place]) 03/17/2016  . DVT (deep venous thrombosis) ([place]) 03/17/2016  . Fibroid uterus   . Anemia due to blood loss, chronic   . Symptomatic anemia 09/28/2015  . Uterine mass 09/28/2015    Past Surgical History:  Procedure Laterality Date  . ABDOMINAL HYSTERECTOMY    . EMBOLIZATION Bilateral 07/18/2016   Procedure: Embolization, uterine arteries;  Surgeon: Algernon Huxley, MD;  Location: [facility];  Service: Cardiovascular;  Laterality: Bilateral;  . HYSTEROSCOPY WITH D & C  10/2014   [facility], Dr Guadalupe Maple  . none      Prior to Admission medications   Medication Sig Start Date End Date Taking? Authorizing Provider  doxycycline (ADOXA) 100 MG tablet Take 1 tablet (100 mg total) by mouth 2 (two) times daily. 08/12/20  Yes Amia Rynders, Linden Dolin, PA-C  amLODipine (NORVASC) 10 MG tablet Take 1 tablet by  mouth daily. 05/08/20   [provider]  DULoxetine (CYMBALTA) 30 MG capsule Take 1 capsule by mouth daily. 05/09/20 05/09/21  [provider]  lisinopril (ZESTRIL) 20 MG tablet Take 20 mg by mouth daily. 09/21/19 10/21/19  [provider]  meloxicam (MOBIC) 7.5 MG tablet Take one/day; max take 2/day if necessary 05/09/20   [provider]  metoprolol tartrate (LOPRESSOR) 50 MG tablet Take 100 mg by mouth 2 (two) times daily. 09/21/19   [provider]  pregabalin (LYRICA) 25 MG capsule Take by mouth. 04/19/20 04/19/21  [provider]  spironolactone (ALDACTONE) 25 MG tablet Take 25 mg by mouth daily. 09/21/19 10/21/19  [provider]    Allergies Latex  Family History  Problem Relation Age of Onset  . Hypertension Mother   . Diabetes Mother   . Glaucoma Mother   . Heart disease Father        Congestive heart condition  . Clotting disorder Maternal Grandmother     Social History Social History   Tobacco Use  . Smoking status: Never Smoker  . Smokeless tobacco: Never Used  Vaping Use  . Vaping Use: Never used  Substance Use Topics  . Alcohol use: Yes    Comment: occasionally  . Drug use: No    Review of Systems  Constitutional: No fever/chills Eyes: No visual changes. ENT: Positive sore throat. Respiratory: Denies cough Cardiovascular: Denies chest pain Gastrointestinal: Denies abdominal  pain Genitourinary: Negative for dysuria. Musculoskeletal: Negative for back pain. Skin: Negative for rash. Psychiatric: no mood changes,     ____________________________________________   PHYSICAL EXAM:  VITAL SIGNS: ED Triage Vitals  Enc Vitals Group     BP 08/12/20 0745 (!) 152/96     Pulse Rate 08/12/20 0745 100     Resp 08/12/20 0745 20     Temp 08/12/20 0745 97.6 F (36.4 C)     Temp Source 08/12/20 0745 Oral     SpO2 08/12/20 0745 95 %     Weight 08/12/20 0742 250 lb (113.4 kg)     Height 08/12/20 0742 5\' 6"  (1.676  m)     Head Circumference --      Peak Flow --      Pain Score 08/12/20 0741 6     Pain Loc --      Pain Edu? --      Excl. in [place]? --     Constitutional: Alert and oriented. Well appearing and in no acute distress. Eyes: Conjunctivae are normal.  Head: Atraumatic. Nose: No congestion/rhinnorhea. Mouth/Throat: Mucous membranes are moist.  Tonsils are reddened with greenish to yellow exudate posteriorly Neck:  supple no lymphadenopathy noted Cardiovascular: Normal rate, regular rhythm. Heart sounds are normal Respiratory: Normal respiratory effort.  No retractions, lungs c t a  GU: deferred Musculoskeletal: FROM all extremities, warm and well perfused Neurologic:  Normal speech and language.  Skin:  Skin is warm, dry and intact. No rash noted. Psychiatric: Mood and affect are normal. Speech and behavior are normal.  ____________________________________________   LABS (all labs ordered are listed, but only abnormal results are displayed)  Labs Reviewed  CHLAMYDIA/NGC RT PCR ([facility] ONLY) - Abnormal; Notable for the following components:      Result Value   N gonorrhoeae DETECTED (*)    All other components within normal limits  GROUP A STREP BY PCR  CHLAMYDIA/NGC RT PCR ([facility] ONLY)  MONONUCLEOSIS SCREEN   ____________________________________________   ____________________________________________  RADIOLOGY    ____________________________________________   PROCEDURES  Procedure(s) performed: No  Procedures    ____________________________________________   INITIAL IMPRESSION / ASSESSMENT AND PLAN / ED COURSE  Pertinent labs & imaging results that were available during my care of the patient were reviewed by me and considered in my medical decision making (see chart for details).   Patient is 55 year old female presents with sore throat.  See HPI.  Physical exam shows patient appears stable  Strep test, monotest, GC/chlamydia test  obtained  Strep test negative, monotest negative, GC/chlamydia test for pharyngitis is positive for gonorrhea Vaginal swab for gonorrhea/chlamydia is negative  I did explain all findings to the patient.  She was given Rocephin 500 mg IM, prescription for doxycycline twice daily for 7 days.  She is to notify all partners.  Return emergency department if worsening.  Follow with health department or regular physician for syphilis and HIV testing.  She is discharged stable condition.  Callee Rohrig was evaluated in Emergency Department on 08/12/2020 for the symptoms described in the history of present illness. She was evaluated in the context of the global COVID-19 pandemic, which necessitated consideration that the patient might be at risk for infection with the SARS-CoV-2 virus that causes COVID-19. Institutional protocols and algorithms that pertain to the evaluation of patients at risk for COVID-19 are in a state of rapid change based on information released by regulatory bodies including the CDC and federal and state organizations. These policies  and algorithms were followed during the patient's care in the ED.    As part of my medical decision making, I reviewed the following data within the [place] notes reviewed and incorporated, Labs reviewed , Old chart reviewed, Notes from prior ED visits and [place] Controlled Substance Database  ____________________________________________   FINAL CLINICAL IMPRESSION(S) / ED DIAGNOSES  Final diagnoses:  Acute gonococcal pharyngitis      NEW MEDICATIONS STARTED DURING THIS VISIT:  Discharge Medication List as of 08/12/2020 10:40 AM    START taking these medications   Details  doxycycline (ADOXA) 100 MG tablet Take 1 tablet (100 mg total) by mouth 2 (two) times daily., Starting Sun 08/12/2020, Normal         Note:  This document was prepared using Dragon voice recognition software and may include unintentional  dictation errors.    Versie Starks, PA-C 08/12/20 1254    Caryn Section Linden Dolin, PA-C 08/12/20 1405    Blake Divine, MD 08/13/20 [phone]

## 2020-08-12 NOTE — ED Notes (Signed)
See triage note  Presents with sore throat and fatigue  States she developed fatigue couple of days ago  Sore throat this am with right ear pain

## 2020-08-12 NOTE — ED Triage Notes (Signed)
Pt reports was treated several weeks ago for a sore throat and ear ache. Pt reports yesterday her sx's came back but more. Pt c/o sore throat with some white patches, right ear pain, pain in her body all over and some chest pain. Pt reports is not coughing anything up but feels tired. Pt reports took a covid test but it was negative so concerned she has strep.

## 2020-08-15 ENCOUNTER — Ambulatory Visit: Payer: Self-pay | Admitting: *Deleted

## 2020-08-15 NOTE — Telephone Encounter (Signed)
Patient is calling to report that she was treated for STI with injection and oral medication. Patient feels that the oral medication is making her have stomach pain. Patient advised that the injection she received may have been enough to treat the [facility] and the oral medication was given as prophylactic for coverage of Ch. Patient advised to contact her PCP for recommendation and retesting to make sure she has cleared the infection. She states she will.  Reason for Disposition . [1] MODERATE pain (e.g., interferes with normal activities) AND [2] pain comes and goes (cramps) AND [3] present > 24 hours  (Exception: pain with Vomiting or Diarrhea - see that Guideline)  Answer Assessment - Initial Assessment Questions 1. LOCATION: "Where does it hurt?"      Midline above navel to lower stomach 2. RADIATION: "Does the pain shoot anywhere else?" (e.g., chest, back)     No radiation 3. ONSET: "When did the pain begin?" (e.g., minutes, hours or days ago)      Early this morning 4. SUDDEN: "Gradual or sudden onset?"     Sudden onset 5. PATTERN "Does the pain come and go, or is it constant?"    - If constant: "Is it getting better, staying the same, or worsening?"      (Note: Constant means the pain never goes away completely; most serious pain is constant and it progresses)     - If intermittent: "How long does it last?" "Do you have pain now?"     (Note: Intermittent means the pain goes away completely between bouts)     Has decreased since this morning 6. SEVERITY: "How bad is the pain?"  (e.g., Scale 1-10; mild, moderate, or severe)   - MILD (1-3): doesn't interfere with normal activities, abdomen soft and not tender to touch    - MODERATE (4-7): interferes with normal activities or awakens from sleep, abdomen tender to touch    - SEVERE (8-10): excruciating pain, doubled over, unable to do any normal activities      moderate 7. RECURRENT SYMPTOM: "Have you ever had this type of stomach pain before?"  If Yes, ask: "When was the last time?" and "What happened that time?"      no 8. CAUSE: "What do you think is causing the stomach pain?"     Patient thinks related to medication 9. RELIEVING/AGGRAVATING FACTORS: "What makes it better or worse?" (e.g., movement, antacids, bowel movement)     Laying down 10. OTHER SYMPTOMS: "Do you have any other symptoms?" (e.g., back pain, diarrhea, fever, urination pain, vomiting)       diarrhea 11. PREGNANCY: "Is there any chance you are pregnant?" "When was your last menstrual period?"       n/a  Protocols used: ABDOMINAL PAIN - [facility]

## 2021-03-09 ENCOUNTER — Other Ambulatory Visit: Payer: Self-pay

## 2021-03-09 ENCOUNTER — Emergency Department: Payer: Self-pay

## 2021-03-09 ENCOUNTER — Emergency Department
Admission: EM | Admit: 2021-03-09 | Discharge: 2021-03-09 | Disposition: A | Discharge status: Home / Self Care | Payer: Self-pay | Attending: Emergency Medicine | Admitting: Emergency Medicine

## 2021-03-09 DIAGNOSIS — I10 Essential (primary) hypertension: Secondary | ICD-10-CM | POA: Insufficient documentation

## 2021-03-09 DIAGNOSIS — Z79899 Other long term (current) drug therapy: Secondary | ICD-10-CM | POA: Insufficient documentation

## 2021-03-09 DIAGNOSIS — Z91040 Latex allergy status: Secondary | ICD-10-CM | POA: Insufficient documentation

## 2021-03-09 DIAGNOSIS — U071 COVID-19: Secondary | ICD-10-CM | POA: Insufficient documentation

## 2021-03-09 LAB — CBC WITH DIFFERENTIAL/PLATELET
Abs Immature Granulocytes: 0.03 10*3/uL (ref 0.00–0.07)
Basophils Absolute: 0 10*3/uL (ref 0.0–0.1)
Basophils Relative: 1 %
Eosinophils Absolute: 0 10*3/uL (ref 0.0–0.5)
Eosinophils Relative: 0 %
HCT: 47.9 % — ABNORMAL HIGH (ref 36.0–46.0)
Hemoglobin: 15.5 g/dL — ABNORMAL HIGH (ref 12.0–15.0)
Immature Granulocytes: 1 %
Lymphocytes Relative: 6 %
Lymphs Abs: 0.4 10*3/uL — ABNORMAL LOW (ref 0.7–4.0)
MCH: 27.6 pg (ref 26.0–34.0)
MCHC: 32.4 g/dL (ref 30.0–36.0)
MCV: 85.4 fL (ref 80.0–100.0)
Monocytes Absolute: 0.9 10*3/uL (ref 0.1–1.0)
Monocytes Relative: 16 %
Neutro Abs: 4.6 10*3/uL (ref 1.7–7.7)
Neutrophils Relative %: 76 %
Platelets: 264 10*3/uL (ref 150–400)
RBC: 5.61 MIL/uL — ABNORMAL HIGH (ref 3.87–5.11)
RDW: 13.4 % (ref 11.5–15.5)
WBC: 5.9 10*3/uL (ref 4.0–10.5)
nRBC: 0 % (ref 0.0–0.2)

## 2021-03-09 LAB — COMPREHENSIVE METABOLIC PANEL
ALT: 44 U/L (ref 0–44)
AST: 38 U/L (ref 15–41)
Albumin: 4 g/dL (ref 3.5–5.0)
Alkaline Phosphatase: 87 U/L (ref 38–126)
Anion gap: 4 — ABNORMAL LOW (ref 5–15)
BUN: 7 mg/dL (ref 6–20)
CO2: 26 mmol/L (ref 22–32)
Calcium: 8.7 mg/dL — ABNORMAL LOW (ref 8.9–10.3)
Chloride: 105 mmol/L (ref 98–111)
Creatinine, Ser: 0.66 mg/dL (ref 0.44–1.00)
GFR, Estimated: >60 mL/min (ref >60)
Glucose, Bld: 110 mg/dL — ABNORMAL HIGH (ref 70–99)
Potassium: 3.8 mmol/L (ref 3.5–5.1)
Sodium: 135 mmol/L (ref 135–145)
Total Bilirubin: 0.8 mg/dL (ref 0.3–1.2)
Total Protein: 8 g/dL (ref 6.5–8.1)

## 2021-03-09 LAB — RESP PANEL BY RT-PCR (FLU A&B, COVID) ARPGX2
Influenza A by PCR: NEGATIVE
Influenza B by PCR: NEGATIVE
SARS Coronavirus 2 by RT PCR: POSITIVE — AB

## 2021-03-09 MED ORDER — IBUPROFEN 400 MG PO TABS
400.0000 mg | ORAL_TABLET | Freq: Once | ORAL | Status: AC | PRN
  Administered 2021-03-09: 07:00:00 400 mg via ORAL

## 2021-03-09 MED ORDER — KETOROLAC TROMETHAMINE 30 MG/ML IJ SOLN
15.0000 mg | Freq: Once | INTRAMUSCULAR | Status: AC
  Administered 2021-03-09: 11:00:00 15 mg via INTRAVENOUS
  Filled 2021-03-09: qty 1

## 2021-03-09 MED ORDER — IOHEXOL 350 MG/ML SOLN
150.0000 mL | Freq: Once | INTRAVENOUS | Status: AC | PRN
  Administered 2021-03-09: 11:00:00 150 mL via INTRAVENOUS
  Filled 2021-03-09: qty 150

## 2021-03-09 MED ORDER — KETOROLAC TROMETHAMINE 10 MG PO TABS: 10.0000 mg | ORAL_TABLET | Freq: Four times a day (QID) | ORAL | 0 refills | Status: DC | PRN

## 2021-03-09 NOTE — ED Notes (Signed)
Dc instructions and scripts reviewed with pt. Pt walked to lobby to wait on bus to transport home.

## 2021-03-09 NOTE — ED Provider Notes (Signed)
[facility] Emergency Department Provider Note ____________________________________________   Event Date/Time   First MD Initiated Contact with Patient 03/09/21 [phone]     (approximate)  I have reviewed the triage vital signs and the nursing notes.   HISTORY  Chief Complaint Generalized Body Aches and Headache  HPI Kaitlin Brown is a 55 y.o. female with history of DVT, PE, and remaining history as listed below presents to the emergency department for treatment and evaluation of headache, cough, and body aches for the past 2 days. She has also experienced right calf pain and now chest pressure for the past week. Leg pain has resolved.     Past Medical History:  Diagnosis Date   Anemia due to blood loss, chronic    due to menorrhagia   Back pain    Bladder infection    Chicken pox    Fibroid uterus    Hypertension    SVT (supraventricular tachycardia) ([facility])     Patient Active Problem List   Diagnosis Date Noted   Ventricular tachycardia 09/03/2019   Anemia 07/17/2016   Anemia due to chronic blood loss 07/16/2016   Acute pulmonary embolism ([place]) 03/17/2016   DVT (deep venous thrombosis) ([place]) 03/17/2016   Fibroid uterus    Anemia due to blood loss, chronic    Symptomatic anemia 09/28/2015   Uterine mass 09/28/2015    Past Surgical History:  Procedure Laterality Date   ABDOMINAL HYSTERECTOMY     EMBOLIZATION Bilateral 07/18/2016   Procedure: Embolization, uterine arteries;  Surgeon: Algernon Huxley, MD;  Location: [facility];  Service: Cardiovascular;  Laterality: Bilateral;   HYSTEROSCOPY WITH D & C  10/2014   [facility], Dr Guadalupe Maple   none      Prior to Admission medications   Medication Sig Start Date End Date Taking? Authorizing Provider  ketorolac (TORADOL) 10 MG tablet Take 1 tablet (10 mg total) by mouth every 6 (six) hours as needed. 03/09/21  Yes Jenniger Figiel B, FNP  amLODipine (NORVASC) 10 MG tablet Take 1 tablet by mouth  daily. 05/08/20   [provider]  doxycycline (ADOXA) 100 MG tablet Take 1 tablet (100 mg total) by mouth 2 (two) times daily. 08/12/20   Fisher, Linden Dolin, PA-C  DULoxetine (CYMBALTA) 30 MG capsule Take 1 capsule by mouth daily. 05/09/20 05/09/21  [provider]  lisinopril (ZESTRIL) 20 MG tablet Take 20 mg by mouth daily. 09/21/19 10/21/19  [provider]  metoprolol tartrate (LOPRESSOR) 50 MG tablet Take 100 mg by mouth 2 (two) times daily. 09/21/19   [provider]  pregabalin (LYRICA) 25 MG capsule Take by mouth. 04/19/20 04/19/21  [provider]  spironolactone (ALDACTONE) 25 MG tablet Take 25 mg by mouth daily. 09/21/19 10/21/19  [provider]    Allergies Latex  Family History  Problem Relation Age of Onset   Hypertension Mother    Diabetes Mother    Glaucoma Mother    Heart disease Father        Congestive heart condition   Clotting disorder Maternal Grandmother     Social History Social History   Tobacco Use   Smoking status: Never   Smokeless tobacco: Never  Vaping Use   Vaping Use: Never used  Substance Use Topics   Alcohol use: Yes    Comment: occasionally   Drug use: No    Review of Systems  Constitutional: Positive for fever and chills. Eyes: No visual changes. ENT: No sore  throat. Cardiovascular: Positive for chest pressure. Respiratory: Denies shortness of breath. Gastrointestinal: No abdominal pain.  No nausea, no vomiting.  No diarrhea.  No constipation. Genitourinary: Negative for dysuria. Musculoskeletal: Negative for back pain. Skin: Negative for rash. Neurological: Positive for headache.  ____________________________________________   PHYSICAL EXAM:  VITAL SIGNS: ED Triage Vitals  Enc Vitals Group     BP 03/09/21 0639 (!) 156/91     Pulse Rate 03/09/21 0639 99     Resp 03/09/21 0639 20     Temp 03/09/21 0639 (!) 101 F (38.3 C)     Temp Source 03/09/21 0639 Oral     SpO2 03/09/21 0639 98 %      Weight 03/09/21 0640 220 lb (99.8 kg)     Height 03/09/21 0640 5\' 6"  (1.676 m)     Head Circumference --      Peak Flow --      Pain Score 03/09/21 0640 10     Pain Loc --      Pain Edu? --      Excl. in [place]? --     Constitutional: Alert and oriented. Well appearing and in no acute distress. Eyes: Conjunctivae are normal. Head: Atraumatic. Nose: No congestion/rhinnorhea. Mouth/Throat: Mucous membranes are moist. Oropharynx erythematous. Neck: No stridor.   Hematological/Lymphatic/Immunilogical: No cervical lymphadenopathy. Cardiovascular: Normal rate, regular rhythm. Grossly normal heart sounds.  Good peripheral circulation. Respiratory: Normal respiratory effort.  No retractions. Lungs CTAB. Gastrointestinal: Soft and nontender. No distention. No abdominal bruits. Genitourinary:  Musculoskeletal: No lower extremity tenderness nor edema.  Negative Homan's sign. Neurologic:  Normal speech and language. No gross focal neurologic deficits are appreciated. No gait instability. Skin:  Skin is warm, dry and intact. No rash noted. Psychiatric: Mood and affect are normal. Speech and behavior are normal.  ____________________________________________   LABS (all labs ordered are listed, but only abnormal results are displayed)  Labs Reviewed  RESP PANEL BY RT-PCR (FLU A&B, COVID) ARPGX2 - Abnormal; Notable for the following components:      Result Value   SARS Coronavirus 2 by RT PCR POSITIVE (*)    All other components within normal limits  CBC WITH DIFFERENTIAL/PLATELET - Abnormal; Notable for the following components:   RBC 5.61 (*)    Hemoglobin 15.5 (*)    HCT 47.9 (*)    Lymphs Abs 0.4 (*)    All other components within normal limits  COMPREHENSIVE METABOLIC PANEL - Abnormal; Notable for the following components:   Glucose, Bld 110 (*)    Calcium 8.7 (*)    Anion gap 4 (*)    All other components within normal limits    ____________________________________________  EKG  ED ECG REPORT I, Casie Sturgeon, FNP-BC personally viewed and interpreted this ECG.   Date: 03/09/2021  EKG Time: 1044  Rate: 91  Rhythm: normal sinus rhythm  Axis: normal  Intervals: Prolonged QT  ST&T Change: no ST elevation  ____________________________________________  RADIOLOGY  ED MD interpretation:    CTA chest   I, Angelamarie Avakian, personally viewed and evaluated these images (plain radiographs) as part of my medical decision making, as well as reviewing the written report by the radiologist.  Official radiology report(s): CT Angio Chest PE W and/or Wo Contrast  Result Date: 03/09/2021 CLINICAL DATA:  Chest pain, shortness of breath EXAM: CT ANGIOGRAPHY CHEST WITH CONTRAST TECHNIQUE: Multidetector CT imaging of the chest was performed using the standard protocol during bolus administration of intravenous contrast. Multiplanar CT image reconstructions and MIPs  were obtained to evaluate the vascular anatomy. CONTRAST:  111mL OMNIPAQUE IOHEXOL 350 MG/ML SOLN COMPARISON:  07/22/2016 FINDINGS: Cardiovascular: There is homogeneous enhancement in thoracic aorta. Contrast density in the pulmonary artery branches is less than optimal. Most of the contrast appears to be in systemic circulation. As far as seen, there are no intraluminal filling defects in the central pulmonary artery branches. There is mild ectasia of main pulmonary artery measuring 3.4 cm. Peripheral segmental and subsegmental pulmonary artery branches could not be adequately evaluated due to less than optimal contrast enhancement. Left vertebral artery appears to be arising from the aortic arch. Mediastinum/Nodes: No new significant lymphadenopathy seen. Lungs/Pleura: There is no focal pulmonary consolidation. There is no pleural effusion or pneumothorax. Lower lung fields are not included in their entirety limiting evaluation. Upper Abdomen: There is fatty infiltration  in the liver. Musculoskeletal: Unremarkable. Review of the MIP images confirms the above findings. IMPRESSION: There is no evidence of central pulmonary artery embolism. Contrast density in the pulmonary artery branches is less than optimal related to timing of imaging sequence. Much of the contrast appears to be in the systemic circulation. Evaluation of peripheral pulmonary artery branches is limited. If there is continued clinical suspicion for pulmonary embolism, short-term follow-up CT pulmonary angiogram and/or bilateral venous Doppler examination may be considered. There is no focal pulmonary consolidation. There is no evidence of thoracic aortic dissection. Fatty liver. Electronically Signed   By: Elmer Picker M.D.   On: 03/09/2021 10:55    ____________________________________________   PROCEDURES  Procedure(s) performed (including Critical Care):  Procedures  ____________________________________________   INITIAL IMPRESSION / ASSESSMENT AND PLAN     55 year old female presenting to the emergency department for treatment and evaluation of COVID symptoms as well as chest pressure.  See HPI for further details. Plan will be to get labs and consider CTA chest to rule out PE.  DIFFERENTIAL DIAGNOSIS  COVID, Influenza, PE  ED COURSE  Covid positive. CTA chest reassuring despite inability for radiologist to completely view branches. Vital signs are reassuring as is her EKG. Headache has significantly improved after toradol. She will be discharged home to follow up with primary care or return to the ER for concerns. Work note provided.     As part of my medical decision making, I reviewed the following data within the [facility] reviewed   ___________________________________________   FINAL CLINICAL IMPRESSION(S) / ED DIAGNOSES  Final diagnoses:  [place]     ED Discharge Orders          Ordered    ketorolac (TORADOL) 10 MG tablet  Every 6 hours  PRN       Note to Pharmacy: Injection given in ER   03/09/21 1107             [facility] was evaluated in Emergency Department on 03/09/2021 for the symptoms described in the history of present illness. She was evaluated in the context of the global COVID-19 pandemic, which necessitated consideration that the patient might be at risk for infection with the SARS-CoV-2 virus that causes COVID-19. Institutional protocols and algorithms that pertain to the evaluation of patients at risk for COVID-19 are in a state of rapid change based on information released by regulatory bodies including the CDC and federal and state organizations. These policies and algorithms were followed during the patient's care in the ED.   Note:  This document was prepared using Dragon voice recognition software and may include unintentional dictation  errors.    Victorino Dike, FNP 03/09/21 1302    Lavonia Drafts, MD 03/09/21 1327

## 2021-03-09 NOTE — ED Triage Notes (Addendum)
Pt states has had body aches, cough, headache since yesterday. Possible fever per pt. Pt denies vomiting or diarrhea but has had nausea. Pt took tylenol pta.

## 2021-05-14 ENCOUNTER — Encounter: Payer: Self-pay | Admitting: *Deleted

## 2021-05-14 ENCOUNTER — Other Ambulatory Visit: Payer: Self-pay

## 2021-05-14 ENCOUNTER — Emergency Department: Payer: Self-pay

## 2021-05-14 ENCOUNTER — Emergency Department
Admission: EM | Admit: 2021-05-14 | Discharge: 2021-05-14 | Disposition: A | Discharge status: Home / Self Care | Payer: Self-pay | Attending: Emergency Medicine | Admitting: Emergency Medicine

## 2021-05-14 DIAGNOSIS — Z8616 Personal history of COVID-19: Secondary | ICD-10-CM | POA: Insufficient documentation

## 2021-05-14 DIAGNOSIS — I10 Essential (primary) hypertension: Secondary | ICD-10-CM | POA: Insufficient documentation

## 2021-05-14 DIAGNOSIS — R0602 Shortness of breath: Secondary | ICD-10-CM

## 2021-05-14 LAB — BASIC METABOLIC PANEL
Anion gap: 8 (ref 5–15)
BUN: 7 mg/dL (ref 6–20)
CO2: 30 mmol/L (ref 22–32)
Calcium: 9.5 mg/dL (ref 8.9–10.3)
Chloride: 102 mmol/L (ref 98–111)
Creatinine, Ser: 0.74 mg/dL (ref 0.44–1.00)
GFR, Estimated: >60 mL/min (ref >60)
Glucose, Bld: 119 mg/dL — ABNORMAL HIGH (ref 70–99)
Potassium: 3.9 mmol/L (ref 3.5–5.1)
Sodium: 140 mmol/L (ref 135–145)

## 2021-05-14 LAB — CBC
HCT: 50.5 % — ABNORMAL HIGH (ref 36.0–46.0)
Hemoglobin: 16 g/dL — ABNORMAL HIGH (ref 12.0–15.0)
MCH: 27.1 pg (ref 26.0–34.0)
MCHC: 31.7 g/dL (ref 30.0–36.0)
MCV: 85.6 fL (ref 80.0–100.0)
Platelets: 343 10*3/uL (ref 150–400)
RBC: 5.9 MIL/uL — ABNORMAL HIGH (ref 3.87–5.11)
RDW: 13.3 % (ref 11.5–15.5)
WBC: 4 10*3/uL (ref 4.0–10.5)
nRBC: 0 % (ref 0.0–0.2)

## 2021-05-14 LAB — ETHANOL: Alcohol, Ethyl (B): <10 mg/dL (ref <10)

## 2021-05-14 LAB — TROPONIN I (HIGH SENSITIVITY): Troponin I (High Sensitivity): 3 ng/L (ref <18)

## 2021-05-14 NOTE — ED Provider Triage Note (Signed)
Emergency Medicine Provider Triage Evaluation Note  Blake Goya , a 56 y.o. female  was evaluated in triage.  Pt complains of some shortness of breath but thinks it might be anxiety.  Is been out of her blood pressure medication for 2 weeks.  Took 1 lisinopril prior to arrival..  Review of Systems  Positive: Elevated blood pressure, shortness of breath Negative: Chest pain  Physical Exam  BP (!) 197/117 (BP Location: Left Arm)    Pulse 85    Temp 98.1 F (36.7 C) (Oral)    Resp 20    Ht 5\' 6"  (1.676 m)    Wt 99.8 kg    LMP 03/23/2017    SpO2 97%    BMI 35.51 kg/m  Gen:   Awake, no distress   Resp:  Normal effort  MSK:   Moves extremities without difficulty  Other:    Medical Decision Making  Medically screening exam initiated at 4:09 PM.  Appropriate orders placed.  [facility] was informed that the remainder of the evaluation will be completed by another provider, this initial triage assessment does not replace that evaluation, and the importance of remaining in the ED until their evaluation is complete.     Versie Starks, PA-C 05/14/21 1610

## 2021-05-14 NOTE — ED Notes (Signed)
Pt denies CP; denies CP when she had "SOB" earlier today; denies HA; reports hasn't been able to take BP meds in 2 weeks; states had meds refilled today and took her lisinopril and metoprolol before coming to hospital; pt in NAD; resp reg/unlabored; skin dry; laying calmly on stretcher. Pt reports "it could be anxiety".

## 2021-05-14 NOTE — ED Provider Notes (Signed)
[facility] Provider Note    Event Date/Time   First MD Initiated Contact with Patient 05/14/21 1819     (approximate)   History   CC: High blood pressure  HPI  Kaitlin Brown is a 56 y.o. female  who, per outside hospital note dated 03/12/2021 had recent diagnosis of covid, presents to the emergency department today because of primary concern for high blood pressure.  The patient states that she has been out of most of her medication over the past 2 weeks.  She has had some of her metoprolol which she has taken since then.  Today however the patient started developing some soreness in her chest and shortness of breath.  When she checked her blood pressure she noticed it to be elevated.  She states when this occurred she started having worsening symptoms and that her blood pressure continued to elevate.  It then gradually started to improve and at the time my exam the patient states she no longer is having any symptoms.  Physical Exam   Triage Vital Signs: ED Triage Vitals [05/14/21 1608]  Enc Vitals Group     BP (!) 197/117     Pulse Rate 85     Resp 20     Temp 98.1 F (36.7 C)     Temp Source Oral     SpO2 97 %     Weight 220 lb (99.8 kg)     Height 5\' 6"  (1.676 m)     Head Circumference      Peak Flow      Pain Score 0     Pain Loc      Pain Edu?      Excl. in [place]?     Most recent vital signs: Vitals:   05/14/21 1608  BP: (!) 197/117  Pulse: 85  Resp: 20  Temp: 98.1 F (36.7 C)  SpO2: 97%   General: Awake, no distress.  CV:  Good peripheral perfusion. Regular rate and rhythm. No murmurs. Resp:  Normal effort. Lungs clear to auscultation Abd:  No distention.    ED Results / Procedures / Treatments   Labs (all labs ordered are listed, but only abnormal results are displayed) Labs Reviewed  BASIC METABOLIC PANEL - Abnormal; Notable for the following components:      Result Value   Glucose, Bld 119 (*)    All other  components within normal limits  CBC - Abnormal; Notable for the following components:   RBC 5.90 (*)    Hemoglobin 16.0 (*)    HCT 50.5 (*)    All other components within normal limits  ETHANOL  TROPONIN I (HIGH SENSITIVITY)     EKG  I, Nance Pear, attending physician, personally viewed and interpreted this EKG  EKG Time: 1611 Rate: 77 Rhythm: normal sinus rhythm Axis: normal Intervals: qtc 443 QRS: narrow, q waves v1 ST changes: no st elevation Impression: abnormal ekg  RADIOLOGY CXR I independently interpreted and visualized the CXR. My interpretation: No pneumothorax or pneumonia. Radiology interpretation:  IMPRESSION:  No active cardiopulmonary disease.      PROCEDURES:  Critical Care performed: No  Procedures   MEDICATIONS ORDERED IN ED: Medications - No data to display   IMPRESSION / MDM / [place] / ED COURSE  I reviewed the triage vital signs and the nursing notes.  Differential diagnosis includes, but is not limited to, high blood pressure citing medication noncompliance.  ACS, pneumonia.  Patient presented to the emergency department today with primary concern for high blood pressure.  He did improve while she was here in the emergency department.  I did discuss with the patient that it could be secondary to medication noncompliance as well as some anxiety which patient is concerned about.  Terms of the shortness of breath chest x-ray without any concerning pneumonia or pneumothorax.  At this point I have very low concern for PE.  Low concern for ACS given negative troponin and EKG without concerning acute abnormalities. Unclear to myself why the ethanol level was ordered from triage, however this was also negative. Given improvement of patient's symptoms and reassuring work up do not feel patient necessitates admission at this time. She states she will be able to get the rest of her prescriptions in three  days.    FINAL CLINICAL IMPRESSION(S) / ED DIAGNOSES   Final diagnoses:  SOB (shortness of breath)  Hypertension, unspecified type    Note:  This document was prepared using Dragon voice recognition software and may include unintentional dictation errors.    Nance Pear, MD 05/14/21 [phone]

## 2021-05-14 NOTE — Discharge Instructions (Signed)
Please seek medical attention for any high fevers, chest pain, shortness of breath, change in behavior, persistent vomiting, bloody stool or any other new or concerning symptoms.  

## 2021-05-14 NOTE — ED Triage Notes (Signed)
Pt reports sob.  Pt out of meds x 2 weeks.  Pt has soreness in chest.  No cough.  Nonsmoker.  Pt alert  speech clear.

## 2021-05-15 ENCOUNTER — Emergency Department
Admission: EM | Admit: 2021-05-15 | Discharge: 2021-05-16 | Disposition: A | Discharge status: Home / Self Care | Payer: Self-pay | Attending: Emergency Medicine | Admitting: Emergency Medicine

## 2021-05-15 ENCOUNTER — Other Ambulatory Visit: Payer: Self-pay

## 2021-05-15 ENCOUNTER — Encounter: Payer: Self-pay | Admitting: *Deleted

## 2021-05-15 ENCOUNTER — Ambulatory Visit: Payer: Self-pay | Admitting: *Deleted

## 2021-05-15 DIAGNOSIS — M79662 Pain in left lower leg: Secondary | ICD-10-CM | POA: Insufficient documentation

## 2021-05-15 DIAGNOSIS — Z79899 Other long term (current) drug therapy: Secondary | ICD-10-CM | POA: Insufficient documentation

## 2021-05-15 DIAGNOSIS — I10 Essential (primary) hypertension: Secondary | ICD-10-CM | POA: Insufficient documentation

## 2021-05-15 DIAGNOSIS — R0789 Other chest pain: Secondary | ICD-10-CM | POA: Insufficient documentation

## 2021-05-15 DIAGNOSIS — R0602 Shortness of breath: Secondary | ICD-10-CM | POA: Insufficient documentation

## 2021-05-15 DIAGNOSIS — M79661 Pain in right lower leg: Secondary | ICD-10-CM | POA: Insufficient documentation

## 2021-05-15 LAB — BASIC METABOLIC PANEL
Anion gap: 10 (ref 5–15)
BUN: 9 mg/dL (ref 6–20)
CO2: 28 mmol/L (ref 22–32)
Calcium: 9.4 mg/dL (ref 8.9–10.3)
Chloride: 102 mmol/L (ref 98–111)
Creatinine, Ser: 0.82 mg/dL (ref 0.44–1.00)
GFR, Estimated: >60 mL/min (ref >60)
Glucose, Bld: 93 mg/dL (ref 70–99)
Potassium: 4 mmol/L (ref 3.5–5.1)
Sodium: 140 mmol/L (ref 135–145)

## 2021-05-15 LAB — CBC
HCT: 49.3 % — ABNORMAL HIGH (ref 36.0–46.0)
Hemoglobin: 15.5 g/dL — ABNORMAL HIGH (ref 12.0–15.0)
MCH: 27.5 pg (ref 26.0–34.0)
MCHC: 31.4 g/dL (ref 30.0–36.0)
MCV: 87.4 fL (ref 80.0–100.0)
Platelets: 311 10*3/uL (ref 150–400)
RBC: 5.64 MIL/uL — ABNORMAL HIGH (ref 3.87–5.11)
RDW: 13.8 % (ref 11.5–15.5)
WBC: 4.6 10*3/uL (ref 4.0–10.5)
nRBC: 0 % (ref 0.0–0.2)

## 2021-05-15 LAB — TROPONIN I (HIGH SENSITIVITY)
Troponin I (High Sensitivity): 3 ng/L (ref <18)
Troponin I (High Sensitivity): <2 ng/L (ref <18)

## 2021-05-15 NOTE — Telephone Encounter (Signed)
?  Chief Complaint: SOB with exertion ?Symptoms: Patient reports she was seen at ED yesterday and released. She had SOB and elevated BP. Her BP is back down- 129/84 P 77 today with medications. Patient states she has SOB with exertion and it is no better. Due to patient history- advised ED or PCP follow up- she has not contacted PCP ?Frequency: started yesterday- no better ?Pertinent Negatives: Patient denies dizziness, runny nose, cough, chest pain, fever ?Disposition: [x] ED /[] Urgent Care (no appt availability in office) / [] Appointment(In office/virtual)/ []  [facility]/ [] Home Care/ [] Refused Recommended Disposition /[] [place] [facility]/ []  Follow-up with PCP ?Additional Notes: Advised ED- follow up with PCP  ?

## 2021-05-15 NOTE — ED Triage Notes (Signed)
Pt ambulatory to triage.  Pt has sob and a headache.  Pt was seen in ER yesterday with similar sx.  Hx anxiety. Pt reports soreness in chest.  Pt alert  speech clear.  ?

## 2021-05-15 NOTE — Telephone Encounter (Signed)
Reason for Disposition ?? History of prior "blood clot" in leg or lungs (i.e., deep vein thrombosis, pulmonary embolism) ? ?Answer Assessment - Initial Assessment Questions ?1. RESPIRATORY STATUS: "Describe your breathing?" (e.g., wheezing, shortness of breath, unable to speak, severe coughing)  ?    SOB with exertion- no better ?2. ONSET: "When did this breathing problem begin?"  ?    yesterday ?3. PATTERN "Does the difficult breathing come and go, or has it been constant since it started?"  ?    Comes and goes- with exertion ?4. SEVERITY: "How bad is your breathing?" (e.g., mild, moderate, severe)  ?  - MILD: No SOB at rest, mild SOB with walking, speaks normally in sentences, can lie down, no retractions, pulse < 100.  ?  - MODERATE: SOB at rest, SOB with minimal exertion and prefers to sit, cannot lie down flat, speaks in phrases, mild retractions, audible wheezing, pulse 100-120.  ?  - SEVERE: Very SOB at rest, speaks in single words, struggling to breathe, sitting hunched forward, retractions, pulse > 120  ?    Mild ?5. RECURRENT SYMPTOM: "Have you had difficulty breathing before?" If Yes, ask: "When was the last time?" and "What happened that time?"  ?    Yes- DVT/PE ?6. CARDIAC HISTORY: "Do you have any history of heart disease?" (e.g., heart attack, angina, bypass surgery, angioplasty)  ?    High blood pressure, tachycardia  ?7. LUNG HISTORY: "Do you have any history of lung disease?"  (e.g., pulmonary embolus, asthma, emphysema) ?    Pulmonary embolism  ?8. CAUSE: "What do you think is causing the breathing problem?"  ?    Not sure- patient is afraid she has clot and ED exam missed it ?9. OTHER SYMPTOMS: "Do you have any other symptoms? (e.g., dizziness, runny nose, cough, chest pain, fever) ?    no ?10. O2 SATURATION MONITOR:  "Do you use an oxygen saturation monitor (pulse oximeter) at home?" If Yes, "What is your reading (oxygen level) today?" "What is your usual oxygen saturation reading?" (e.g.,  95%) ?      *No Answer* ?11. PREGNANCY: "Is there any chance you are pregnant?" "When was your last menstrual period?" ?      *No Answer* ?12. TRAVEL: "Have you traveled out of the country in the last month?" (e.g., travel history, exposures) ?      *No Answer* ? ?Protocols used: Breathing Difficulty-A-AH ? ?

## 2021-05-16 ENCOUNTER — Emergency Department: Payer: Self-pay

## 2021-05-16 ENCOUNTER — Other Ambulatory Visit: Payer: Self-pay

## 2021-05-16 ENCOUNTER — Encounter: Payer: Self-pay | Admitting: Radiology

## 2021-05-16 MED ORDER — IOHEXOL 350 MG/ML SOLN
100.0000 mL | Freq: Once | INTRAVENOUS | Status: AC | PRN
Start: 2021-05-16 — End: 2021-05-16
  Administered 2021-05-16: 100 mL via INTRAVENOUS

## 2021-05-16 NOTE — Discharge Instructions (Addendum)
You may alternate Tylenol 1000 mg every 6 hours as needed for pain, fever and Ibuprofen 800 mg every 6-8 hours as needed for pain, fever.  Please take Ibuprofen with food.  Do not take more than 4000 mg of Tylenol (acetaminophen) in a 24 hour period. ? ? ?You work-up has been reassuring.  You had normal cardiac labs yesterday as well as today.  CTA of your chest and ultrasound of your leg showed no blood clots.  Your blood pressure has improved and is in the 130s/80s here.  I recommend close follow-up with your primary care doctor as well as your cardiologist. ?

## 2021-05-16 NOTE — ED Notes (Signed)
Pt given 2 warm blankets and heat turned up in room.  Pt denies any needs at this time.  Pt resting in stretcher with respirations even and unlabored.   ?

## 2021-05-16 NOTE — ED Provider Notes (Signed)
[facility] Provider Note    Event Date/Time   First MD Initiated Contact with Patient 05/15/21 2303     (approximate)   History   Shortness of Breath   HPI  Kaitlin Brown is a 56 y.o. 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  She states she was seen here in the emergency department yesterday for similar symptoms and had a reassuring work-up.  She states at home her blood pressure was high but this came down on its own.  She states she did have a headache yesterday and today but this is also resolved.  No vision changes, numbness, tingling or weakness.  She states she became concerned because this felt similar to when she had a previous PE.  She states she has had ongoing pain in both of her calves and knees for weeks but thought this was due to her strenuous job.  No leg swelling.  She states when she had her blood clots before it was after an injury to her right leg and she developed a DVT.  Denies any recent injury, exogenous estrogen use, tobacco use, prolonged immobilization such as long flight, hospitalization, surgery.  She denies any fevers or cough.   History provided by patient.    Past Medical History:  Diagnosis Date   Anemia due to blood loss, chronic    due to menorrhagia   Back pain    Bladder infection    Chicken pox    Fibroid uterus    Hypertension    SVT (supraventricular tachycardia) (HCC)     Past Surgical History:  Procedure Laterality Date   ABDOMINAL HYSTERECTOMY     EMBOLIZATION Bilateral 07/18/2016   Procedure: Embolization, uterine arteries;  Surgeon: Algernon Huxley, MD;  Location: [facility];  Service: Cardiovascular;  Laterality: Bilateral;   HYSTEROSCOPY WITH D & C  10/2014   [facility], Dr  Guadalupe Maple   none      MEDICATIONS:  Prior to Admission medications   Medication Sig Start Date End Date Taking? Authorizing Provider  amLODipine (NORVASC) 10 MG tablet Take 1 tablet by mouth daily. 05/08/20   [provider]  doxycycline (ADOXA) 100 MG tablet Take 1 tablet (100 mg total) by mouth 2 (two) times daily. 08/12/20   Fisher, Linden Dolin, PA-C  DULoxetine (CYMBALTA) 30 MG capsule Take 1 capsule by mouth daily. 05/09/20 05/09/21  [provider]  ketorolac (TORADOL) 10 MG tablet Take 1 tablet (10 mg total) by mouth every 6 (six) hours as needed. 03/09/21   Triplett, Cari B, FNP  lisinopril (ZESTRIL) 20 MG tablet Take 20 mg by mouth daily. 09/21/19 10/21/19  [provider]  metoprolol tartrate (LOPRESSOR) 50 MG tablet Take 100 mg by mouth 2 (two) times daily. 09/21/19   [provider]  pregabalin (LYRICA) 25 MG capsule Take by mouth. 04/19/20 04/19/21  [provider]  spironolactone (ALDACTONE) 25 MG tablet Take 25 mg by mouth daily. 09/21/19 10/21/19  [provider]    Physical Exam   Triage Vital Signs: ED Triage Vitals  Enc Vitals Group     BP 05/15/21 2026 (!) 140/91     Pulse Rate 05/15/21 2026 71     Resp 05/15/21 2026 18     Temp 05/15/21 2026 98.4 F (  36.9 C)     Temp Source 05/15/21 2026 Oral     SpO2 05/15/21 2026 96 %     Weight 05/15/21 2027 225 lb (102.1 kg)     Height 05/15/21 2027 5\' 6"  (1.676 m)     Head Circumference --      Peak Flow --      Pain Score 05/15/21 2027 0     Pain Loc --      Pain Edu? --      Excl. in [place]? --     Most recent vital signs: Vitals:   05/16/21 0030 05/16/21 0245  BP: (!) 141/89 136/83  Pulse: 65 75  Resp: 16 18  Temp:    SpO2: 98% 94%    CONSTITUTIONAL: Alert and oriented and responds appropriately to questions. Well-appearing; well-nourished HEAD: Normocephalic, atraumatic EYES: Conjunctivae clear, pupils appear equal, sclera nonicteric ENT: normal nose; moist mucous  membranes NECK: Supple, normal ROM CARD: RRR; S1 and S2 appreciated; no murmurs, no clicks, no rubs, no gallops RESP: Normal chest excursion without splinting or tachypnea; breath sounds clear and equal bilaterally; no wheezes, no rhonchi, no rales, no hypoxia or respiratory distress, speaking full sentences ABD/GI: Normal bowel sounds; non-distended; soft, non-tender, no rebound, no guarding, no peritoneal signs BACK: The back appears normal EXT: Normal ROM in all joints; no deformity noted, no edema; no cyanosis, no calf tenderness or calf swelling SKIN: Normal color for age and race; warm; no rash on exposed skin NEURO: Moves all extremities equally, normal speech PSYCH: [name] mood and manner are appropriate.   ED Results / Procedures / Treatments   LABS: (all labs ordered are listed, but only abnormal results are displayed) Labs Reviewed  CBC - Abnormal; Notable for the following components:      Result Value   RBC 5.64 (*)    Hemoglobin 15.5 (*)    HCT 49.3 (*)    All other components within normal limits  BASIC METABOLIC PANEL  TROPONIN I (HIGH SENSITIVITY)  TROPONIN I (HIGH SENSITIVITY)     EKG:    Date: 05/15/2021 20:27  Rate: 72  Rhythm: normal sinus rhythm  QRS Axis: normal  Intervals: normal  ST/T Wave abnormalities: T wave inversions in lateral leads  Conduction Disutrbances: none  Narrative Interpretation: T wave inversions in lateral leads that were present on EKG and December 2022       RADIOLOGY: My personal review and interpretation of imaging: CT scan shows no PE, pneumonia, pneumothorax, edema.  I have personally reviewed all radiology reports.   CT Angio Chest PE W and/or Wo Contrast  Result Date: 05/16/2021 CLINICAL DATA:  Concern for pulmonary embolism. EXAM: CT ANGIOGRAPHY CHEST WITH CONTRAST TECHNIQUE: Multidetector CT imaging of the chest was performed using the standard protocol during bolus administration of intravenous contrast.  Multiplanar CT image reconstructions and MIPs were obtained to evaluate the vascular anatomy. RADIATION DOSE REDUCTION: This exam was performed according to the departmental dose-optimization program which includes automated exposure control, adjustment of the mA and/or kV according to patient size and/or use of iterative reconstruction technique. CONTRAST:  16mL OMNIPAQUE IOHEXOL 350 MG/ML SOLN COMPARISON:  Chest CT dated 03/09/2021. FINDINGS: Cardiovascular: There is no cardiomegaly or pericardial effusion. The thoracic aorta is unremarkable. The origins of the great vessels of the aortic arch appear patent. Evaluation of the pulmonary arteries is limited due to respiratory motion artifact. No pulmonary artery embolus identified. Mediastinum/Nodes: There is no hilar or mediastinal adenopathy. The esophagus and  the thyroid gland are grossly unremarkable. No mediastinal fluid collection. Lungs/Pleura: No focal consolidation, pleural effusion, or pneumothorax. The central airways are patent. Upper Abdomen: Fatty liver. Indeterminate 3.5 cm lesion in the right lobe of the liver with peripheral nodular enhancement most consistent with a hemangioma. This was present on the prior studies dating back to 2017 but has increased in size since 2017. Musculoskeletal: Mild degenerative changes of the spine. No acute osseous pathology. Review of the MIP images confirms the above findings. IMPRESSION: 1. No acute intrathoracic pathology. No CT evidence of pulmonary artery embolus. 2. Fatty liver. Electronically Signed   By: Anner Crete M.D.   On: 05/16/2021 00:30   US Venous Img Lower Bilateral  Result Date: 05/16/2021 CLINICAL DATA:  Bilateral leg pain EXAM: BILATERAL LOWER EXTREMITY VENOUS DOPPLER ULTRASOUND TECHNIQUE: Gray-scale sonography with compression, as well as color and duplex ultrasound, were performed to evaluate the deep venous system(s) from the level of the common femoral vein through the popliteal and  proximal calf veins. COMPARISON:  None. FINDINGS: VENOUS Normal compressibility of the common femoral, superficial femoral, and popliteal veins, as well as the visualized calf veins. Visualized portions of profunda femoral vein and great saphenous vein unremarkable. No filling defects to suggest DVT on grayscale or color Doppler imaging. Doppler waveforms show normal direction of venous flow, normal respiratory plasticity and response to augmentation. OTHER None. Limitations: none IMPRESSION: Negative. Electronically Signed   By: Fidela Salisbury M.D.   On: 05/16/2021 04:04     PROCEDURES:  Critical Care performed: No   CRITICAL CARE Performed by: Cyril Mourning Imajean Mcdermid   Total critical care time: 0 minutes  Critical care time was exclusive of separately billable procedures and treating other patients.  Critical care was necessary to treat or prevent imminent or life-threatening deterioration.  Critical care was time spent personally by me on the following activities: development of treatment plan with patient and/or surrogate as well as nursing, discussions with consultants, evaluation of patient's response to treatment, examination of patient, obtaining history from patient or surrogate, ordering and performing treatments and interventions, ordering and review of laboratory studies, ordering and review of radiographic studies, pulse oximetry and re-evaluation of patient's condition.   Marland Kitchen1-3 Lead EKG Interpretation Performed by: Riaz Onorato, Delice Bison, DO Authorized by: Melana Hingle, Delice Bison, DO     Interpretation: normal     ECG rate:  70   ECG rate assessment: normal     Rhythm: sinus rhythm     Ectopy: none     Conduction: normal      IMPRESSION / MDM / ASSESSMENT AND PLAN / ED COURSE  I reviewed the triage vital signs and the nursing notes.    Patient here with atypical chest pain, shortness of breath, headaches, bilateral lower extremity pain.  Blood pressure currently in the 140s/80s.  The  patient is on the cardiac monitor to evaluate for evidence of arrhythmia and/or significant heart rate changes.   DIFFERENTIAL DIAGNOSIS (includes but not limited to):   ACS, PE, dissection, musculoskeletal pain, pneumonia, pneumothorax, CHF   PLAN: We will obtain cardiac labs including troponin x2, CBC, BMP, CTA of the chest.  EKG shows no new ischemic change compared to previous.  No pain medicine needed at this time given she is currently asymptomatic.   MEDICATIONS GIVEN IN ED: Medications  iohexol (OMNIPAQUE) 350 MG/ML injection 100 mL (100 mLs Intravenous Contrast Given 05/16/21 0016)     ED COURSE: Patient's work-up today has been very reassuring.  No  anemia, electrolyte derangement.  Troponin x2 negative.  CTA of the chest reviewed by myself and radiologist and shows no PE, infiltrate, edema.  Venous Dopplers were reviewed by myself and radiologist and showed no DVT.  Blood pressure currently 130s/80s.  She is asymptomatic.  Recommended close follow-up with her primary care doctor.   ,At this time, I do not feel there is any life-threatening condition present. I reviewed all nursing notes, vitals, pertinent previous records.  All lab and urine results, EKGs, imaging ordered have been independently reviewed and interpreted by myself.  I reviewed all available radiology reports from any imaging ordered this visit.  Based on my assessment, I feel the patient is safe to be discharged home without further emergent workup and can continue workup as an outpatient as needed. Discussed all findings, treatment plan as well as usual and customary return precautions with patient.  They verbalize understanding and are comfortable with this plan.  Outpatient follow-up has been provided as needed.  All questions have been answered.   CONSULTS: Admission considered given patient does have risk factors for ACS (hypertension, BMI of 36) however work-up today and yesterday have been reassuring and her  symptoms have currently resolved.   OUTSIDE RECORDS REVIEWED: Reviewed patient's most recent internal medicine note with Nadyne Coombes PA on 03/12/2021.  Also appears patient has been followed by Dr. Hunt Oris with cardiology.         FINAL CLINICAL IMPRESSION(S) / ED DIAGNOSES   Final diagnoses:  Atypical chest pain     Rx / DC Orders   ED Discharge Orders     None        Note:  This document was prepared using Dragon voice recognition software and may include unintentional dictation errors.   Karis Emig, Delice Bison, DO 05/16/21 [phone]

## 2021-05-16 NOTE — ED Notes (Signed)
Pt provided with cup of ginger ale and graham crackers at request.   ?

## 2021-06-12 ENCOUNTER — Emergency Department: Payer: Self-pay

## 2021-06-12 ENCOUNTER — Other Ambulatory Visit: Payer: Self-pay

## 2021-06-12 ENCOUNTER — Emergency Department
Admission: EM | Admit: 2021-06-12 | Discharge: 2021-06-12 | Disposition: A | Discharge status: Home / Self Care | Payer: Self-pay | Attending: Emergency Medicine | Admitting: Emergency Medicine

## 2021-06-12 DIAGNOSIS — M25561 Pain in right knee: Secondary | ICD-10-CM | POA: Insufficient documentation

## 2021-06-12 DIAGNOSIS — X501XXA Overexertion from prolonged static or awkward postures, initial encounter: Secondary | ICD-10-CM | POA: Insufficient documentation

## 2021-06-12 DIAGNOSIS — Y93K1 Activity, walking an animal: Secondary | ICD-10-CM | POA: Insufficient documentation

## 2021-06-12 DIAGNOSIS — I10 Essential (primary) hypertension: Secondary | ICD-10-CM | POA: Insufficient documentation

## 2021-06-12 MED ORDER — IBUPROFEN 400 MG PO TABS
400.0000 mg | ORAL_TABLET | Freq: Once | ORAL | Status: AC
  Administered 2021-06-12: 400 mg via ORAL
  Filled 2021-06-12: qty 1

## 2021-06-12 MED ORDER — ACETAMINOPHEN 500 MG PO TABS
1000.0000 mg | ORAL_TABLET | Freq: Once | ORAL | Status: AC
  Administered 2021-06-12: 1000 mg via ORAL
  Filled 2021-06-12: qty 2

## 2021-06-12 NOTE — ED Notes (Signed)
No deformity noted. No crepitus. No numbness or tingling to toes. ?

## 2021-06-12 NOTE — ED Provider Notes (Signed)
? ?[facility] ?Provider Note ? ? ? Event Date/Time  ? First MD Initiated Contact with Patient 06/12/21 1026   ?  (approximate) ? ? ?History  ? ?Knee Injury ? ? ?HPI ? ?Kaitlin Brown is a 56 y.o. female with a past medical history of hypertension, remote DVT, osteoarthritis of the right knee having received steroid injections recently by orthopedist at [facility] who presents for evaluation of acute right knee pain that she states began last night when she was walking her dog when her dog suddenly ran towards the right causing her to twist the right knee.  No dislocation that spontaneously reduced or any other acute pain including her right ankle, hip, left lower extremity or anywhere else.  She did not actually fall or hit her head.  No other recent sick symptoms such as fevers, chills, cough, nausea, vomiting, diarrhea, rash or any other acute concerns.  She has been able to bear weight although with significant pain.  No analgesia prior to arrival. ? ?  ? ? ?Physical Exam  ?Triage Vital Signs: ?ED Triage Vitals [06/12/21 1006]  ?Enc Vitals Group  ?   BP   ?   Pulse   ?   Resp   ?   Temp   ?   Temp src   ?   SpO2   ?   Weight   ?   Height   ?   Head Circumference   ?   Peak Flow   ?   Pain Score 10  ?   Pain Loc   ?   Pain Edu?   ?   Excl. in [place]?   ? ? ?Most recent vital signs: ?Vitals:  ? 06/12/21 1036  ?BP: (!) 130/95  ?Pulse: 75  ?Resp: 20  ?Temp: 97.7 ?F (36.5 ?C)  ?SpO2: 97%  ? ? ?General: Awake, no distress.  ?CV:  Good peripheral perfusion.  ?Resp:  Normal effort.  ?Abd:  No distention.  ?Other:  Patient is able to flex and extend the right hip and ankle symmetrically compared to the left.  2+ bilateral PT pulses.  There is some tenderness over the lateral aspect of the right knee.  No deformity or large effusion.  No tenderness of the anterior, medial or posterior aspects.  Negative lever test. ? ? ?ED Results / Procedures / Treatments  ?Labs ?(all labs ordered are listed, but  only abnormal results are displayed) ?Labs Reviewed - No data to display ? ? ?EKG ? ? ? ?RADIOLOGY ?Plain film of the right knee interpreted by myself without evidence of fracture or dislocation.  I also reviewed radiology's findings and agree with their interpretation of same.  I agree with notation of some osteoarthritis. ? ? ?PROCEDURES: ? ?Critical Care performed: No ? ?Procedures ? ? ? ?MEDICATIONS ORDERED IN ED: ?Medications  ?acetaminophen (TYLENOL) tablet 1,000 mg (has no administration in time range)  ?ibuprofen (ADVIL) tablet 400 mg (has no administration in time range)  ? ? ? ?IMPRESSION / MDM / ASSESSMENT AND PLAN / ED COURSE  ?I reviewed the triage vital signs and the nursing notes. ?             ?               ? ?Differential diagnosis includes, but is not limited to likely lateral collateral ligament injury versus meniscus injury versus contusion or muscle strain.  X-ray without evidence of fracture or dislocation.  Patient is otherwise  neurovascularly intact distally with soft compartments, very low suspicion for compartment syndrome or other immediate life-threatening process.  Low suspicion for DVT or infectious process at this time.  Will place in immobilizer and have patient follow-up with her orthopedist.  Advised weightbearing as tolerated and that she may use Tylenol ibuprofen as needed for pain.  Discharged in stable condition.  Strict return precautions advised and discussed. ? ?  ? ? ?FINAL CLINICAL IMPRESSION(S) / ED DIAGNOSES  ? ?Final diagnoses:  ?Acute pain of right knee  ? ? ? ?Rx / DC Orders  ? ?ED Discharge Orders   ? ? None  ? ?  ? ? ? ?Note:  This document was prepared using Dragon voice recognition software and may include unintentional dictation errors. ?  ?Lucrezia Starch, MD ?06/12/21 1304 ? ?

## 2021-06-12 NOTE — ED Triage Notes (Signed)
Pt comes with c/o right knee pain. Pt states she took her dog out last night and he jerked her around and she injured her knee. Pt states pain 10/10 and worst when trying to put pressure on it. ?

## 2021-06-16 ENCOUNTER — Other Ambulatory Visit: Payer: Self-pay

## 2021-06-16 ENCOUNTER — Encounter: Payer: Self-pay | Admitting: Emergency Medicine

## 2021-06-16 ENCOUNTER — Emergency Department
Admission: EM | Admit: 2021-06-16 | Discharge: 2021-06-16 | Disposition: A | Discharge status: Home / Self Care | Payer: Self-pay | Attending: Emergency Medicine | Admitting: Emergency Medicine

## 2021-06-16 DIAGNOSIS — X501XXD Overexertion from prolonged static or awkward postures, subsequent encounter: Secondary | ICD-10-CM | POA: Insufficient documentation

## 2021-06-16 DIAGNOSIS — S8991XD Unspecified injury of right lower leg, subsequent encounter: Secondary | ICD-10-CM | POA: Insufficient documentation

## 2021-06-16 NOTE — Discharge Instructions (Addendum)
Please use ibuprofen (Motrin) up to 800 mg every 8 hours, naproxen (Naprosyn) up to 500 mg every 12 hours, and/or acetaminophen (Tylenol) up to 4 g/day for any continued pain 

## 2021-06-16 NOTE — ED Triage Notes (Signed)
Pt was seen here on 3/29 for R knee injury and pain. Pt states the pain is better but she still cannot bear any weight. Pt is A&Ox4 and NAD ?

## 2021-06-16 NOTE — ED Provider Notes (Signed)
? ?[facility] ?Provider Note ? ? Event Date/Time  ? First MD Initiated Contact with Patient 06/16/21 1132   ?  (approximate) ?History  ?Knee Injury ? ?HPI ?Kaitlin Brown is a 56 y.o. female with a stated past medical history of of symptomatic anemia due to chronic blood loss who presents 4 days after a right knee injury in which she twisted on a planted foot on the left side causing anterior and lateral right knee pain with walking.  Patient states that she is able to bear more weight at this time however she feels that she cannot do her job efficiently but only got a work note for 3 days.  Patient states that she has been unable to schedule a follow-up with orthopedic surgery within 1 week for follow-up.  Patient's does not state that overall symptoms are improving except for the anterior knee pain that she states began after the injury and has been stable despite walking on it. ?Physical Exam  ?Triage Vital Signs: ?ED Triage Vitals  ?Enc Vitals Group  ?   BP 06/16/21 1135 (!) 149/95  ?   Pulse Rate 06/16/21 1135 89  ?   Resp 06/16/21 1135 18  ?   Temp 06/16/21 1135 98.3 ?F (36.8 ?C)  ?   Temp Source 06/16/21 1135 Oral  ?   SpO2 06/16/21 1135 96 %  ?   Weight 06/16/21 1142 219 lb 12.8 oz (99.7 kg)  ?   Height 06/16/21 1142 '5\' 6"'$  (1.676 m)  ?   Head Circumference --   ?   Peak Flow --   ?   Pain Score 06/16/21 1136 5  ?   Pain Loc --   ?   Pain Edu? --   ?   Excl. in [place]? --   ? ?Most recent vital signs: ?Vitals:  ? 06/16/21 1135  ?BP: (!) 149/95  ?Pulse: 89  ?Resp: 18  ?Temp: 98.3 ?F (36.8 ?C)  ?SpO2: 96%  ? ?General: Awake, oriented x4. ?CV:  Good peripheral perfusion.  ?Resp:  Normal effort.  ?Abd:  No distention.  ?Other:  Obese middle-aged African-American female laying in bed in no distress with right knee elevated on bed.  Mild anterior tenderness to palpation over the joint space just medial to the patella.  Full range of motion at the knee, hip, and ankle on the right side  without significant pain.  No paresthesias or numbness appreciated ?ED Results / Procedures / Treatments  ? ?PROCEDURES: ?Critical Care performed: No ?Procedures ?MEDICATIONS ORDERED IN ED: ?Medications - No data to display ?IMPRESSION / MDM / ASSESSMENT AND PLAN / ED COURSE  ?I reviewed the triage vital signs and the nursing notes. ?             ?               ? ?56 year old female presents 4 days after initial injury of the right knee requesting a work note and follow-up with orthopedics.  Patient received negative work-up 4 days ago with no evidence of fracture and patient able to bear weight ?Given history, exam and workup I have low suspicion for fracture, dislocation, significant ligamentous injury, septic arthritis, gout flare, new autoimmune arthropathy, or gonococcal arthropathy. ? ?Interventions: ?Work note provided ?Information for orthopedic follow-up with Dr. Arnette Schaumann who provided ?Disposition: Discharge home with strict return precautions and instructions for prompt primary care follow up in the next week. ? ?  ?FINAL CLINICAL IMPRESSION(S) / ED  DIAGNOSES  ? ?Final diagnoses:  ?Right knee injury, subsequent encounter  ? ?Rx / DC Orders  ? ?ED Discharge Orders   ? ? None  ? ?  ? ?Note:  This document was prepared using Dragon voice recognition software and may include unintentional dictation errors. ?  ?Naaman Plummer, MD ?06/16/21 1159 ? ?

## 2021-06-16 NOTE — ED Notes (Signed)
See triage note  presents with cont's pain to right knee  s/p injury  was seen about 1 week ago for injury ?

## 2023-05-12 ENCOUNTER — Other Ambulatory Visit: Payer: Self-pay

## 2023-05-12 ENCOUNTER — Encounter: Payer: Self-pay | Admitting: *Deleted

## 2023-05-12 DIAGNOSIS — K0889 Other specified disorders of teeth and supporting structures: Secondary | ICD-10-CM | POA: Diagnosis present

## 2023-05-12 DIAGNOSIS — K047 Periapical abscess without sinus: Secondary | ICD-10-CM | POA: Insufficient documentation

## 2023-05-12 DIAGNOSIS — Z79899 Other long term (current) drug therapy: Secondary | ICD-10-CM | POA: Diagnosis not present

## 2023-05-12 DIAGNOSIS — I10 Essential (primary) hypertension: Secondary | ICD-10-CM | POA: Diagnosis not present

## 2023-05-12 NOTE — ED Triage Notes (Signed)
 Pt states left upper tooth broke 2 weeks ago.  Pt taking advil without relief.   Pt alert  speech clear

## 2023-05-13 ENCOUNTER — Emergency Department
Admission: EM | Admit: 2023-05-13 | Discharge: 2023-05-13 | Disposition: A | Discharge status: Home / Self Care | Payer: Self-pay | Attending: Emergency Medicine | Admitting: Emergency Medicine

## 2023-05-13 DIAGNOSIS — K047 Periapical abscess without sinus: Secondary | ICD-10-CM

## 2023-05-13 MED ORDER — AMOXICILLIN-POT CLAVULANATE 875-125 MG PO TABS: 1.0000 | ORAL_TABLET | Freq: Two times a day (BID) | ORAL | 0 refills | Status: DC

## 2023-05-13 MED ORDER — HYDROCODONE-ACETAMINOPHEN 5-325 MG PO TABS
2.0000 | ORAL_TABLET | Freq: Once | ORAL | Status: AC
  Administered 2023-05-13: 2 via ORAL
  Filled 2023-05-13: qty 2

## 2023-05-13 MED ORDER — AMOXICILLIN-POT CLAVULANATE 875-125 MG PO TABS
1.0000 | ORAL_TABLET | Freq: Once | ORAL | Status: AC
  Administered 2023-05-13: 1 via ORAL
  Filled 2023-05-13: qty 1

## 2023-05-13 MED ORDER — ONDANSETRON 4 MG PO TBDP: 4.0000 mg | ORAL_TABLET | Freq: Four times a day (QID) | ORAL | 0 refills | Status: AC | PRN

## 2023-05-13 MED ORDER — ONDANSETRON 4 MG PO TBDP
4.0000 mg | ORAL_TABLET | Freq: Once | ORAL | Status: AC
  Administered 2023-05-13: 4 mg via ORAL
  Filled 2023-05-13: qty 1

## 2023-05-13 MED ORDER — IBUPROFEN 800 MG PO TABS: 800.0000 mg | ORAL_TABLET | Freq: Three times a day (TID) | ORAL | 0 refills | Status: AC | PRN

## 2023-05-13 MED ORDER — IBUPROFEN 800 MG PO TABS
800.0000 mg | ORAL_TABLET | Freq: Once | ORAL | Status: AC
  Administered 2023-05-13: 800 mg via ORAL
  Filled 2023-05-13: qty 1

## 2023-05-13 MED ORDER — HYDROCODONE-ACETAMINOPHEN 5-325 MG PO TABS: 2.0000 | ORAL_TABLET | Freq: Three times a day (TID) | ORAL | 0 refills | Status: AC | PRN

## 2023-05-13 NOTE — ED Provider Notes (Signed)
 [facility] Provider Note    Event Date/Time   First MD Initiated Contact with Patient 05/13/23 0320     (approximate)   History   Dental Pain   HPI  Kaitlin Brown is a 58 y.o. female with history of hypertension who presents to the emergency department with complaints of left upper dental pain for the past several days.  She states that she felt an area of swelling to her gum tonight but feels like something may have popped and drained in the waiting room because now this area feels more flat.  She denies any fevers but feels like her face is swollen.  She has not followed up with her dentist.  She reports her sister drove her to the emergency department.   History provided by patient.    Past Medical History:  Diagnosis Date   Anemia due to blood loss, chronic    due to menorrhagia   Back pain    Bladder infection    Chicken pox    Fibroid uterus    Hypertension    SVT (supraventricular tachycardia) (HCC)     Past Surgical History:  Procedure Laterality Date   ABDOMINAL HYSTERECTOMY     EMBOLIZATION (CATH LAB) Bilateral 07/18/2016   Procedure: Embolization, uterine arteries;  Surgeon: Annice Needy, MD;  Location: [facility] INVASIVE CV LAB;  Service: Cardiovascular;  Laterality: Bilateral;   HYSTEROSCOPY WITH D & C  10/2014   [facility], Dr Crista Curb   none      MEDICATIONS:  Prior to Admission medications   Medication Sig Start Date End Date Taking? Authorizing Provider  amLODipine (NORVASC) 10 MG tablet Take 1 tablet by mouth daily. 05/08/20   [provider]  DULoxetine (CYMBALTA) 30 MG capsule Take 1 capsule by mouth daily. 05/09/20 05/09/21  [provider]  lisinopril (ZESTRIL) 20 MG tablet Take 20 mg by mouth daily. 09/21/19 06/12/21  [provider]  metoprolol tartrate (LOPRESSOR) 50 MG tablet Take 100 mg by mouth 2 (two) times daily. 09/21/19   [provider]  pregabalin (LYRICA) 25 MG capsule Take by mouth.  04/19/20 04/19/21  [provider]  spironolactone (ALDACTONE) 25 MG tablet Take 25 mg by mouth daily. 09/21/19 06/12/21  [provider]    Physical Exam   Triage Vital Signs: ED Triage Vitals  Encounter Vitals Group     BP 05/12/23 2236 (!) 182/102     Systolic BP Percentile --      Diastolic BP Percentile --      Pulse Rate 05/12/23 2236 75     Resp 05/12/23 2236 18     Temp 05/12/23 2236 97.8 F (36.6 C)     Temp Source 05/12/23 2236 Oral     SpO2 05/12/23 2236 95 %     Weight 05/12/23 2237 220 lb (99.8 kg)     Height 05/12/23 2237 5\' 6"  (1.676 m)     Head Circumference --      Peak Flow --      Pain Score 05/12/23 2237 8     Pain Loc --      Pain Education --      Exclude from Growth Chart --     Most recent vital signs: Vitals:   05/12/23 2236 05/13/23 0406  BP: (!) 182/102 (!) 166/95  Pulse: 75 67  Resp: 18 17  Temp: 97.8 F (36.6 C)   SpO2: 95% 96%     CONSTITUTIONAL: Alert and responds  appropriately to questions. Well-appearing; well-nourished HEAD: Normocephalic, atraumatic EYES: Conjunctivae clear, pupils appear equal ENT: normal nose; moist mucous membranes; + dental caries, small superficial abscess noted to the left upper gums that is draining pus; tongue sits flat in the bottom of the mouth, no Ludwig's angina, normal phonation, no trismus or drooling, airway patent, no significant facial swelling, redness or warmth noted NECK: Normal range of motion CARD: Regular rate and rhythm RESP: Normal chest excursion without splinting or tachypnea; no hypoxia or respiratory distress, speaking full sentences ABD/GI: non-distended EXT: Normal ROM in all joints, no major deformities noted SKIN: Normal color for age and race, no rashes on exposed skin NEURO: Moves all extremities equally, normal speech, no facial asymmetry noted PSYCH: [name] mood and manner are appropriate. Grooming and personal hygiene are appropriate.  ED Results /  Procedures / Treatments   LABS: (all labs ordered are listed, but only abnormal results are displayed) Labs Reviewed - No data to display   EKG:    RADIOLOGY: My personal review and interpretation of imaging:    I have personally reviewed all radiology reports. No results found.   PROCEDURES:  Critical Care performed: No      Procedures    IMPRESSION / MDM / ASSESSMENT AND PLAN / ED COURSE  I reviewed the triage vital signs and the nursing notes.   Patient here with dental caries, dental abscess that is draining.     DIFFERENTIAL DIAGNOSIS (includes but not limited to):   Dental caries, dental abscess, no sign of Ludwig's angina, facial cellulitis  Patient's presentation is most consistent with acute complicated illness / injury requiring diagnostic workup.  PLAN: Will start patient on antibiotics.  Abscess is already draining spontaneously.  Recommended warm salt water gargles.  Will discharge with pain medication.  Will give dental resources.  Airway patent.  No sign of Ludwig's angina.  Patient is hypertensive but suspect this is due to pain.   MEDICATIONS GIVEN IN ED: Medications  HYDROcodone-acetaminophen (NORCO/VICODIN) 5-325 MG per tablet 2 tablet (2 tablets Oral Given 05/13/23 0400)  ondansetron (ZOFRAN-ODT) disintegrating tablet 4 mg (4 mg Oral Given 05/13/23 0400)  amoxicillin-clavulanate (AUGMENTIN) 875-125 MG per tablet 1 tablet (1 tablet Oral Given 05/13/23 0400)  ibuprofen (ADVIL) tablet 800 mg (800 mg Oral Given 05/13/23 0400)     ED COURSE:  At this time, I do not feel there is any life-threatening condition present. I reviewed all nursing notes, vitals, pertinent previous records.  All lab and urine results, EKGs, imaging ordered have been independently reviewed and interpreted by myself.  I reviewed all available radiology reports from any imaging ordered this visit.  Based on my assessment, I feel the patient is safe to be discharged home without  further emergent workup and can continue workup as an outpatient as needed. Discussed all findings, treatment plan as well as usual and customary return precautions.  They verbalize understanding and are comfortable with this plan.  Outpatient follow-up has been provided as needed.  All questions have been answered.    CONSULTS:  none   OUTSIDE RECORDS REVIEWED: Reviewed last OB/GYN note at [facility] in June 2022.     FINAL CLINICAL IMPRESSION(S) / ED DIAGNOSES   Final diagnoses:  Dental infection     Rx / DC Orders   ED Discharge Orders          Ordered    HYDROcodone-acetaminophen (NORCO/VICODIN) 5-325 MG tablet  Every 8 hours PRN  05/13/23 0339    ibuprofen (ADVIL) 800 MG tablet  Every 8 hours PRN        05/13/23 0339    ondansetron (ZOFRAN-ODT) 4 MG disintegrating tablet  Every 6 hours PRN        05/13/23 0339    amoxicillin-clavulanate (AUGMENTIN) 875-125 MG tablet  2 times daily        05/13/23 1610             Note:  This document was prepared using Dragon voice recognition software and may include unintentional dictation errors.   Markel Kurtenbach, Layla Maw, DO 05/13/23 [phone]

## 2023-05-13 NOTE — Discharge Instructions (Addendum)
 You are being provided a prescription for opiates (also known as narcotics) for pain control.  Opiates can be addictive and should only be used when absolutely necessary for pain control when other alternatives do not work.  We recommend you only use them for the recommended amount of time and only as prescribed.  Please do not take with other sedative medications or alcohol.  Please do not drive, operate machinery, make important decisions while taking opiates.  Please note that these medications can be addictive and have high abuse potential.  Patients can become addicted to narcotics after only taking them for a few days.  Please keep these medications locked away from children, teenagers or any family members with history of substance abuse.  Narcotic pain medicine may also make you constipated.  You may use over-the-counter medications such as MiraLAX, Colace to prevent constipation.  If you become constipated, you may use over-the-counter enemas as needed.  Itching and nausea are also common side effects of narcotic pain medication.  If you develop uncontrolled vomiting or a rash, please stop these medications and seek medical care.  I recommend warm salt water gargles several times a day.  Please take antibiotics complete.  Please call to schedule appointment for close follow-up with a dentist.

## 2023-11-09 ENCOUNTER — Encounter: Payer: Self-pay | Admitting: Emergency Medicine

## 2023-11-09 ENCOUNTER — Emergency Department
Admission: EM | Admit: 2023-11-09 | Discharge: 2023-11-09 | Disposition: A | Discharge status: Home / Self Care | Attending: Emergency Medicine | Admitting: Emergency Medicine

## 2023-11-09 ENCOUNTER — Other Ambulatory Visit: Payer: Self-pay

## 2023-11-09 ENCOUNTER — Emergency Department

## 2023-11-09 DIAGNOSIS — I471 Supraventricular tachycardia, unspecified: Secondary | ICD-10-CM | POA: Diagnosis not present

## 2023-11-09 DIAGNOSIS — R002 Palpitations: Secondary | ICD-10-CM | POA: Diagnosis present

## 2023-11-09 LAB — CBC WITH DIFFERENTIAL/PLATELET
Abs Immature Granulocytes: 0.02 K/uL (ref 0.00–0.07)
Basophils Absolute: 0 K/uL (ref 0.0–0.1)
Basophils Relative: 0 %
Eosinophils Absolute: 0.1 K/uL (ref 0.0–0.5)
Eosinophils Relative: 1 %
HCT: 51.8 % — ABNORMAL HIGH (ref 36.0–46.0)
Hemoglobin: 16.3 g/dL — ABNORMAL HIGH (ref 12.0–15.0)
Immature Granulocytes: 0 %
Lymphocytes Relative: 40 %
Lymphs Abs: 2 K/uL (ref 0.7–4.0)
MCH: 26.4 pg (ref 26.0–34.0)
MCHC: 31.5 g/dL (ref 30.0–36.0)
MCV: 83.8 fL (ref 80.0–100.0)
Monocytes Absolute: 0.5 K/uL (ref 0.1–1.0)
Monocytes Relative: 9 %
Neutro Abs: 2.4 K/uL (ref 1.7–7.7)
Neutrophils Relative %: 50 %
Platelets: 317 K/uL (ref 150–400)
RBC: 6.18 MIL/uL — ABNORMAL HIGH (ref 3.87–5.11)
RDW: 13.5 % (ref 11.5–15.5)
WBC: 4.9 K/uL (ref 4.0–10.5)
nRBC: 0 % (ref 0.0–0.2)

## 2023-11-09 LAB — BASIC METABOLIC PANEL WITH GFR
Anion gap: 15 (ref 5–15)
BUN: 17 mg/dL (ref 6–20)
CO2: 22 mmol/L (ref 22–32)
Calcium: 9.6 mg/dL (ref 8.9–10.3)
Chloride: 104 mmol/L (ref 98–111)
Creatinine, Ser: 0.84 mg/dL (ref 0.44–1.00)
GFR, Estimated: >60 mL/min (ref >60)
Glucose, Bld: 179 mg/dL — ABNORMAL HIGH (ref 70–99)
Potassium: 3.9 mmol/L (ref 3.5–5.1)
Sodium: 141 mmol/L (ref 135–145)

## 2023-11-09 LAB — MAGNESIUM: Magnesium: 2 mg/dL (ref 1.7–2.4)

## 2023-11-09 NOTE — ED Notes (Signed)
 Called [facility] and added pt to monitoring board

## 2023-11-09 NOTE — ED Notes (Signed)
Pt converted to NSR

## 2023-11-09 NOTE — ED Triage Notes (Addendum)
 Pt reports palpitations after getting worked up. Pt reports she has taken 100mg  metoprolol  prior to arrival. Pt has hx of SVT. Denies CP or SHOB.

## 2023-11-09 NOTE — ED Provider Notes (Signed)
 [facility] Provider Note    Event Date/Time   First MD Initiated Contact with Patient 11/09/23 [phone]     (approximate)   History   Chief Complaint Palpitations   HPI  Kaitlin Brown is a 58 y.o. female with past medical history of hypertension, SVT, DVT, and anemia who presents to the ED complaining of palpitations.  Patient reports that she got in an argument earlier this morning and about 30 minutes prior to arrival, suddenly felt like her heart was racing.  She describes some mild difficulty breathing with this, denies any pain in her chest.  She has not felt like she is going to pass out and states she had felt fine when she first woke up in the morning.  She denies any recent fevers, cough, nausea, vomiting, or diarrhea.  She reports previous episodes of SVT that have felt similar, has had electrolyte issues in the past.  She took her usual morning dose of 100 mg of metoprolol  prior to arrival.     Physical Exam   Triage Vital Signs: ED Triage Vitals  Encounter Vitals Group     BP 11/09/23 0902 (!) 144/104     Girls Systolic BP Percentile --      Girls Diastolic BP Percentile --      Boys Systolic BP Percentile --      Boys Diastolic BP Percentile --      Pulse Rate 11/09/23 0902 (!) 174     Resp 11/09/23 0902 (!) 22     Temp 11/09/23 0902 98.3 F (36.8 C)     Temp Source 11/09/23 0902 Oral     SpO2 11/09/23 0902 100 %     Weight --      Height --      Head Circumference --      Peak Flow --      Pain Score 11/09/23 0901 0     Pain Loc --      Pain Education --      Exclude from Growth Chart --     Most recent vital signs: Vitals:   11/09/23 1000 11/09/23 1035  BP: (!) 164/109 (!) 154/110  Pulse: 88 85  Resp: 14 14  Temp:    SpO2: 97% 99%    Constitutional: Alert and oriented. Eyes: Conjunctivae are normal. Head: Atraumatic. Nose: No congestion/rhinnorhea. Mouth/Throat: Mucous membranes are moist.  Cardiovascular:  Tachycardic, regular rhythm. Grossly normal heart sounds.  2+ radial pulses bilaterally. Respiratory: Normal respiratory effort.  No retractions. Lungs CTAB. Gastrointestinal: Soft and nontender. No distention. Musculoskeletal: No lower extremity tenderness nor edema.  Neurologic:  Normal speech and language. No gross focal neurologic deficits are appreciated.    ED Results / Procedures / Treatments   Labs (all labs ordered are listed, but only abnormal results are displayed) Labs Reviewed  CBC WITH DIFFERENTIAL/PLATELET - Abnormal; Notable for the following components:      Result Value   RBC 6.18 (*)    Hemoglobin 16.3 (*)    HCT 51.8 (*)    All other components within normal limits  BASIC METABOLIC PANEL WITH GFR - Abnormal; Notable for the following components:   Glucose, Bld 179 (*)    All other components within normal limits  MAGNESIUM      EKG  ED ECG REPORT I, Carlin Palin, the attending physician, personally viewed and interpreted this ECG.   Date: 11/09/2023  EKG Time: 9:04  Rate: 168  Rhythm: SVT  Axis:  Normal  Intervals:none  ST&T Change: None  ED ECG REPORT I, Carlin Palin, the attending physician, personally viewed and interpreted this ECG.   Date: 11/09/2023  EKG Time: 9:07  Rate: 102  Rhythm: sinus tachycardia  Axis: Normal  Intervals:none  ST&T Change: None   RADIOLOGY Chest x-ray reviewed and interpreted by me with no infiltrate, edema, or effusion.  PROCEDURES:  Critical Care performed: No  Procedures   MEDICATIONS ORDERED IN ED: Medications - No data to display   IMPRESSION / MDM / ASSESSMENT AND PLAN / ED COURSE  I reviewed the triage vital signs and the nursing notes.                              58 y.o. female with past medical history of hypertension, SVT, DVT, and anemia presents to the ED complaining of sudden onset palpitations earlier this morning after she had gotten into an argument.  Patient's presentation is  most consistent with acute presentation with potential threat to life or bodily function.  Differential diagnosis includes, but is not limited to, arrhythmia, ACS, PE, electrolyte abnormality, anemia.  Patient nontoxic-appearing and in no acute distress, vital signs initially remarkable for tachycardia into the 170s, otherwise reassuring.  EKG consistent with SVT, no ischemic changes noted.  Patient converted to a normal sinus rhythm following vagal maneuvers, follow-up EKG also without ischemic changes.  We will observe on cardiac monitor and screen labs.  Chest x-ray is unremarkable and labs are reassuring with no significant anemia, leukocytosis, electrolyte abnormality, or AKI.  Magnesium  level within normal limits and patient has remained in normal sinus rhythm over about 2 hours of observation.  Her symptoms have resolved and she is appropriate for discharge home with outpatient follow-up with cardiology, requests referral to establish care locally.  She was counseled to continue metoprolol  and to return to the ED for new or worsening symptoms, patient agrees with plan.      FINAL CLINICAL IMPRESSION(S) / ED DIAGNOSES   Final diagnoses:  SVT (supraventricular tachycardia) (HCC)     Rx / DC Orders   ED Discharge Orders          Ordered    Ambulatory referral to Cardiology        11/09/23 1115             Note:  This document was prepared using Dragon voice recognition software and may include unintentional dictation errors.   Palin Carlin, MD 11/09/23 [phone]

## 2023-12-30 ENCOUNTER — Institutional Professional Consult (permissible substitution): Admitting: Cardiology

## 2024-03-17 NOTE — ED Provider Notes (Signed)
 [facility] Emergency Department Provider Note   ED Clinical Impression   Final diagnoses:  Localized osteoarthrosis of left hip (Primary)    Impression, Medical Decision Making, ED Course   Impression: 59 y.o. female with PMH most significant for hypertension, anemia, obesity, anxiety, GERD, joint pain, and history of DVT with PE who presents with a 1 month history of nontraumatic left hip/groin pain.  Upon physical examination, the patient appears uncomfortable but nontoxic.  Her triage vital signs are notable for an elevated BP of 170/94, otherwise unremarkable.  GCS is 15.  Her pupils are +3, equal, round, and reactive to light.  Her lungs are clear and equal bilaterally with a regular cardiac rate and rhythm.  DDx/MDM: Osteoarthritis versus ligament injury versus joint deformity  Plan to obtain x-ray of left hip with pelvis and treat pain with Percocet and muscle relaxer with reevaluation to determine effectiveness.  1551 x-ray hip with pelvis left revealed mild left hip osteoarthrosis and bilateral left more than right lateral acetabular over coverage.  Shared decision making care planning was utilized with patient at bedside.  The patient notes that she has an upcoming orthopedic visit for her hip of concern January 14th, 2026.  She was provided a short course of Percocet and muscle relaxers for severe and uncontrolled pain at home until proper follow-up.  She was advised to alternate ibuprofen  and Tylenol  for mild to moderate pain.  She was advised not to consume any alcohol, operate any heavy machinery, or drive a vehicle while taking the controlled substance medications.  She was advised to continue alternating moist heat and ice for 20 minutes 2-3 times daily for alternative pain relief.  She was also provided a work excuse for the next 3 days to rest the affected area.  She was advised to return to the emergency department for any new or worsening symptoms including severe worsening  extremity pain, difficulty or inability to ambulate, numbness/tingling of the affected extremity, weakness of the affected extremity, or any other symptoms concerning to her.  Diagnostic workup as below.  Orders Placed This Encounter  Procedures   XR Hip W Pelvis Left    MDM Elements  I independently visualized the radiology images.  I reviewed the patient's prior medical records [facility] ED encounter 12/04/2022).    History   Chief Complaint Chief Complaint  Patient presents with   Hip Pain    HPI  DALEEN STEINHAUS is a 59 y.o. female with past medical history as below who presents with a 1 month history of nontraumatic left hip/groin pain.  The patient reports that she is having intermittent significant left hip/groin pain, which is mildly relieved with over-the-counter Tylenol  and Motrin .  She is also alternating heat and ice with little relief.  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  The patient cannot recall an initial injury to the left hip, but does note bilateral lower back pain upon initial employment while taking care of this patient.  Denies numbness/tingling of the affected extremity, denies weakness of the affected extremity, denies involuntary loss of control of her bowel or bladder, denies fevers. Review of Systems  A complete review of systems was performed and is negative other than as addressed in the HPI.    Past Medical History[1]  Past Surgical History[2]  Active Medications[3]   Allergies[4]  Family History[5]  Short Social History[6]   Physical Exam   ED  Triage Vitals  Enc Vitals Group     BP 03/17/24 1405 (!) 170/94     Pulse 03/17/24 1405 78     SpO2 Pulse --      Resp 03/17/24 1405 16     Temp 03/17/24 1405 36.9 C (98.4 F)     Temp src --      SpO2 03/17/24 1405 97 %     Weight 03/17/24 1403 (!) 108 kg (238 lb)     Height --       Head Circumference --      Peak Flow --      Pain Score --      Pain Loc --      Pain Education --      Exclude from Growth Chart --     Constitutional: Alert and oriented.  Uncomfortable but in no distress. Eyes: Conjunctivae are normal.  Pupils are +3, equal, round, and reactive to light. ENT      Head: Normocephalic and atraumatic.      Mouth/Throat: Mucous membranes are moist.      Neck: Supple Cardiovascular: Normal rate, regular rhythm.  Respiratory: Normal respiratory effort. Breath sounds are normal. Gastrointestinal: Soft and nontender. There is no CVA tenderness. Musculoskeletal: Nontender with normal range of motion in all extremities.  5/5 strength and motor function of bilateral upper and lower extremity with full sensation intact.      Right lower leg: No tenderness or edema.  No weakness noted.      Left lower leg: No tenderness or edema.  No weakness noted. Neurologic: Normal speech and language. No gross focal neurologic deficits are appreciated. Skin: Skin is warm, dry and intact. No rash noted. Psychiatric: Mood and affect are normal. Speech and behavior are normal.    Radiology   XR Hip W Pelvis Left  Final Result  1.  Mild left hip osteoarthrosis.  2.  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      Labs   Labs Reviewed - No data to display   Pertinent labs & imaging results that were available during my care of the patient were reviewed by me and considered in my medical decision making (see chart for details).  Portions of this record have been created using Scientist, clinical (histocompatibility and immunogenetics). Dictation errors have been sought, but may not have been identified and corrected.        [1] Past Medical History: Diagnosis Date   Abnormal uterine bleeding (AUB) 10/08/2015   - Patient has a history of heavy prolonged periods with subsequent anemia requiring first  transfusion (3u PRBC) 2 weeks ago - Patient unable to tolerate endometrial biopsy in clinic; Plan to proceed with operative hysteroscopy, possible hysteroscopic myomectomy, possible hysteroscopic polypectomy, and dilation and curettage. At this time, the patient is not interested in continuous Provera  or a L   Anemia    Anxiety    Financial difficulties 04/25/2020   Pt currently not working.    GERD (gastroesophageal reflux disease)    Gonorrhea    Remote (@ 59 yo)   Headache(784.0)    History of DVT (deep vein thrombosis)    History of pulmonary embolus (PE)    Hypertension    Intramural leiomyoma of uterus 01/25/2018   Iron  deficiency anemia 02/04/2017   Irregular menses    Joint pain    Lack of access to transportation    Obesity    Problem with  transportation    Tachycardia   [2] Past Surgical History: Procedure Laterality Date   ANKLE FRACTURE SURGERY Left    a rod was placed   ELBOW SURGERY Left    HERNIA REPAIR  2011   umbilical hernia repair   PR COLONOSCOPY FLX DX W/COLLJ SPEC WHEN PFRMD N/A 01/15/2024   Procedure: COLONOSCOPY & EGD;  Surgeon: Donnise Renella Manns, MD;  Location: [facility];  Service: Gastrointestinal   PR CYSTOURETHROSCOPY N/A 08/11/2018   Procedure: CYSTOURETHROSCOPY (SEPARATE PROCEDURE);  Surgeon: Rosaline Elias Kennedy, MD;  [facility];  Service: Advanced Laparoscopy   PR EXCISE BREAST LES W XRAY MARKER Right 05/05/2017   Procedure: EXC BREAST Needle loc  LES-ID PRE-OP PLCMT RAD MARKER; OPEN, 1 LES;  Surgeon: Corean CHRISTELLA Gong, MD;  Location[facility];  Service: Surgical Oncology Breast   PR HYSTEROSCOPY,W/ENDO BX N/A 10/18/2015   Procedure: HYSTEROSCOPY, SURGICAL; WITH SAMPLING (BIOPSY) OF ENDOMETRIUM &/OR POLYPECTOMY, W/WO D&C;  Surgeon: Ozell Larkin Model, MD;  Location: [facility] OR [facility];  Service: [facility] Gynecology   PR HYSTEROSCOPY,W/ENDO BX N/A 02/10/2018   Procedure:  HYSTEROSCOPY, SURGICAL; WITH SAMPLING (BIOPSY) OF ENDOMETRIUM &/OR POLYPECTOMY, W/WO D&C;  Surgeon: Rosaline Elias Kennedy, MD;  Location: [facility] OR [facility];  Service: Advanced Laparoscopy   PR LAP, SUPRACERVIAL HYSTERECTOMY, >250G N/A 08/11/2018   Procedure: PRIORITY LAPAROSCOPY, SURGICAL, WITH TOTAL HYSTERECTOMY, UTERUS GREATER THAN 250 G, W/REMOVAL TUBE(S) &/OR OVARY(S);  Surgeon: Rosaline Elias Kennedy, MD;  Location: [facility] OR [facility];  Service: Advanced Laparoscopy   PR UPPER GI ENDOSCOPY,BIOPSY N/A 01/15/2024   Procedure: UGI ENDOSCOPY; WITH BIOPSY, SINGLE OR MULTIPLE;  Surgeon: Donnise Renella Manns, MD;  Location: [facility];  Service: Gastrointestinal   Steel Rod    [3] Current Facility-Administered Medications  Medication Dose Route Frequency Provider Last Rate Last Admin   cyclobenzaprine  (FLEXERIL ) tablet 10 mg  10 mg Oral Once Rowles, Dena Garre, FNP       oxyCODONE -acetaminophen  (PERCOCET) 5-325 mg tablet 1 tablet  1 tablet Oral Once Rowles, Dena Garre, FNP       Current Outpatient Medications  Medication Sig Dispense Refill   metoPROLOL  tartrate (LOPRESSOR ) 50 MG tablet TAKE 1 TABLET (50 MG TOTAL) BY MOUTH TWO (2) TIMES A DAY. APPT NEEDED 180 tablet 1   omeprazole (PRILOSEC) 40 MG capsule Take 1 capsule (40 mg total) by mouth daily. 30 capsule 2  [4] Allergies Allergen Reactions   Latex, Natural Rubber Itching  [5] Family History Problem Relation Age of Onset   Diabetes Mother    Hypertension Mother    Breast cancer Mother 23   Cancer Mother    Alcohol abuse Mother    Stroke Mother    Thyroid  disease Mother    Alcohol abuse Father    Diabetes Sister    Depression Brother   [6] Social History Tobacco Use   Smoking status: Never    Passive exposure: Never   Smokeless tobacco: Never  Vaping Use   Vaping status: Never Used  Substance Use Topics   Alcohol use: Not Currently   Drug use: Not Currently    Types: Marijuana   Arty Dena Garre, FNP 03/17/24 1629

## 2024-03-25 ENCOUNTER — Emergency Department: Admission: EM | Admit: 2024-03-25 | Discharge: 2024-03-26 | Disposition: A | Discharge status: Home / Self Care

## 2024-03-25 ENCOUNTER — Emergency Department

## 2024-03-25 ENCOUNTER — Other Ambulatory Visit: Payer: Self-pay

## 2024-03-25 DIAGNOSIS — R0602 Shortness of breath: Secondary | ICD-10-CM | POA: Diagnosis not present

## 2024-03-25 DIAGNOSIS — R079 Chest pain, unspecified: Secondary | ICD-10-CM | POA: Diagnosis not present

## 2024-03-25 DIAGNOSIS — S46811A Strain of other muscles, fascia and tendons at shoulder and upper arm level, right arm, initial encounter: Secondary | ICD-10-CM | POA: Diagnosis not present

## 2024-03-25 DIAGNOSIS — X500XXA Overexertion from strenuous movement or load, initial encounter: Secondary | ICD-10-CM | POA: Insufficient documentation

## 2024-03-25 DIAGNOSIS — M546 Pain in thoracic spine: Secondary | ICD-10-CM | POA: Insufficient documentation

## 2024-03-25 DIAGNOSIS — S4991XA Unspecified injury of right shoulder and upper arm, initial encounter: Secondary | ICD-10-CM | POA: Diagnosis present

## 2024-03-25 DIAGNOSIS — I10 Essential (primary) hypertension: Secondary | ICD-10-CM | POA: Diagnosis not present

## 2024-03-25 DIAGNOSIS — T148XXA Other injury of unspecified body region, initial encounter: Secondary | ICD-10-CM

## 2024-03-25 LAB — CBC
HCT: 46.4 % — ABNORMAL HIGH (ref 36.0–46.0)
Hemoglobin: 14.8 g/dL (ref 12.0–15.0)
MCH: 27.1 pg (ref 26.0–34.0)
MCHC: 31.9 g/dL (ref 30.0–36.0)
MCV: 85 fL (ref 80.0–100.0)
Platelets: 314 K/uL (ref 150–400)
RBC: 5.46 MIL/uL — ABNORMAL HIGH (ref 3.87–5.11)
RDW: 13.8 % (ref 11.5–15.5)
WBC: 5.2 K/uL (ref 4.0–10.5)
nRBC: 0 % (ref 0.0–0.2)

## 2024-03-25 LAB — BASIC METABOLIC PANEL WITH GFR
Anion gap: 11 (ref 5–15)
BUN: 10 mg/dL (ref 6–20)
CO2: 27 mmol/L (ref 22–32)
Calcium: 9.2 mg/dL (ref 8.9–10.3)
Chloride: 104 mmol/L (ref 98–111)
Creatinine, Ser: 0.82 mg/dL (ref 0.44–1.00)
GFR, Estimated: >60 mL/min
Glucose, Bld: 102 mg/dL — ABNORMAL HIGH (ref 70–99)
Potassium: 3.8 mmol/L (ref 3.5–5.1)
Sodium: 142 mmol/L (ref 135–145)

## 2024-03-25 MED ORDER — METHOCARBAMOL 500 MG PO TABS: 500.0000 mg | ORAL_TABLET | Freq: Four times a day (QID) | ORAL | 0 refills | Status: AC

## 2024-03-25 MED ORDER — IOHEXOL 350 MG/ML SOLN
100.0000 mL | Freq: Once | INTRAVENOUS | Status: AC | PRN
  Administered 2024-03-25: 100 mL via INTRAVENOUS

## 2024-03-25 MED ORDER — LIDOCAINE 5 % EX PTCH: 1.0000 | MEDICATED_PATCH | CUTANEOUS | 0 refills | Status: AC

## 2024-03-25 MED ORDER — METHOCARBAMOL 500 MG PO TABS
750.0000 mg | ORAL_TABLET | Freq: Once | ORAL | Status: AC
  Administered 2024-03-25: 750 mg via ORAL
  Filled 2024-03-25: qty 2

## 2024-03-25 MED ORDER — LIDOCAINE 5 % EX PTCH
1.0000 | MEDICATED_PATCH | CUTANEOUS | Status: DC
  Administered 2024-03-25: 1 via TRANSDERMAL
  Filled 2024-03-25: qty 1

## 2024-03-25 NOTE — ED Triage Notes (Addendum)
 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 Pain increased with movement and deep breaths. States she feels like she cannot take a deep breath. Pain is non-radiating. States this feels similar to her previous anxiety episodes. Hx PE. Taking Advil  with relief.

## 2024-03-25 NOTE — ED Provider Notes (Incomplete)
 "  [facility] Provider Note    Event Date/Time   First MD Initiated Contact with Patient 03/25/24 2153     (approximate)   History   Back Pain   HPI  Kaitlin Brown is a 59 y.o. female  MH most significant for hypertension, anemia, obesity, anxiety, GERD, joint pain, and history of DVT with PE who presents with a 1 mo        Physical Exam   Triage Vital Signs: ED Triage Vitals  Encounter Vitals Group     BP 03/25/24 2026 (!) 159/102     Girls Systolic BP Percentile --      Girls Diastolic BP Percentile --      Boys Systolic BP Percentile --      Boys Diastolic BP Percentile --      Pulse Rate 03/25/24 2025 85     Resp 03/25/24 2025 18     Temp 03/25/24 2025 98.2 F (36.8 C)     Temp Source 03/25/24 2025 Oral     SpO2 03/25/24 2025 96 %     Weight 03/25/24 2025 238 lb (108 kg)     Height 03/25/24 2025 5' 6 (1.676 m)     Head Circumference --      Peak Flow --      Pain Score 03/25/24 2025 10     Pain Loc --      Pain Education --      Exclude from Growth Chart --     Most recent vital signs: Vitals:   03/25/24 2025 03/25/24 2026  BP:  (!) 159/102  Pulse: 85   Resp: 18   Temp: 98.2 F (36.8 C)   SpO2: 96%     Nursing Triage Note reviewed. Vital signs reviewed and patients oxygen saturation is normoxic***  General: Patient is well nourished, well developed, awake and alert, resting comfortably in no acute distress Head: Normocephalic and atraumatic Eyes: Normal inspection, extraocular muscles intact, no conjunctival pallor Ear, nose, throat: Normal external exam Neck: Normal range of motion Respiratory: Patient is in no respiratory distress, lungs CTAB Cardiovascular: Patient is not tachycardic, RRR without murmur appreciated GI: Abd SNT with no guarding or rebound  Back: Normal inspection of the back with good strength and range of motion throughout all ext Extremities: pulses intact with good cap refills, no LE  pitting edema or calf tenderness Neuro: The patient is alert and oriented to person, place, and time, appropriately conversive, with 5/5 bilat UE/LE strength, no gross motor or sensory defects noted. Coordination appears to be adequate. Skin: Warm, dry, and intact Psych: normal mood and affect, no SI or HI  ED Results / Procedures / Treatments   Labs (all labs ordered are listed, but only abnormal results are displayed) Labs Reviewed  BASIC METABOLIC PANEL WITH GFR - Abnormal; Notable for the following components:      Result Value   Glucose, Bld 102 (*)    All other components within normal limits  CBC - Abnormal; Notable for the following components:   RBC 5.46 (*)    HCT 46.4 (*)    All other components within normal limits     EKG   RADIOLOGY ***    PROCEDURES:  Critical Care performed: {CriticalCareYesNo:19197::Yes, see critical care procedure note(s),No}  Procedures   MEDICATIONS ORDERED IN ED: Medications - No data to display   IMPRESSION / MDM / ASSESSMENT AND PLAN / ED COURSE  Differential diagnosis includes, but is not limited to, ***    ***     -- Risk: 5 This patient has a high risk of morbidity due to further diagnostic testing or treatment. Rationale: This patients evaluation and management involve a high risk of morbidity due to the potential severity of presenting symptoms, need for diagnostic testing, and/or initiation of treatment that may require close monitoring. The differential includes conditions with potential for significant deterioration or requiring escalation of care. Treatment decisions in the ED, including medication administration, procedural interventions, or disposition planning, reflect this level of risk. COPA: 5 The patient has the following acute or chronic illness/injury that poses a possible threat to life or bodily function: [X] : The patient has a potentially serious acute condition or  an acute exacerbation of a chronic illness requiring urgent evaluation and management in the Emergency Department. The clinical presentation necessitates immediate consideration of life-threatening or function-threatening diagnoses, even if they are ultimately ruled out.   FINAL CLINICAL IMPRESSION(S) / ED DIAGNOSES   Final diagnoses:  None     Rx / DC Orders   ED Discharge Orders     None        Note:  This document was prepared using Dragon voice recognition software and may include unintentional dictation errors. "

## 2024-03-25 NOTE — ED Provider Notes (Signed)
 "  [facility] Provider Note    Event Date/Time   First MD Initiated Contact with Patient 03/25/24 2153     (approximate)   History   Back Pain   HPI  Kaitlin Brown is a 59 y.o. female  MH most significant for hypertension, anemia, obesity, anxiety, GERD, joint pain, and history of DVT with PE who presents with 1 day of right scapular pain and chest pain with associated shortness of breath.  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  She states pain is in her left shoulder and in her front of the chest.  She is worried that she may have a repeat blood clot as pain is similar to her previous blood clot.  She does report pain is worse when she takes a deep breath.  Denies any numbness in any of her extremities, any difficulty walking, any saddle anesthesia.  Has no fevers or chills.     Physical Exam   Triage Vital Signs: ED Triage Vitals  Encounter Vitals Group     BP 03/25/24 2026 (!) 159/102     Girls Systolic BP Percentile --      Girls Diastolic BP Percentile --      Boys Systolic BP Percentile --      Boys Diastolic BP Percentile --      Pulse Rate 03/25/24 2025 85     Resp 03/25/24 2025 18     Temp 03/25/24 2025 98.2 F (36.8 C)     Temp Source 03/25/24 2025 Oral     SpO2 03/25/24 2025 96 %     Weight 03/25/24 2025 238 lb (108 kg)     Height 03/25/24 2025 5' 6 (1.676 m)     Head Circumference --      Peak Flow --      Pain Score 03/25/24 2025 10     Pain Loc --      Pain Education --      Exclude from Growth Chart --     Most recent vital signs: Vitals:   03/25/24 2025 03/25/24 2026  BP:  (!) 159/102  Pulse: 85   Resp: 18   Temp: 98.2 F (36.8 C)   SpO2: 96%     Nursing Triage Note reviewed. Vital signs reviewed and patients oxygen saturation is normoxic  General: Patient is well nourished, well developed, awake and  alert, resting comfortably in no acute distress Head: Normocephalic and atraumatic Eyes: Normal inspection, extraocular muscles intact, no conjunctival pallor Ear, nose, throat: Normal external exam Neck: Normal range of motion Respiratory: Patient is in no respiratory distress, lungs CTAB Cardiovascular: Patient is not tachycardic, RRR without murmur appreciated GI: Abd SNT with no guarding or rebound  Back: Normal inspection of the back with good strength and range of motion throughout all ext Patient is tender to palpation over right trapezius, there is no CT or L-spine tenderness to palpation Extremities: pulses intact with good cap refills, no LE pitting edema or calf tenderness Patient able to straight leg lift bilaterally without pain Neuro: The patient is alert and oriented to person, place, and time, appropriately conversive, with 5/5 bilat UE/LE strength, no gross motor or sensory defects noted. Coordination appears to be adequate. Skin: Warm, dry, and intact Psych: normal mood and affect, no SI or HI  ED Results / Procedures / Treatments   Labs (  all labs ordered are listed, but only abnormal results are displayed) Labs Reviewed  BASIC METABOLIC PANEL WITH GFR - Abnormal; Notable for the following components:      Result Value   Glucose, Bld 102 (*)    All other components within normal limits  CBC - Abnormal; Notable for the following components:   RBC 5.46 (*)    HCT 46.4 (*)    All other components within normal limits     EKG EKG and rhythm strip are interpreted by myself:   EKG: [Normal sinus rhythm] at heart rate of 80, normal QRS duration, QTc 456, nonspecific ST segments and T waves no ectopy EKG not consistent with Acute STEMI Rhythm strip: NSR in lead II   RADIOLOGY Cxr: No acute abnormality on my independent review interpretation radiologist agrees CTA chest PE: No acute abnormality on my independent review interpretation radiologist  agrees    PROCEDURES:  Critical Care performed: No  Procedures   MEDICATIONS ORDERED IN ED: Medications  lidocaine  (LIDODERM ) 5 % 1 patch (1 patch Transdermal Patch Applied 03/25/24 2335)  methocarbamol  (ROBAXIN ) tablet 750 mg (750 mg Oral Given 03/25/24 2334)  iohexol  (OMNIPAQUE ) 350 MG/ML injection 100 mL (100 mLs Intravenous Contrast Given 03/25/24 2324)     IMPRESSION / MDM / ASSESSMENT AND PLAN / ED COURSE                                Differential diagnosis includes, but is not limited to, muscle spasm/strain, PE, pneumonia, electrolyte derangement anemia  ED course: Patient is well-appearing and satting well on room air and not tachycardic however given her prior history of PE and her age, she cannot PERC out by criteria.  I am concerned that she is stating that this pain is similar to her prior presentations of PE.  Likely she had no profound anemia or leukocytosis or acute renal insufficiency.  Chest x-ray was unremarkable and EKG demonstrated no acute ischemia or abnormality.  CT PE was unremarkable.  I do not think her presentation today is consistent with acute ACS.  Patient felt reassured.  She felt much improved with the Lidoderm  patch and methocarbamol .  She feels comfortable returning home at this time   Clinical Course as of 03/26/24 0027  [place] Mar 25, 2024  2343 Patient reassured and feels improved and feels comfortable returning home.  Will send her with scripts for Lidocaine  patches and methocarbamol  [HD]    Clinical Course User Index [HD] Nicholaus Rolland BRAVO, MD   At time of discharge there is no evidence of acute life, limb, vision, or fertility threat. Patient has stable vital signs, pain is well controlled, patient is ambulatory and p.o. tolerant.  Discharge instructions were completed using the EPIC system. I would refer you to those at this time. All warnings prescriptions follow-up etc. were discussed in detail with the patient. Patient indicates understanding  and is agreeable with this plan. All questions answered.  Patient is made aware that they may return to the emergency department for any worsening or new condition or for any other emergency.   -- Risk: 5 This patient has a high risk of morbidity due to further diagnostic testing or treatment. Rationale: This patients evaluation and management involve a high risk of morbidity due to the potential severity of presenting symptoms, need for diagnostic testing, and/or initiation of treatment that may require close monitoring. The differential includes conditions with potential for  significant deterioration or requiring escalation of care. Treatment decisions in the ED, including medication administration, procedural interventions, or disposition planning, reflect this level of risk. COPA: 5 The patient has the following acute or chronic illness/injury that poses a possible threat to life or bodily function: [X] : The patient has a potentially serious acute condition or an acute exacerbation of a chronic illness requiring urgent evaluation and management in the Emergency Department. The clinical presentation necessitates immediate consideration of life-threatening or function-threatening diagnoses, even if they are ultimately ruled out.   FINAL CLINICAL IMPRESSION(S) / ED DIAGNOSES   Final diagnoses:  Acute right-sided thoracic back pain  Shortness of breath  Muscle strain     Rx / DC Orders   ED Discharge Orders          Ordered    lidocaine  (LIDODERM ) 5 %  Every 24 hours        03/25/24 2344    methocarbamol  (ROBAXIN ) 500 MG tablet  4 times daily        03/25/24 2344             Note:  This document was prepared using Dragon voice recognition software and may include unintentional dictation errors.   Nicholaus Rolland BRAVO, MD 03/26/24 [phone]  "

## 2024-03-25 NOTE — Discharge Instructions (Signed)

## 2024-03-31 ENCOUNTER — Encounter: Payer: Self-pay | Admitting: *Deleted

## 2024-03-31 ENCOUNTER — Other Ambulatory Visit: Payer: Self-pay

## 2024-03-31 ENCOUNTER — Emergency Department

## 2024-03-31 ENCOUNTER — Emergency Department
Admission: EM | Admit: 2024-03-31 | Discharge: 2024-03-31 | Disposition: A | Discharge status: Home / Self Care | Attending: Emergency Medicine | Admitting: Emergency Medicine

## 2024-03-31 DIAGNOSIS — I10 Essential (primary) hypertension: Secondary | ICD-10-CM | POA: Diagnosis not present

## 2024-03-31 DIAGNOSIS — J0100 Acute maxillary sinusitis, unspecified: Secondary | ICD-10-CM | POA: Insufficient documentation

## 2024-03-31 DIAGNOSIS — R030 Elevated blood-pressure reading, without diagnosis of hypertension: Secondary | ICD-10-CM

## 2024-03-31 DIAGNOSIS — R519 Headache, unspecified: Secondary | ICD-10-CM | POA: Diagnosis present

## 2024-03-31 LAB — CBC WITH DIFFERENTIAL/PLATELET
Abs Immature Granulocytes: 0.01 K/uL (ref 0.00–0.07)
Basophils Absolute: 0 K/uL (ref 0.0–0.1)
Basophils Relative: 0 %
Eosinophils Absolute: 0 K/uL (ref 0.0–0.5)
Eosinophils Relative: 0 %
HCT: 48 % — ABNORMAL HIGH (ref 36.0–46.0)
Hemoglobin: 15.5 g/dL — ABNORMAL HIGH (ref 12.0–15.0)
Immature Granulocytes: 0 %
Lymphocytes Relative: 35 %
Lymphs Abs: 1.6 K/uL (ref 0.7–4.0)
MCH: 26.9 pg (ref 26.0–34.0)
MCHC: 32.3 g/dL (ref 30.0–36.0)
MCV: 83.2 fL (ref 80.0–100.0)
Monocytes Absolute: 0.5 K/uL (ref 0.1–1.0)
Monocytes Relative: 11 %
Neutro Abs: 2.6 K/uL (ref 1.7–7.7)
Neutrophils Relative %: 54 %
Platelets: 278 K/uL (ref 150–400)
RBC: 5.77 MIL/uL — ABNORMAL HIGH (ref 3.87–5.11)
RDW: 13.5 % (ref 11.5–15.5)
WBC: 4.8 K/uL (ref 4.0–10.5)
nRBC: 0 % (ref 0.0–0.2)

## 2024-03-31 LAB — BASIC METABOLIC PANEL WITH GFR
Anion gap: 13 (ref 5–15)
BUN: 7 mg/dL (ref 6–20)
CO2: 23 mmol/L (ref 22–32)
Calcium: 9 mg/dL (ref 8.9–10.3)
Chloride: 103 mmol/L (ref 98–111)
Creatinine, Ser: 0.63 mg/dL (ref 0.44–1.00)
GFR, Estimated: >60 mL/min
Glucose, Bld: 92 mg/dL (ref 70–99)
Potassium: 3.8 mmol/L (ref 3.5–5.1)
Sodium: 139 mmol/L (ref 135–145)

## 2024-03-31 LAB — SEDIMENTATION RATE: Sed Rate: 3 mm/h (ref 0–30)

## 2024-03-31 LAB — RESP PANEL BY RT-PCR (RSV, FLU A&B, COVID)  RVPGX2
Influenza A by PCR: NEGATIVE
Influenza B by PCR: NEGATIVE
Resp Syncytial Virus by PCR: NEGATIVE
SARS Coronavirus 2 by RT PCR: NEGATIVE

## 2024-03-31 LAB — GROUP A STREP BY PCR: Group A Strep by PCR: NOT DETECTED

## 2024-03-31 MED ORDER — CLONIDINE HCL 0.1 MG PO TABS
0.1000 mg | ORAL_TABLET | Freq: Once | ORAL | Status: AC
  Administered 2024-03-31: 0.1 mg via ORAL
  Filled 2024-03-31: qty 1

## 2024-03-31 MED ORDER — OXYCODONE-ACETAMINOPHEN 5-325 MG PO TABS
1.0000 | ORAL_TABLET | Freq: Once | ORAL | Status: AC
  Administered 2024-03-31: 1 via ORAL
  Filled 2024-03-31: qty 1

## 2024-03-31 MED ORDER — AMOXICILLIN 875 MG PO TABS: 875.0000 mg | ORAL_TABLET | Freq: Two times a day (BID) | ORAL | 0 refills | Status: AC

## 2024-03-31 NOTE — ED Triage Notes (Addendum)
 Pt to triage via wheelchair.  Pt has a sore throat, headache, chills. Sx 2 days.   Pt alert.  BP elevated.  Pt reports taking meds today.    Pt denies chest pain or sob.

## 2024-03-31 NOTE — ED Provider Notes (Signed)
 "  [facility] Provider Note    Event Date/Time   First MD Initiated Contact with Patient 03/31/24 1915     (approximate)   History   Sore Throat   HPI  Kaitlin Brown is a 59 y.o. female history of hypertension, resents emergency department complaining of sore throat, headache, chills for 2 days.  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  States now she has a really bad headache.  Did take her blood pressure medication this morning.  States that the worst headache she has ever had.  States her blood pressures never been this high.  Denies fever.  Denies vomiting.  Patient also complaining of joint pain.  States hip pain is severe.  Makes her knees hurt and her shoulders hurt also.  Has an appoint with orthopedics to have an injection in her hip      Physical Exam   Triage Vital Signs: ED Triage Vitals  Encounter Vitals Group     BP 03/31/24 1703 (!) 199/120     Girls Systolic BP Percentile --      Girls Diastolic BP Percentile --      Boys Systolic BP Percentile --      Boys Diastolic BP Percentile --      Pulse Rate 03/31/24 1659 100     Resp 03/31/24 1659 18     Temp 03/31/24 1659 99.1 F (37.3 C)     Temp Source 03/31/24 1659 Oral     SpO2 03/31/24 1659 96 %     Weight 03/31/24 1700 238 lb (108 kg)     Height 03/31/24 1700 5' 6 (1.676 m)     Head Circumference --      Peak Flow --      Pain Score 03/31/24 1700 10     Pain Loc --      Pain Education --      Exclude from Growth Chart --     Most recent vital signs: Vitals:   03/31/24 1703 03/31/24 2048  BP: (!) 199/120 (!) 184/116  Pulse:    Resp:    Temp:    SpO2:       General: Awake, no distress.   CV:  Good peripheral perfusion. regular rate and  rhythm Resp:  Normal effort. Lungs cta Abd:  No distention.   Other:  PERRL, EOMI, cranial nerves II through XII grossly intact.  Patient is tearful.  Dental infection noted above  the left incisor, neck is supple, no lymphadenopathy, neurovascular is intact   ED Results / Procedures / Treatments   Labs (all labs ordered are listed, but only abnormal results are displayed) Labs Reviewed  CBC WITH DIFFERENTIAL/PLATELET - Abnormal; Notable for the following components:      Result Value   RBC 5.77 (*)    Hemoglobin 15.5 (*)    HCT 48.0 (*)    All other components within normal limits  RESP PANEL BY RT-PCR (RSV, FLU A&B, COVID)  RVPGX2  GROUP A STREP BY PCR  BASIC METABOLIC PANEL WITH GFR  SEDIMENTATION RATE     EKG     RADIOLOGY CT head due to severe headache    PROCEDURES:   Procedures  Critical Care:  no Chief Complaint  Patient presents with   Sore Throat      MEDICATIONS ORDERED IN ED: Medications  oxyCODONE -acetaminophen  (PERCOCET/ROXICET) 5-325 MG per tablet 1 tablet (1 tablet Oral Given 03/31/24 2109)  cloNIDine  (CATAPRES ) tablet 0.1 mg (0.1 mg Oral Given 03/31/24 2110)     IMPRESSION / MDM / ASSESSMENT AND PLAN / ED COURSE  I reviewed the triage vital signs and the nursing notes.                              Differential diagnosis includes, but is not limited to, subdural, SAH, CVA, hypertension, headache associated with hypertension, dental abscess, sepsis, COVID, influenza, RSV, strep throat  Patient's presentation is most consistent with acute illness / injury with system symptoms.   Medications given: Clonidine  0.1 mg, Percocet 1 p.o.  CT head due to severe headache  Basic labs, sed rate, respiratory panel and strep test  Respiratory panel, strep test both reassuring  Basic labs and sed rate reassuring  CT of the head independent review interpretation by me as being negative for CVA, SAH, positive for sinusitis  I did explain all findings to patient.  She is feeling little better after medication.  Blood pressure has come down to 184/116.  Will have nursing staff recheck it once again prior to discharge.  She is to  follow-up with her regular doctor.  Return emergency department worsening.  Prescription for amoxicillin  due to the sinusitis noted on CT.  Discharged in stable condition.,      FINAL CLINICAL IMPRESSION(S) / ED DIAGNOSES   Final diagnoses:  Acute maxillary sinusitis, recurrence not specified  Bad headache  Elevated blood pressure reading     Rx / DC Orders   ED Discharge Orders          Ordered    amoxicillin  (AMOXIL ) 875 MG tablet  2 times daily        03/31/24 2141             Note:  This document was prepared using Dragon voice recognition software and may include unintentional dictation errors.    Gasper Devere ORN, PA-C 03/31/24 2144    Willo Dunnings, MD 03/31/24 [phone]  "

## 2024-03-31 NOTE — Discharge Instructions (Addendum)
 Follow-up with your doctor.  Take the medication as prescribed. Return emergency department if worse Monitor your blood pressure closely.  Once you are feeling better if it is still elevated you may need to see your doctor to have them reevaluate your medications

## 2024-03-31 NOTE — ED Provider Triage Note (Signed)
 Emergency Medicine Provider Triage Evaluation Note  Kaitlin Brown , a 59 y.o. female  was evaluated in triage.  Pt complains of sore throat and joint pain in hips, knees, shoulders. Also has headache and fatigue. Symptoms since the beginning of the weak.  Review of Systems  Positive: Sore throat, joint pain, headache, fatigue, dizziness, fever Negative:   Physical Exam  LMP 03/23/2017  Gen:   Awake, no distress   Resp:  Normal effort  MSK:   Moves extremities without difficulty  Other:    Medical Decision Making  Medically screening exam initiated at 4:57 PM.  Appropriate orders placed.  [facility] ([place] was informed that the remainder of the evaluation will be completed by another provider, this initial triage assessment does not replace that evaluation, and the importance of remaining in the ED until their evaluation is complete.    Cleaster Tinnie LABOR, PA-C 03/31/24 1700
